# Patient Record
Sex: Female | Born: 1980 | Race: Black or African American | Hispanic: No | Smoking: Current every day smoker
Health system: Southern US, Community
[De-identification: ages and names within clinical notes are randomized; demographics above are authoritative.]

## PROBLEM LIST (undated history)

## (undated) DIAGNOSIS — E039 Hypothyroidism, unspecified: Secondary | ICD-10-CM

## (undated) DIAGNOSIS — R51 Headache: Secondary | ICD-10-CM

## (undated) DIAGNOSIS — D509 Iron deficiency anemia, unspecified: Secondary | ICD-10-CM

## (undated) DIAGNOSIS — R519 Headache, unspecified: Secondary | ICD-10-CM

## (undated) DIAGNOSIS — I38 Endocarditis, valve unspecified: Secondary | ICD-10-CM

## (undated) HISTORY — DX: Endocarditis, valve unspecified: I38

## (undated) HISTORY — PX: CHOLECYSTECTOMY: SHX55

## (undated) HISTORY — DX: Headache, unspecified: R51.9

## (undated) HISTORY — DX: Headache: R51

---

## 2004-01-02 HISTORY — PX: TUBAL LIGATION: SHX77

## 2010-10-20 ENCOUNTER — Emergency Department: Payer: Self-pay | Admitting: Emergency Medicine

## 2011-01-27 ENCOUNTER — Emergency Department: Payer: Self-pay | Admitting: Unknown Physician Specialty

## 2011-01-28 LAB — URINALYSIS, COMPLETE
Bacteria: NONE SEEN
Nitrite: NEGATIVE
Protein: NEGATIVE
Specific Gravity: 1.029 (ref 1.003–1.030)
Squamous Epithelial: 14

## 2011-01-28 LAB — CBC
HGB: 13.9 g/dL (ref 12.0–16.0)
MCHC: 32.8 g/dL (ref 32.0–36.0)
WBC: 7.9 10*3/uL (ref 3.6–11.0)

## 2011-01-28 LAB — COMPREHENSIVE METABOLIC PANEL
Albumin: 4 g/dL (ref 3.4–5.0)
Alkaline Phosphatase: 87 U/L (ref 50–136)
Calcium, Total: 9.2 mg/dL (ref 8.5–10.1)
Co2: 27 mmol/L (ref 21–32)
Creatinine: 0.79 mg/dL (ref 0.60–1.30)
EGFR (Non-African Amer.): >60
Glucose: 86 mg/dL (ref 65–99)
Osmolality: 286 (ref 275–301)
SGPT (ALT): 18 U/L
Sodium: 144 mmol/L (ref 136–145)

## 2011-01-28 LAB — HCG, QUANTITATIVE, PREGNANCY: Beta Hcg, Quant.: <1 m[IU]/mL — ABNORMAL LOW

## 2011-02-08 ENCOUNTER — Emergency Department (HOSPITAL_COMMUNITY)
Admission: EM | Admit: 2011-02-08 | Discharge: 2011-02-09 | Disposition: A | Discharge status: Home / Self Care | Payer: Medicaid Other | Attending: Emergency Medicine | Admitting: Emergency Medicine

## 2011-02-08 ENCOUNTER — Encounter (HOSPITAL_COMMUNITY): Payer: Self-pay | Admitting: Emergency Medicine

## 2011-02-08 DIAGNOSIS — K5900 Constipation, unspecified: Secondary | ICD-10-CM | POA: Insufficient documentation

## 2011-02-08 DIAGNOSIS — S40029A Contusion of unspecified upper arm, initial encounter: Secondary | ICD-10-CM | POA: Insufficient documentation

## 2011-02-08 DIAGNOSIS — N949 Unspecified condition associated with female genital organs and menstrual cycle: Secondary | ICD-10-CM | POA: Insufficient documentation

## 2011-02-08 DIAGNOSIS — R300 Dysuria: Secondary | ICD-10-CM | POA: Insufficient documentation

## 2011-02-08 DIAGNOSIS — X58XXXA Exposure to other specified factors, initial encounter: Secondary | ICD-10-CM | POA: Insufficient documentation

## 2011-02-08 DIAGNOSIS — R1032 Left lower quadrant pain: Secondary | ICD-10-CM | POA: Insufficient documentation

## 2011-02-08 DIAGNOSIS — N898 Other specified noninflammatory disorders of vagina: Secondary | ICD-10-CM | POA: Insufficient documentation

## 2011-02-08 NOTE — ED Notes (Signed)
Pt alert, nad, c/o gen abd pain, onset 1 month ago, pt denies n/v or diarrhea, recent constipation, denies urinary c/o, denies bleeding or discharge

## 2011-02-08 NOTE — ED Notes (Signed)
Pt states being treated for bacterial vaginosis

## 2011-02-09 LAB — CBC
Hemoglobin: 13.1 g/dL (ref 12.0–15.0)
MCH: 29 pg (ref 26.0–34.0)
MCV: 86.5 fL (ref 78.0–100.0)
RBC: 4.52 MIL/uL (ref 3.87–5.11)

## 2011-02-09 LAB — GC/CHLAMYDIA PROBE AMP, GENITAL: Chlamydia, DNA Probe: NEGATIVE

## 2011-02-09 LAB — DIFFERENTIAL
Eosinophils Absolute: 0.1 10*3/uL (ref 0.0–0.7)
Eosinophils Relative: 2 % (ref 0–5)
Lymphs Abs: 2.8 10*3/uL (ref 0.7–4.0)
Monocytes Relative: 7 % (ref 3–12)
Neutrophils Relative %: 56 % (ref 43–77)

## 2011-02-09 LAB — WET PREP, GENITAL

## 2011-02-09 LAB — URINALYSIS, ROUTINE W REFLEX MICROSCOPIC
Glucose, UA: NEGATIVE mg/dL
Hgb urine dipstick: NEGATIVE
Specific Gravity, Urine: 1.038 — ABNORMAL HIGH (ref 1.005–1.030)
pH: 6 (ref 5.0–8.0)

## 2011-02-09 LAB — POCT I-STAT, CHEM 8
Calcium, Ion: 1.1 mmol/L — ABNORMAL LOW (ref 1.12–1.32)
Chloride: 108 mEq/L (ref 96–112)
HCT: 39 % (ref 36.0–46.0)
Hemoglobin: 13.3 g/dL (ref 12.0–15.0)

## 2011-02-09 LAB — URINE MICROSCOPIC-ADD ON

## 2011-02-09 LAB — POCT PREGNANCY, URINE: Preg Test, Ur: NEGATIVE

## 2011-02-09 MED ORDER — SODIUM CHLORIDE 0.9 % IV SOLN: Freq: Once | INTRAVENOUS | Status: DC

## 2011-02-09 NOTE — ED Notes (Signed)
Patient is alert and oriented x3.  She was given DC instructions and follow up visit instructions.  Patient gave verbal understanding. She was DC ambulatory under his own power to home.  V/S stable.  He was not showing any signs of distress on DC 

## 2011-02-09 NOTE — ED Provider Notes (Signed)
History     CSN: [id number]  Arrival date & time 02/08/11  2337   First MD Initiated Contact with Patient 02/09/11 0018      Chief Complaint  Patient presents with  . Abdominal Pain    (Consider location/radiation/quality/duration/timing/severity/associated sxs/prior treatment) HPI Comments: Patient has had left lower quadrant pain for over a month.  She was seen at [facility] had lab work drawn and was discharged home without a definitive diagnosis.  She followed up at [facility].  One week later she had ultrasound, lab work urine, which revealed she had a left ovarian cyst.  She was referred to OB/GYN antidotal clinic in [place].  She has not followed up.  She has not taken any over-the-counter medications on regular basis.  She is concerned now because she is still having pain.  She has not had a menstrual cycle since December and location where she had blood drawn on Wednesday in her left upper arm,  now has a hematoma  Patient is a 31 y.o. female presenting with abdominal pain. The history is provided by the patient.  Abdominal Pain The primary symptoms of the illness include abdominal pain and dysuria. The primary symptoms of the illness do not include fever, nausea, vomiting or vaginal discharge. The current episode started more than 2 days ago. The onset of the illness was gradual.  The dysuria is not associated with hematuria, frequency or urgency.  Additional symptoms associated with the illness include constipation. Symptoms associated with the illness do not include chills, urgency, hematuria, frequency or back pain.    History reviewed. No pertinent past medical history.  Past Surgical History  Procedure Date  . Tubal ligation   . Cholecystectomy     No family history on file.  History  Substance Use Topics  . Smoking status: Current Everyday Smoker -- 1.0 packs/day    Types: Cigarettes  . Smokeless tobacco: Not on file  . Alcohol Use: No    OB  History    Grav Para Term Preterm Abortions TAB SAB Ect Mult Living                  Review of Systems  Constitutional: Negative for fever and chills.  Cardiovascular: Negative for palpitations and leg swelling.  Gastrointestinal: Positive for abdominal pain and constipation. Negative for nausea and vomiting.  Genitourinary: Positive for dysuria. Negative for urgency, frequency, hematuria, flank pain and vaginal discharge.  Musculoskeletal: Negative for back pain.  Skin: Negative for wound.  Neurological: Negative for dizziness, weakness and headaches.    Allergies  Review of patient's allergies indicates no known allergies.  Home Medications   Current Outpatient Rx  Name Route Sig Dispense Refill  . METRONIDAZOLE 500 MG PO TABS Oral Take 500 mg by mouth 2 (two) times daily. For seven days.      BP 118/82  Pulse 84  Temp(Src) 98 F (36.7 C) (Oral)  Resp 16  Wt 180 lb (81.647 kg)  SpO2 99%  LMP 12/22/2010  Physical Exam  Constitutional: She is oriented to person, place, and time. She appears well-developed and well-nourished.  Eyes: Pupils are equal, round, and reactive to light.  Neck: Normal range of motion.  Cardiovascular: Normal rate.   Pulmonary/Chest: Effort normal.  Abdominal: Soft. Bowel sounds are normal. She exhibits no distension. There is no tenderness.  Genitourinary: Uterus normal. Right adnexum displays tenderness. Right adnexum displays no mass. Left adnexum displays tenderness. Left adnexum displays no mass. Vaginal discharge found.  Lymphadenopathy:       Right: No inguinal adenopathy present.       Left: No inguinal adenopathy present.  Neurological: She is alert and oriented to person, place, and time.  Skin: Skin is warm and dry.       Superficial bruising medial aspect, left upper arm.  No hematoma felt under the bruise    ED Course  Procedures (including critical care time)  Labs Reviewed  URINALYSIS, ROUTINE W REFLEX MICROSCOPIC -  Abnormal; Notable for the following:    Specific Gravity, Urine 1.038 (*)    Bilirubin Urine SMALL (*)    Ketones, ur TRACE (*)    Leukocytes, UA SMALL (*)    All other components within normal limits  WET PREP, GENITAL - Abnormal; Notable for the following:    Clue Cells Wet Prep HPF POC FEW (*)    WBC, Wet Prep HPF POC FEW (*)    All other components within normal limits  POCT I-STAT, CHEM 8 - Abnormal; Notable for the following:    Calcium, Ion 1.10 (*)    All other components within normal limits  CBC  DIFFERENTIAL  POCT PREGNANCY, URINE  URINE MICROSCOPIC-ADD ON  GC/CHLAMYDIA PROBE AMP, GENITAL   No results found.   1. Vaginal Discharge   2. Abdominal pain       MDM  During her pelvic exam, she informs me that she was treated for a positive gonorrhea test with an injection of Rocephin and by mouth azithromycin.  Last Wednesday.  She is also currently taking Flagyl for B Jene Every, NP 02/09/11 0339  Arman Filter, NP 02/09/11 [phone]  Medical screening examination/treatment/procedure(s) were performed by non-physician practitioner and as supervising physician I was immediately available for consultation/collaboration.   Sunnie Nielsen, MD 02/09/11 [phone]

## 2011-02-09 NOTE — ED Notes (Signed)
Pt c/o low abd pain, pt states she has been seen x 2 for same, seen by [facility] and [facility]. Pt dx with ovarian cyst. Pt continues to have pain.

## 2011-04-10 ENCOUNTER — Ambulatory Visit: Payer: Self-pay | Admitting: Internal Medicine

## 2011-04-10 LAB — LIPID PANEL
Cholesterol: 183 mg/dL (ref 0–200)
HDL Cholesterol: 58 mg/dL (ref 40–60)
Ldl Cholesterol, Calc: 102 mg/dL — ABNORMAL HIGH (ref 0–100)
Triglycerides: 114 mg/dL (ref 0–200)
VLDL Cholesterol, Calc: 23 mg/dL (ref 5–40)

## 2011-04-10 LAB — URINALYSIS, COMPLETE
Bacteria: NONE SEEN
Bilirubin,UR: NEGATIVE
Glucose,UR: NEGATIVE mg/dL (ref 0–75)
Ketone: NEGATIVE
Leukocyte Esterase: NEGATIVE
Nitrite: NEGATIVE
Ph: 7 (ref 4.5–8.0)
Protein: NEGATIVE
RBC,UR: 1 /HPF (ref 0–5)
Specific Gravity: 1.004 (ref 1.003–1.030)
Squamous Epithelial: 3
WBC UR: <1 /HPF (ref 0–5)

## 2011-04-10 LAB — COMPREHENSIVE METABOLIC PANEL
Albumin: 3.8 g/dL (ref 3.4–5.0)
Alkaline Phosphatase: 103 U/L (ref 50–136)
Anion Gap: 9 (ref 7–16)
BUN: 9 mg/dL (ref 7–18)
Bilirubin,Total: 0.3 mg/dL (ref 0.2–1.0)
Calcium, Total: 9 mg/dL (ref 8.5–10.1)
Chloride: 111 mmol/L — ABNORMAL HIGH (ref 98–107)
Co2: 24 mmol/L (ref 21–32)
Creatinine: 0.92 mg/dL (ref 0.60–1.30)
EGFR (African American): >60
EGFR (Non-African Amer.): >60
Glucose: 93 mg/dL (ref 65–99)
Osmolality: 285 (ref 275–301)
Potassium: 3.8 mmol/L (ref 3.5–5.1)
SGOT(AST): 20 U/L (ref 15–37)
SGPT (ALT): 20 U/L
Sodium: 144 mmol/L (ref 136–145)
Total Protein: 7.9 g/dL (ref 6.4–8.2)

## 2011-04-10 LAB — TSH: Thyroid Stimulating Horm: 0.693 u[IU]/mL

## 2011-04-10 LAB — CBC WITH DIFFERENTIAL/PLATELET
Basophil #: 0 10*3/uL (ref 0.0–0.1)
Basophil %: 0.2 %
Eosinophil #: 0.1 10*3/uL (ref 0.0–0.7)
Eosinophil %: 1 %
HCT: 40.7 % (ref 35.0–47.0)
HGB: 13.4 g/dL (ref 12.0–16.0)
Lymphocyte #: 1.8 10*3/uL (ref 1.0–3.6)
Lymphocyte %: 26.2 %
MCH: 29.6 pg (ref 26.0–34.0)
MCHC: 33 g/dL (ref 32.0–36.0)
MCV: 90 fL (ref 80–100)
Monocyte #: 0.4 10*3/uL (ref 0.0–0.7)
Monocyte %: 5.1 %
Neutrophil #: 4.6 10*3/uL (ref 1.4–6.5)
Neutrophil %: 67.5 %
Platelet: 179 10*3/uL (ref 150–440)
RBC: 4.53 10*6/uL (ref 3.80–5.20)
RDW: 13.9 % (ref 11.5–14.5)
WBC: 6.9 10*3/uL (ref 3.6–11.0)

## 2011-05-09 ENCOUNTER — Ambulatory Visit: Payer: Self-pay | Admitting: Internal Medicine

## 2012-02-29 ENCOUNTER — Emergency Department: Payer: Self-pay | Admitting: Emergency Medicine

## 2012-02-29 LAB — COMPREHENSIVE METABOLIC PANEL
Albumin: 3.8 g/dL (ref 3.4–5.0)
Calcium, Total: 8.5 mg/dL (ref 8.5–10.1)
Co2: 28 mmol/L (ref 21–32)
EGFR (African American): >60
EGFR (Non-African Amer.): >60
Glucose: 91 mg/dL (ref 65–99)
Osmolality: 280 (ref 275–301)
SGOT(AST): 23 U/L (ref 15–37)
SGPT (ALT): 21 U/L (ref 12–78)
Total Protein: 8.1 g/dL (ref 6.4–8.2)

## 2012-02-29 LAB — LIPASE, BLOOD: Lipase: 188 U/L (ref 73–393)

## 2012-02-29 LAB — CBC
MCHC: 33.4 g/dL (ref 32.0–36.0)
Platelet: 219 10*3/uL (ref 150–440)
RBC: 4.75 10*6/uL (ref 3.80–5.20)
RDW: 13.7 % (ref 11.5–14.5)

## 2012-03-01 LAB — URINALYSIS, COMPLETE
Blood: NEGATIVE
Ph: 7 (ref 4.5–8.0)
Protein: NEGATIVE
Specific Gravity: 1.039 (ref 1.003–1.030)
Squamous Epithelial: 19

## 2012-03-01 LAB — PREGNANCY, URINE: Pregnancy Test, Urine: NEGATIVE m[IU]/mL

## 2012-07-29 ENCOUNTER — Emergency Department: Payer: Self-pay | Admitting: Emergency Medicine

## 2012-09-11 ENCOUNTER — Ambulatory Visit: Payer: Self-pay | Admitting: Internal Medicine

## 2013-03-10 ENCOUNTER — Emergency Department: Payer: Self-pay | Admitting: Emergency Medicine

## 2013-03-10 LAB — URINALYSIS, COMPLETE
Bilirubin,UR: NEGATIVE
Blood: NEGATIVE
GLUCOSE, UR: NEGATIVE mg/dL (ref 0–75)
Ketone: NEGATIVE
Leukocyte Esterase: NEGATIVE
NITRITE: NEGATIVE
PROTEIN: NEGATIVE
Ph: 6 (ref 4.5–8.0)
SPECIFIC GRAVITY: 1.027 (ref 1.003–1.030)
WBC UR: 5 /HPF (ref 0–5)

## 2013-03-10 LAB — CBC WITH DIFFERENTIAL/PLATELET
Basophil #: 0 10*3/uL (ref 0.0–0.1)
Basophil %: 0.5 %
EOS ABS: 0.1 10*3/uL (ref 0.0–0.7)
EOS PCT: 0.9 %
HCT: 40.8 % (ref 35.0–47.0)
HGB: 13.2 g/dL (ref 12.0–16.0)
LYMPHS PCT: 45.5 %
Lymphocyte #: 3.8 10*3/uL — ABNORMAL HIGH (ref 1.0–3.6)
MCH: 27.1 pg (ref 26.0–34.0)
MCHC: 32.4 g/dL (ref 32.0–36.0)
MCV: 84 fL (ref 80–100)
MONO ABS: 0.4 x10 3/mm (ref 0.2–0.9)
MONOS PCT: 5.1 %
NEUTROS ABS: 4 10*3/uL (ref 1.4–6.5)
Neutrophil %: 48 %
Platelet: 242 10*3/uL (ref 150–440)
RBC: 4.89 10*6/uL (ref 3.80–5.20)
RDW: 13.3 % (ref 11.5–14.5)
WBC: 8.4 10*3/uL (ref 3.6–11.0)

## 2013-03-10 LAB — COMPREHENSIVE METABOLIC PANEL
Albumin: 3.8 g/dL (ref 3.4–5.0)
Alkaline Phosphatase: 269 U/L — ABNORMAL HIGH
Anion Gap: 3 — ABNORMAL LOW (ref 7–16)
BUN: 11 mg/dL (ref 7–18)
Bilirubin,Total: 0.3 mg/dL (ref 0.2–1.0)
CALCIUM: 9.3 mg/dL (ref 8.5–10.1)
Chloride: 104 mmol/L (ref 98–107)
Co2: 29 mmol/L (ref 21–32)
Creatinine: 0.76 mg/dL (ref 0.60–1.30)
EGFR (Non-African Amer.): >60
Glucose: 88 mg/dL (ref 65–99)
Osmolality: 271 (ref 275–301)
POTASSIUM: 3.8 mmol/L (ref 3.5–5.1)
SGOT(AST): 16 U/L (ref 15–37)
SGPT (ALT): 20 U/L (ref 12–78)
Sodium: 136 mmol/L (ref 136–145)
TOTAL PROTEIN: 8.2 g/dL (ref 6.4–8.2)

## 2013-03-11 LAB — LIPASE, BLOOD: Lipase: 183 U/L (ref 73–393)

## 2013-04-23 ENCOUNTER — Other Ambulatory Visit (HOSPITAL_COMMUNITY): Payer: Self-pay | Admitting: *Deleted

## 2013-04-23 DIAGNOSIS — R609 Edema, unspecified: Secondary | ICD-10-CM

## 2013-04-27 ENCOUNTER — Ambulatory Visit: Payer: Self-pay | Admitting: Internal Medicine

## 2013-05-01 ENCOUNTER — Other Ambulatory Visit (INDEPENDENT_AMBULATORY_CARE_PROVIDER_SITE_OTHER): Payer: Medicaid Other

## 2013-05-01 ENCOUNTER — Other Ambulatory Visit: Payer: Self-pay

## 2013-05-01 DIAGNOSIS — R0602 Shortness of breath: Secondary | ICD-10-CM

## 2013-05-01 DIAGNOSIS — R609 Edema, unspecified: Secondary | ICD-10-CM

## 2013-05-01 DIAGNOSIS — R079 Chest pain, unspecified: Secondary | ICD-10-CM

## 2013-06-01 ENCOUNTER — Ambulatory Visit: Payer: Self-pay | Admitting: Gastroenterology

## 2014-01-16 ENCOUNTER — Emergency Department: Payer: Self-pay | Admitting: Emergency Medicine

## 2014-01-16 LAB — CBC
HCT: 38.5 % (ref 35.0–47.0)
HGB: 12.5 g/dL (ref 12.0–16.0)
MCH: 28.2 pg (ref 26.0–34.0)
MCHC: 32.5 g/dL (ref 32.0–36.0)
MCV: 87 fL (ref 80–100)
PLATELETS: 183 10*3/uL (ref 150–440)
RBC: 4.44 10*6/uL (ref 3.80–5.20)
RDW: 13.2 % (ref 11.5–14.5)
WBC: 5.6 10*3/uL (ref 3.6–11.0)

## 2014-01-16 LAB — COMPREHENSIVE METABOLIC PANEL
ALBUMIN: 3.3 g/dL — AB (ref 3.4–5.0)
ALT: 23 U/L
AST: 17 U/L (ref 15–37)
Alkaline Phosphatase: 151 U/L — ABNORMAL HIGH
Anion Gap: 5 — ABNORMAL LOW (ref 7–16)
BILIRUBIN TOTAL: 0.3 mg/dL (ref 0.2–1.0)
BUN: 13 mg/dL (ref 7–18)
Calcium, Total: 9.1 mg/dL (ref 8.5–10.1)
Chloride: 104 mmol/L (ref 98–107)
Co2: 27 mmol/L (ref 21–32)
Creatinine: 0.73 mg/dL (ref 0.60–1.30)
EGFR (African American): >60
EGFR (Non-African Amer.): >60
GLUCOSE: 111 mg/dL — AB (ref 65–99)
OSMOLALITY: 273 (ref 275–301)
POTASSIUM: 4.3 mmol/L (ref 3.5–5.1)
SODIUM: 136 mmol/L (ref 136–145)
TOTAL PROTEIN: 7.3 g/dL (ref 6.4–8.2)

## 2014-01-16 LAB — URINALYSIS, COMPLETE
BILIRUBIN, UR: NEGATIVE
BLOOD: NEGATIVE
Glucose,UR: NEGATIVE mg/dL (ref 0–75)
KETONE: NEGATIVE
Leukocyte Esterase: NEGATIVE
Nitrite: NEGATIVE
PH: 6 (ref 4.5–8.0)
PROTEIN: NEGATIVE
SPECIFIC GRAVITY: 1.015 (ref 1.003–1.030)
WBC UR: 1 /HPF (ref 0–5)

## 2014-01-16 LAB — LIPASE, BLOOD: LIPASE: 155 U/L (ref 73–393)

## 2014-01-16 LAB — GC/CHLAMYDIA PROBE AMP

## 2014-01-16 LAB — WET PREP, GENITAL

## 2014-01-20 ENCOUNTER — Emergency Department: Payer: Self-pay | Admitting: Emergency Medicine

## 2014-01-20 LAB — URINALYSIS, COMPLETE
BILIRUBIN, UR: NEGATIVE
Bacteria: NONE SEEN
Blood: NEGATIVE
Glucose,UR: NEGATIVE mg/dL (ref 0–75)
Leukocyte Esterase: NEGATIVE
Nitrite: NEGATIVE
PH: 5 (ref 4.5–8.0)
Protein: 30
RBC,UR: NONE SEEN /HPF (ref 0–5)
Specific Gravity: 1.031 (ref 1.003–1.030)
WBC UR: NONE SEEN /HPF (ref 0–5)

## 2014-01-20 LAB — COMPREHENSIVE METABOLIC PANEL
AST: 48 U/L — AB (ref 15–37)
Albumin: 3.7 g/dL (ref 3.4–5.0)
Alkaline Phosphatase: 180 U/L — ABNORMAL HIGH
Anion Gap: 7 (ref 7–16)
BUN: 11 mg/dL (ref 7–18)
Bilirubin,Total: 0.3 mg/dL (ref 0.2–1.0)
CHLORIDE: 104 mmol/L (ref 98–107)
CO2: 26 mmol/L (ref 21–32)
Calcium, Total: 9.4 mg/dL (ref 8.5–10.1)
Creatinine: 0.77 mg/dL (ref 0.60–1.30)
Glucose: 141 mg/dL — ABNORMAL HIGH (ref 65–99)
Osmolality: 276 (ref 275–301)
Potassium: 4.1 mmol/L (ref 3.5–5.1)
SGPT (ALT): 40 U/L
Sodium: 137 mmol/L (ref 136–145)
TOTAL PROTEIN: 8.1 g/dL (ref 6.4–8.2)

## 2014-01-20 LAB — CBC WITH DIFFERENTIAL/PLATELET
Basophil #: 0 10*3/uL (ref 0.0–0.1)
Basophil %: 0.2 %
EOS ABS: 0 10*3/uL (ref 0.0–0.7)
Eosinophil %: 0.4 %
HCT: 41.8 % (ref 35.0–47.0)
HGB: 13.6 g/dL (ref 12.0–16.0)
LYMPHS PCT: 39.2 %
Lymphocyte #: 2.3 10*3/uL (ref 1.0–3.6)
MCH: 28.1 pg (ref 26.0–34.0)
MCHC: 32.5 g/dL (ref 32.0–36.0)
MCV: 86 fL (ref 80–100)
MONOS PCT: 7.5 %
Monocyte #: 0.5 x10 3/mm (ref 0.2–0.9)
NEUTROS PCT: 52.7 %
Neutrophil #: 3.2 10*3/uL (ref 1.4–6.5)
PLATELETS: 217 10*3/uL (ref 150–440)
RBC: 4.85 10*6/uL (ref 3.80–5.20)
RDW: 13.1 % (ref 11.5–14.5)
WBC: 6 10*3/uL (ref 3.6–11.0)

## 2014-02-11 ENCOUNTER — Ambulatory Visit: Payer: Self-pay | Admitting: Internal Medicine

## 2014-06-29 ENCOUNTER — Encounter: Payer: Self-pay | Admitting: Urgent Care

## 2014-06-29 ENCOUNTER — Emergency Department
Admission: EM | Admit: 2014-06-29 | Discharge: 2014-06-30 | Disposition: A | Discharge status: Home / Self Care | Payer: Medicaid Other | Attending: Emergency Medicine | Admitting: Emergency Medicine

## 2014-06-29 DIAGNOSIS — Z79899 Other long term (current) drug therapy: Secondary | ICD-10-CM | POA: Insufficient documentation

## 2014-06-29 DIAGNOSIS — R609 Edema, unspecified: Secondary | ICD-10-CM | POA: Diagnosis not present

## 2014-06-29 DIAGNOSIS — Z720 Tobacco use: Secondary | ICD-10-CM | POA: Diagnosis not present

## 2014-06-29 DIAGNOSIS — M6282 Rhabdomyolysis: Secondary | ICD-10-CM | POA: Diagnosis not present

## 2014-06-29 DIAGNOSIS — M79601 Pain in right arm: Secondary | ICD-10-CM | POA: Diagnosis present

## 2014-06-29 DIAGNOSIS — M7989 Other specified soft tissue disorders: Secondary | ICD-10-CM

## 2014-06-29 HISTORY — DX: Hypothyroidism, unspecified: E03.9

## 2014-06-29 NOTE — ED Notes (Signed)
Patient presents with c/o RUE pain and swelling x 1 week. Pain is radicular in nature and ascends into shoulder area.

## 2014-06-30 ENCOUNTER — Encounter: Payer: Self-pay | Admitting: Emergency Medicine

## 2014-06-30 ENCOUNTER — Emergency Department: Payer: Medicaid Other

## 2014-06-30 LAB — COMPREHENSIVE METABOLIC PANEL
ALT: 18 U/L (ref 14–54)
AST: 25 U/L (ref 15–41)
Albumin: 4.5 g/dL (ref 3.5–5.0)
Alkaline Phosphatase: 119 U/L (ref 38–126)
Anion gap: 7 (ref 5–15)
BUN: 11 mg/dL (ref 6–20)
CALCIUM: 9 mg/dL (ref 8.9–10.3)
CO2: 26 mmol/L (ref 22–32)
CREATININE: 1.1 mg/dL — AB (ref 0.44–1.00)
Chloride: 105 mmol/L (ref 101–111)
GFR calc Af Amer: >60 mL/min (ref >60)
GLUCOSE: 97 mg/dL (ref 65–99)
Potassium: 3.6 mmol/L (ref 3.5–5.1)
SODIUM: 138 mmol/L (ref 135–145)
Total Bilirubin: 0.4 mg/dL (ref 0.3–1.2)
Total Protein: 7.8 g/dL (ref 6.5–8.1)

## 2014-06-30 LAB — CBC
HCT: 39 % (ref 35.0–47.0)
Hemoglobin: 12.7 g/dL (ref 12.0–16.0)
MCH: 28.4 pg (ref 26.0–34.0)
MCHC: 32.7 g/dL (ref 32.0–36.0)
MCV: 86.8 fL (ref 80.0–100.0)
Platelets: 181 10*3/uL (ref 150–440)
RBC: 4.49 MIL/uL (ref 3.80–5.20)
RDW: 18.6 % — ABNORMAL HIGH (ref 11.5–14.5)
WBC: 7.4 10*3/uL (ref 3.6–11.0)

## 2014-06-30 LAB — CK: CK TOTAL: 584 U/L — AB (ref 38–234)

## 2014-06-30 MED ORDER — PREDNISONE 20 MG PO TABS: 60.0000 mg | ORAL_TABLET | Freq: Every day | ORAL | Status: AC

## 2014-06-30 MED ORDER — OXYCODONE-ACETAMINOPHEN 5-325 MG PO TABS
1.0000 | ORAL_TABLET | Freq: Once | ORAL | Status: AC
  Administered 2014-06-30: 1 via ORAL

## 2014-06-30 MED ORDER — OXYCODONE-ACETAMINOPHEN 5-325 MG PO TABS
ORAL_TABLET | ORAL | Status: AC
  Administered 2014-06-30: 1 via ORAL
  Filled 2014-06-30: qty 1

## 2014-06-30 NOTE — Discharge Instructions (Signed)
Edema °Edema is an abnormal buildup of fluids in your body tissues. Edema is somewhat dependent on gravity to pull the fluid to the lowest place in your body. That makes the condition more common in the legs and thighs (lower extremities). Painless swelling of the feet and ankles is common and becomes more likely as you get older. It is also common in looser tissues, like around your eyes.  °When the affected area is squeezed, the fluid may move out of that spot and leave a dent for a few moments. This dent is called pitting.  °CAUSES  °There are many possible causes of edema. Eating too much salt and being on your feet or sitting for a long time can cause edema in your legs and ankles. Hot weather may make edema worse. Common medical causes of edema include: °· Heart failure. °· Liver disease. °· Kidney disease. °· Weak blood vessels in your legs. °· Cancer. °· An injury. °· Pregnancy. °· Some medications. °· Obesity.  °SYMPTOMS  °Edema is usually painless. Your skin may look swollen or shiny.  °DIAGNOSIS  °Your health care provider may be able to diagnose edema by asking about your medical history and doing a physical exam. You may need to have tests such as X-rays, an electrocardiogram, or blood tests to check for medical conditions that may cause edema.  °TREATMENT  °Edema treatment depends on the cause. If you have heart, liver, or kidney disease, you need the treatment appropriate for these conditions. General treatment may include: °· Elevation of the affected body part above the level of your heart. °· Compression of the affected body part. Pressure from elastic bandages or support stockings squeezes the tissues and forces fluid back into the blood vessels. This keeps fluid from entering the tissues. °· Restriction of fluid and salt intake. °· Use of a water pill (diuretic). These medications are appropriate only for some types of edema. They pull fluid out of your body and make you urinate more often. This  gets rid of fluid and reduces swelling, but diuretics can have side effects. Only use diuretics as directed by your health care provider. °HOME CARE INSTRUCTIONS  °· Keep the affected body part above the level of your heart when you are lying down.   °· Do not sit still or stand for prolonged periods.   °· Do not put anything directly under your knees when lying down. °· Do not wear constricting clothing or garters on your upper legs.   °· Exercise your legs to work the fluid back into your blood vessels. This may help the swelling go down.   °· Wear elastic bandages or support stockings to reduce ankle swelling as directed by your health care provider.   °· Eat a low-salt diet to reduce fluid if your health care provider recommends it.   °· Only take medicines as directed by your health care provider.  °SEEK MEDICAL CARE IF:  °· Your edema is not responding to treatment. °· You have heart, liver, or kidney disease and notice symptoms of edema. °· You have edema in your legs that does not improve after elevating them.   °· You have sudden and unexplained weight gain. °SEEK IMMEDIATE MEDICAL CARE IF:  °· You develop shortness of breath or chest pain.   °· You cannot breathe when you lie down. °· You develop pain, redness, or warmth in the swollen areas.   °· You have heart, liver, or kidney disease and suddenly get edema. °· You have a fever and your symptoms suddenly get worse. °MAKE SURE YOU:  °·   Understand these instructions.  Will watch your condition.  Will get help right away if you are not doing well or get worse. Document Released: 12/18/2004 Document Revised: 05/04/2013 Document Reviewed: 10/10/2012 [facility] Patient Information 2015 [place], [place]. This information is not intended to replace advice given to you by your health care provider. Make sure you discuss any questions you have with your health care provider.  Rhabdomyolysis Rhabdomyolysis is the breakdown of muscle fibers due to injury. The  injury may come from physical damage to the muscle like an injury but other causes are:  High fever (hyperthermia).  Seizures (convulsions).  Low phosphate levels.  Diseases of metabolism.  Heatstroke.  Drug toxicity.  Over exertion.  Alcoholism.  Muscle is cut off from oxygen (anoxia).  The squeezing of nerves and blood vessels (compartment syndrome). Some drugs which may cause the breakdown of muscle are:  Antibiotics.  Statins.  Alcohol.  Animal toxins. Myoglobin is a substance which helps muscle use oxygen. When the muscle is damaged, the myoglobin is released into the bloodstream. It is filtered out of the bloodstream by the kidneys. Myoglobin may block up the kidneys. This may cause damage, such as kidney failure. It also breaks down into other damaging toxic parts, which also cause kidney failure.  SYMPTOMS   Dark, red, or tea colored urine.  Weakness of affected muscles.  Weight gain from water retention.  Joint aches and pains.  Irregular heart from high potassium in the blood.  Muscle tenderness or aching.  Generalized weakness.  Seizures.  Feeling tired (fatigue). DIAGNOSIS  Your caregiver may find muscle tenderness on exam and suspect the problem. Urine tests and blood work can confirm the problem. TREATMENT   Early and aggressive treatment with large amounts of fluids may help prevent kidney failure.  Water producing medicine (diuretic) may be used to help flush the kidneys.  High potassium and calcium problems (electrolyte) in your blood may need treatment. HOME CARE INSTRUCTIONS  This problem is usually cared for in a hospital. If you are allowed to go home and require dialysis, make sure you keep all appointments for lab work and dialysis. Not doing so could result in death. Document Released: 12/01/2003 Document Revised: 03/12/2011 Document Reviewed: 06/14/2008 [facility]ExitCare Patient Information 2015 [place], [place]. This information is not  intended to replace advice given to you by your health care provider. Make sure you discuss any questions you have with your health care provider.  Peripheral Edema You have swelling in your legs (peripheral edema). This swelling is due to excess accumulation of salt and water in your body. Edema may be a sign of heart, kidney or liver disease, or a side effect of a medication. It may also be due to problems in the leg veins. Elevating your legs and using special support stockings may be very helpful, if the cause of the swelling is due to poor venous circulation. Avoid long periods of standing, whatever the cause. Treatment of edema depends on identifying the cause. Chips, pretzels, pickles and other salty foods should be avoided. Restricting salt in your diet is almost always needed. Water pills (diuretics) are often used to remove the excess salt and water from your body via urine. These medicines prevent the kidney from reabsorbing sodium. This increases urine flow. Diuretic treatment may also result in lowering of potassium levels in your body. Potassium supplements may be needed if you have to use diuretics daily. Daily weights can help you keep track of your progress in clearing your  edema. You should call your caregiver for follow up care as recommended. SEEK IMMEDIATE MEDICAL CARE IF:   You have increased swelling, pain, redness, or heat in your legs.  You develop shortness of breath, especially when lying down.  You develop chest or abdominal pain, weakness, or fainting.  You have a fever. Document Released: 01/26/2004 Document Revised: 03/12/2011 Document Reviewed: 01/05/2009 [facility] Patient Information 2015 [place], [place]. This information is not intended to replace advice given to you by your health care provider. Make sure you discuss any questions you have with your health care provider.

## 2014-06-30 NOTE — ED Notes (Signed)
Pt uprite on stretcher with no distress noted; pt reports sudden onset pain to right arm tonight; denies any injury; st "for awhile" has had swelling noted to that arm, as well as swelling to LE(s); denies SOB or CP; +smoker, no BCP or hormonal treatment; st has appt in morning with PCP to evaluate swelling but when pain began tonight became more concerned; +/strong periph pulses noted; swelling noted to right arm and feet/ankles

## 2014-06-30 NOTE — ED Provider Notes (Signed)
[facility] Emergency Department Provider Note  ____________________________________________  Time seen: 12:05 AM  I have reviewed the triage vital signs and the nursing notes.   HISTORY  Chief Complaint Arm Pain      HPI Kari Gallagher is a 34 y.o. female presents with acute onset at 9:30 PM of right arm pain and swelling for "a while". Patient states she's had swelling in her bilateral lower extremity is but now has pain and swelling in her right arm.     Past Medical History  Diagnosis Date  . Hypothyroidism     There are no active problems to display for this patient.   Past Surgical History  Procedure Laterality Date  . Tubal ligation    . Cholecystectomy      Current Outpatient Rx  Name  Route  Sig  Dispense  Refill  . levothyroxine (SYNTHROID, LEVOTHROID) 100 MCG tablet   Oral   Take 100 mcg by mouth daily before breakfast.         . metroNIDAZOLE (FLAGYL) 500 MG tablet   Oral   Take 500 mg by mouth 2 (two) times daily. For seven days.           Allergies Review of patient's allergies indicates no known allergies.  No family history on file.  Social History History  Substance Use Topics  . Smoking status: Current Every Day Smoker -- 1.00 packs/day    Types: Cigarettes  . Smokeless tobacco: Not on file  . Alcohol Use: No    Review of Systems  Constitutional: Negative for fever. Eyes: Negative for visual changes. ENT: Negative for sore throat. Cardiovascular: Negative for chest pain. Respiratory: Negative for shortness of breath. Gastrointestinal: Negative for abdominal pain, vomiting and diarrhea. Genitourinary: Negative for dysuria. Musculoskeletal: Negative for back pain. Skin: Negative for rash. Neurological: Negative for headaches, focal weakness or numbness.  10-point ROS otherwise negative.  ____________________________________________   PHYSICAL EXAM:  VITAL SIGNS: ED Triage Vitals  Enc  Vitals Group     BP 06/29/14 2314 130/85 mmHg     Pulse Rate 06/29/14 2314 65     Resp 06/29/14 2314 16     Temp 06/29/14 2314 98.6 F (37 C)     Temp Source 06/29/14 2314 Oral     SpO2 06/29/14 2314 100 %     Weight 06/29/14 2314 180 lb (81.647 kg)     Height 06/29/14 2314  (1.651 m)     Head Cir --      Peak Flow --      Pain Score 06/29/14 2316 8     Pain Loc --      Pain Edu? --      Excl. in GC? --      Constitutional: Alert and oriented. Well appearing and in no distress. Eyes: Conjunctivae are normal. PERRL. Normal extraocular movements. ENT   Head: Normocephalic and atraumatic.   Nose: No congestion/rhinnorhea.   Mouth/Throat: Mucous membranes are moist.   Neck: No stridor. Hematological/Lymphatic/Immunilogical: No cervical lymphadenopathy. Cardiovascular: Normal rate, regular rhythm. Normal and symmetric distal pulses are present in all extremities. No murmurs, rubs, or gallops. Respiratory: Normal respiratory effort without tachypnea nor retractions. Breath sounds are clear and equal bilaterally. No wheezes/rales/rhonchi. Gastrointestinal: Soft and nontender. No distention. There is no CVA tenderness. Genitourinary: deferred Musculoskeletal: Nontender with normal range of motion in all extremities. No joint effusions.  No lower extremity tenderness nor edema. Neurologic:  Normal speech and language. No gross  focal neurologic deficits are appreciated. Speech is normal.  Skin:  Skin is warm, dry and intact. No rash noted. Psychiatric: Mood and affect are normal. Speech and behavior are normal. Patient exhibits appropriate insight and judgment.  ____________________________________________    LABS (pertinent positives/negatives)  Labs Reviewed  CBC - Abnormal; Notable for the following:    RDW 18.6 (*)    All other components within normal limits  COMPREHENSIVE METABOLIC PANEL - Abnormal; Notable for the following:    Creatinine, Ser 1.10 (*)     All other components within normal limits  CK - Abnormal; Notable for the following:    Total CK 584 (*)    All other components within normal limits     ____________________________________________ _    RADIOLOGY  Ultrasound right upper extremity revealed no DVT  ____________________________________________     INITIAL IMPRESSION / ASSESSMENT AND PLAN / ED COURSE  Pertinent labs & imaging results that were available during my care of the patient were reviewed by me and considered in my medical decision making (see chart for details).  History physical exam and lab data concerning for rhabdomyolysis. Other potential etiologies include multiple sclerosis or systemic lupus erythematous. Patient advised to follow-up with primary care provider for appropriate outpatient evaluation  ____________________________________________   FINAL CLINICAL IMPRESSION(S) / ED DIAGNOSES  Final diagnoses:  Arm swelling  Non-traumatic rhabdomyolysis  Edema      Darci Currentandolph N Brown, MD 06/30/14 [phone]

## 2014-06-30 NOTE — ED Notes (Signed)
Patient in room, lying on bed with eyes closed; appears to be resting comfortably. Shows no s/sx of acute distress. Even, unlabored resp noted. Awaiting MD orders. Will continue to monitor. Family at bedside 

## 2014-06-30 NOTE — ED Notes (Signed)
Patient in room, lying on bed with eyes closed; appears to be resting comfortably.  Shows no s/sx of acute distress. Even, unlabored resp noted. Awaiting MD orders. Will continue to monitor. Family at bedisde

## 2014-06-30 NOTE — ED Notes (Signed)
Pt to u/s

## 2014-06-30 NOTE — ED Notes (Signed)
Patient in room, lying on bed with eyes closed; appears to be resting comfortably. Shows no s/sx of acute distress. Even, unlabored resp noted. Awaiting MD orders. Will continue to monitor. Family at bedside

## 2014-11-20 ENCOUNTER — Emergency Department: Payer: Medicaid Other

## 2014-11-20 ENCOUNTER — Emergency Department
Admission: EM | Admit: 2014-11-20 | Discharge: 2014-11-20 | Disposition: A | Discharge status: Home / Self Care | Payer: Medicaid Other | Attending: Emergency Medicine | Admitting: Emergency Medicine

## 2014-11-20 DIAGNOSIS — S5002XA Contusion of left elbow, initial encounter: Secondary | ICD-10-CM

## 2014-11-20 DIAGNOSIS — Y9389 Activity, other specified: Secondary | ICD-10-CM | POA: Insufficient documentation

## 2014-11-20 DIAGNOSIS — S79912A Unspecified injury of left hip, initial encounter: Secondary | ICD-10-CM | POA: Insufficient documentation

## 2014-11-20 DIAGNOSIS — Z79899 Other long term (current) drug therapy: Secondary | ICD-10-CM | POA: Diagnosis not present

## 2014-11-20 DIAGNOSIS — Y998 Other external cause status: Secondary | ICD-10-CM | POA: Diagnosis not present

## 2014-11-20 DIAGNOSIS — Y9289 Other specified places as the place of occurrence of the external cause: Secondary | ICD-10-CM | POA: Diagnosis not present

## 2014-11-20 DIAGNOSIS — S4992XA Unspecified injury of left shoulder and upper arm, initial encounter: Secondary | ICD-10-CM | POA: Insufficient documentation

## 2014-11-20 DIAGNOSIS — F17210 Nicotine dependence, cigarettes, uncomplicated: Secondary | ICD-10-CM | POA: Insufficient documentation

## 2014-11-20 DIAGNOSIS — Z3202 Encounter for pregnancy test, result negative: Secondary | ICD-10-CM | POA: Insufficient documentation

## 2014-11-20 DIAGNOSIS — S59902A Unspecified injury of left elbow, initial encounter: Secondary | ICD-10-CM | POA: Diagnosis present

## 2014-11-20 DIAGNOSIS — X58XXXA Exposure to other specified factors, initial encounter: Secondary | ICD-10-CM | POA: Insufficient documentation

## 2014-11-20 LAB — POCT PREGNANCY, URINE: Preg Test, Ur: NEGATIVE

## 2014-11-20 MED ORDER — IBUPROFEN 800 MG PO TABS: 800.0000 mg | ORAL_TABLET | Freq: Three times a day (TID) | ORAL | Status: DC | PRN

## 2014-11-20 MED ORDER — HYDROCODONE-ACETAMINOPHEN 5-325 MG PO TABS: 1.0000 | ORAL_TABLET | ORAL | Status: DC | PRN

## 2014-11-20 NOTE — ED Notes (Signed)
Pt ambulatory to triage with no difficulty. Pt reports she fell a few hours ago and landed on her left elbow and left hip. Pt reports pain and swelling to her elbow and pain to her hip.

## 2014-11-20 NOTE — ED Provider Notes (Signed)
[facility] Emergency Department Provider Note  ____________________________________________  Time seen: Approximately 7:14 AM  I have reviewed the triage vital signs and the nursing notes.   HISTORY  Chief Complaint Fall    HPI Kari Gallagher is a 34 y.o. female who fell prior to arrival to the emergency room complaining of left elbow pain and some mild left hip pain. Left hip pain is resolved upon exam. States pain is worse when trying to extend or move her forearm unable to do a rotating motion.Presently rates her pain as a 9/10.   Past Medical History  Diagnosis Date  . Hypothyroidism     There are no active problems to display for this patient.   Past Surgical History  Procedure Laterality Date  . Tubal ligation    . Cholecystectomy      Current Outpatient Rx  Name  Route  Sig  Dispense  Refill  . HYDROcodone-acetaminophen (NORCO) 5-325 MG tablet   Oral   Take 1-2 tablets by mouth every 4 (four) hours as needed for moderate pain.   12 tablet   0   . ibuprofen (ADVIL,MOTRIN) 800 MG tablet   Oral   Take 1 tablet (800 mg total) by mouth every 8 (eight) hours as needed.   30 tablet   0   . levothyroxine (SYNTHROID, LEVOTHROID) 100 MCG tablet   Oral   Take 100 mcg by mouth daily before breakfast.         . metroNIDAZOLE (FLAGYL) 500 MG tablet   Oral   Take 500 mg by mouth 2 (two) times daily. For seven days.           Allergies Review of patient's allergies indicates no known allergies.  No family history on file.  Social History Social History  Substance Use Topics  . Smoking status: Current Every Day Smoker -- 1.00 packs/day    Types: Cigarettes  . Smokeless tobacco: Not on file  . Alcohol Use: No    Review of Systems Constitutional: No fever/chills Eyes: No visual changes. ENT: No sore throat. Cardiovascular: Denies chest pain. Respiratory: Denies shortness of breath. Gastrointestinal: No abdominal pain.  No  nausea, no vomiting.  No diarrhea.  No constipation. Genitourinary: Negative for dysuria. Musculoskeletal: Positive for left shoulder pain. Skin: Negative for rash. Neurological: Negative for headaches, focal weakness or numbness.  10-point ROS otherwise negative.  ____________________________________________   PHYSICAL EXAM:  VITAL SIGNS: ED Triage Vitals  Enc Vitals Group     BP 11/20/14 0527 114/67 mmHg     Pulse Rate 11/20/14 0527 88     Resp 11/20/14 0527 18     Temp 11/20/14 0527 98.5 F (36.9 C)     Temp Source 11/20/14 0527 Oral     SpO2 11/20/14 0527 97 %     Weight 11/20/14 0527 159 lb (72.122 kg)     Height 11/20/14 0527  (1.651 m)     Head Cir --      Peak Flow --      Pain Score 11/20/14 0528 9     Pain Loc --      Pain Edu? --      Excl. in GC? --     Constitutional: Alert and oriented. Well appearing and in no acute distress.  Cardiovascular: Normal rate, regular rhythm. Grossly normal heart sounds.  Good peripheral circulation. Respiratory: Normal respiratory effort.  No retractions. Lungs CTAB. Musculoskeletal: Left elbow with full range of motion however increased  pain with point tenderness to lateral epicondyle area. Distally neurovascularly intact Neurologic:  Normal speech and language. No gross focal neurologic deficits are appreciated. No gait instability. Skin:  Skin is warm, dry and intact. No rash noted. Psychiatric: Mood and affect are normal. Speech and behavior are normal.  ____________________________________________   LABS (all labs ordered are listed, but only abnormal results are displayed)  Labs Reviewed  POC URINE PREG, ED  POCT PREGNANCY, URINE   ____________________________________________  RADIOLOGY  Left elbow with no acute fractures dislocations or effusions. ____________________________________________   PROCEDURES  Procedure(s) performed: None  Critical Care performed:  No  ____________________________________________   INITIAL IMPRESSION / ASSESSMENT AND PLAN / ED COURSE  Pertinent labs & imaging results that were available during my care of the patient were reviewed by me and considered in my medical decision making (see chart for details).  Acute left elbow her elbow contusion. Rx given for Motrin 800 mg 3 times a day and Norco 5/325. Sling provided for comfort. Patient follow-up with PCP or return to the ER if any worsening symptomology. Patient voices no other emergency medical complaints at this time. ____________________________________________   FINAL CLINICAL IMPRESSION(S) / ED DIAGNOSES  Final diagnoses:  Left elbow contusion, initial encounter      Evangeline DakinCharles M Beers, PA-C 11/20/14 0734  Myrna Blazeravid Matthew Schaevitz, MD 11/20/14 [phone]

## 2014-11-20 NOTE — Discharge Instructions (Signed)
Elbow Contusion °An elbow contusion is a deep bruise of the elbow. Contusions are the result of an injury that caused bleeding under the skin. The contusion may turn blue, purple, or yellow. Minor injuries will give you a painless contusion, but more severe contusions may stay painful and swollen for a few weeks.  °CAUSES  °An elbow contusion comes from a direct force to that area, such as falling on the elbow. °SYMPTOMS  °· Swelling and redness of the elbow. °· Bruising of the elbow area. °· Tenderness or soreness of the elbow. °DIAGNOSIS  °You will have a physical exam and will be asked about your history. You may need an X-ray of your elbow to look for a broken bone (fracture).  °TREATMENT  °A sling or splint may be needed to support your injury. Resting, elevating, and applying cold compresses to the elbow area are often the best treatments for an elbow contusion. Over-the-counter medicines may also be recommended for pain control. °HOME CARE INSTRUCTIONS  °· Put ice on the injured area. °¨ Put ice in a plastic bag. °¨ Place a towel between your skin and the bag. °¨ Leave the ice on for 15-20 minutes, 03-04 times a day. °· Only take over-the-counter or prescription medicines for pain, discomfort, or fever as directed by your caregiver. °· Rest your injured elbow until the pain and swelling are better. °· Elevate your elbow to reduce swelling. °· Apply a compression wrap as directed by your caregiver. This can help reduce swelling and motion. You may remove the wrap for sleeping, showers, and baths. If your fingers become numb, cold, or blue, take the wrap off and reapply it more loosely. °· Use your elbow only as directed by your caregiver. You may be asked to do range of motion exercises. Do them as directed. °· See your caregiver as directed. It is very important to keep all follow-up appointments in order to avoid any long-term problems with your elbow, including chronic pain or inability to move your elbow  normally. °SEEK IMMEDIATE MEDICAL CARE IF:  °· You have increased redness, swelling, or pain in your elbow. °· Your swelling or pain is not relieved with medicines. °· You have swelling of the hand and fingers. °· You are unable to move your fingers or wrist. °· You begin to lose feeling in your hand or fingers. °· Your fingers or hand become cold or blue. °MAKE SURE YOU:  °· Understand these instructions. °· Will watch your condition. °· Will get help right away if you are not doing well or get worse. °  °This information is not intended to replace advice given to you by your health care provider. Make sure you discuss any questions you have with your health care provider. °  °Document Released: 11/26/2005 Document Revised: 03/12/2011 Document Reviewed: 08/02/2014 °Elsevier Interactive Patient Education ©2016 [employer] ° °

## 2015-06-09 ENCOUNTER — Emergency Department: Payer: Medicaid Other

## 2015-06-09 DIAGNOSIS — F17210 Nicotine dependence, cigarettes, uncomplicated: Secondary | ICD-10-CM | POA: Insufficient documentation

## 2015-06-09 DIAGNOSIS — Z79899 Other long term (current) drug therapy: Secondary | ICD-10-CM | POA: Diagnosis not present

## 2015-06-09 DIAGNOSIS — R51 Headache: Secondary | ICD-10-CM | POA: Diagnosis not present

## 2015-06-09 DIAGNOSIS — E039 Hypothyroidism, unspecified: Secondary | ICD-10-CM | POA: Diagnosis not present

## 2015-06-09 NOTE — ED Notes (Addendum)
Pt in with co headache for a month saw pmd and was told it was stress related. Pt states it is worst headache she has ever had, has taken otc without relief.

## 2015-06-10 ENCOUNTER — Emergency Department
Admission: EM | Admit: 2015-06-10 | Discharge: 2015-06-10 | Disposition: A | Discharge status: Home / Self Care | Payer: Medicaid Other | Attending: Emergency Medicine | Admitting: Emergency Medicine

## 2015-06-10 DIAGNOSIS — R51 Headache: Secondary | ICD-10-CM

## 2015-06-10 DIAGNOSIS — R519 Headache, unspecified: Secondary | ICD-10-CM

## 2015-06-10 MED ORDER — KETOROLAC TROMETHAMINE 10 MG PO TABS: 10.0000 mg | ORAL_TABLET | Freq: Three times a day (TID) | ORAL | Status: DC | PRN

## 2015-06-10 MED ORDER — KETOROLAC TROMETHAMINE 10 MG PO TABS: 10.0000 mg | ORAL_TABLET | Freq: Once | ORAL | Status: DC

## 2015-06-10 NOTE — ED Provider Notes (Signed)
[facility] Emergency Department Provider Note  ____________________________________________  Time seen: 1:30 AM  I have reviewed the triage vital signs and the nursing notes.   HISTORY  Chief Complaint Headache      HPI Kari Gallagher is a 35 y.o. female presents with intermittent headaches times one month. Patient states that she saw her primary care provider who informed her that the headaches are secondary to increased stress. Patient admits to headache today that is currently 5 out of 10. Patient denies taking any medication for the pain at home. Patient denies any weakness numbness or gait instability. Patient denies any nausea. Patient admits to family history of migraines however denies any history of aneurysms.    Past Medical History  Diagnosis Date  . Hypothyroidism     There are no active problems to display for this patient.   Past Surgical History  Procedure Laterality Date  . Tubal ligation    . Cholecystectomy      Current Outpatient Rx  Name  Route  Sig  Dispense  Refill  . levothyroxine (SYNTHROID, LEVOTHROID) 100 MCG tablet   Oral   Take 100 mcg by mouth daily before breakfast.         . ketorolac (TORADOL) 10 MG tablet   Oral   Take 1 tablet (10 mg total) by mouth every 8 (eight) hours as needed.   20 tablet   0     Allergies No known drug allergies No family history on file.  Social History Social History  Substance Use Topics  . Smoking status: Current Every Day Smoker -- 1.00 packs/day    Types: Cigarettes  . Smokeless tobacco: Not on file  . Alcohol Use: No    Review of Systems  Constitutional: Negative for fever. Eyes: Negative for visual changes. ENT: Negative for sore throat. Cardiovascular: Negative for chest pain. Respiratory: Negative for shortness of breath. Gastrointestinal: Negative for abdominal pain, vomiting and diarrhea. Genitourinary: Negative for dysuria. Musculoskeletal: Negative  for back pain. Skin: Negative for rash. Neurological: Positive for headache  10-point ROS otherwise negative.  ____________________________________________   PHYSICAL EXAM:  VITAL SIGNS: ED Triage Vitals  Enc Vitals Group     BP 06/09/15 2211 124/78 mmHg     Pulse Rate 06/09/15 2211 81     Resp 06/09/15 2211 18     Temp 06/09/15 2211 98.1 F (36.7 C)     Temp Source 06/09/15 2211 Oral     SpO2 06/09/15 2211 99 %     Weight 06/09/15 2211 170 lb (77.111 kg)     Height 06/09/15 2211 5\' 5"  (1.651 m)     Head Cir --      Peak Flow --      Pain Score 06/09/15 2212 7     Pain Loc --      Pain Edu? --      Excl. in GC? --      Constitutional: Alert and oriented. Well appearing and in no distress.Asleep but easily arousable Eyes: Conjunctivae are normal. PERRL. Normal extraocular movements. ENT   Head: Normocephalic and atraumatic.   Nose: No congestion/rhinnorhea.   Mouth/Throat: Mucous membranes are moist.   Neck: No stridor. Hematological/Lymphatic/Immunilogical: No cervical lymphadenopathy. Cardiovascular: Normal rate, regular rhythm. Normal and symmetric distal pulses are present in all extremities. No murmurs, rubs, or gallops. Respiratory: Normal respiratory effort without tachypnea nor retractions. Breath sounds are clear and equal bilaterally. No wheezes/rales/rhonchi. Gastrointestinal: Soft and nontender. No distention. There is  no CVA tenderness. Genitourinary: deferred Musculoskeletal: Nontender with normal range of motion in all extremities. No joint effusions.  No lower extremity tenderness nor edema. Neurologic:  Normal speech and language. No gross focal neurologic deficits are appreciated. Speech is normal.  Skin:  Skin is warm, dry and intact. No rash noted. Psychiatric: Mood and affect are normal. Speech and behavior are normal. Patient exhibits appropriate insight and judgment.    RADIOLOGY     CT Head Wo Contrast (Final result) Result  time: 06/09/15 22:41:08   Final result by Rad Results In Interface (06/09/15 22:41:08)   Narrative:   CLINICAL DATA: 35 year old female with headache  EXAM: CT HEAD WITHOUT CONTRAST  TECHNIQUE: Contiguous axial images were obtained from the base of the skull through the vertex without intravenous contrast.  COMPARISON: Head CT dated 07/29/2012  FINDINGS: The ventricles and the sulci are appropriate in size for the patient's age. There is no intracranial hemorrhage. No midline shift or mass effect identified. The gray-white matter differentiation is preserved.  The visualized paranasal sinuses and mastoid air cells are well aerated. The calvarium is intact.  IMPRESSION: No acute intracranial pathology.   Electronically Signed By: Elgie Collard M.D. On: 06/09/2015 22:41        ___   INITIAL IMPRESSION / ASSESSMENT AND PLAN / ED COURSE  Pertinent labs & imaging results that were available during my care of the patient were reviewed by me and considered in my medical decision making (see chart for details).    ____________________________________________   FINAL CLINICAL IMPRESSION(S) / ED DIAGNOSES  Final diagnoses:  Acute nonintractable headache, unspecified headache type      Darci Current, MD 06/10/15 [phone]

## 2015-06-10 NOTE — Discharge Instructions (Signed)

## 2015-08-22 ENCOUNTER — Ambulatory Visit (INDEPENDENT_AMBULATORY_CARE_PROVIDER_SITE_OTHER): Payer: Self-pay | Admitting: Family Medicine

## 2015-08-22 ENCOUNTER — Encounter: Payer: Self-pay | Admitting: Family Medicine

## 2015-08-22 DIAGNOSIS — R600 Localized edema: Secondary | ICD-10-CM

## 2015-08-22 DIAGNOSIS — E559 Vitamin D deficiency, unspecified: Secondary | ICD-10-CM

## 2015-08-22 DIAGNOSIS — E039 Hypothyroidism, unspecified: Secondary | ICD-10-CM | POA: Insufficient documentation

## 2015-08-22 DIAGNOSIS — Z7689 Persons encountering health services in other specified circumstances: Secondary | ICD-10-CM

## 2015-08-22 DIAGNOSIS — E034 Atrophy of thyroid (acquired): Secondary | ICD-10-CM | POA: Diagnosis not present

## 2015-08-22 DIAGNOSIS — E079 Disorder of thyroid, unspecified: Secondary | ICD-10-CM

## 2015-08-22 DIAGNOSIS — E038 Other specified hypothyroidism: Secondary | ICD-10-CM

## 2015-08-22 DIAGNOSIS — Z7189 Other specified counseling: Secondary | ICD-10-CM | POA: Diagnosis not present

## 2015-08-22 NOTE — Progress Notes (Signed)
Name: Kari Gallagher   MRN: [id number]    DOB: 08/30/1980   Date:08/22/2015       Progress Note  Subjective  Chief Complaint  Chief Complaint  Patient presents with  . Establish Care  . Hypothyroidism    HPI Here to establish care.  Hx of hyperthyroidism (? Cause).  Treated with radioactive iodine and then became hypothyroid.   Had been on Levothyroxine 100 mcg/d.  She has been off all thyroid meds about 7 months.  She feels tired and run down.  She has been Vit D deficient, but only taking Multivit daily.Marland Kitchen. No problem-specific Assessment & Plan notes found for this encounter.   Past Medical History:  Diagnosis Date  . Depression   . Frequent headaches   . Hypothyroidism     Past Surgical History:  Procedure Laterality Date  . CHOLECYSTECTOMY    . TUBAL LIGATION  2006    Family History  Problem Relation Age of Onset  . Stroke Mother   . Depression Mother   . Mental illness Mother   . Heart disease Father   . Thyroid disease Father   . Lung cancer Father   . Thyroid disease Brother     Social History   Social History  . Marital status: Single    Spouse name: N/A  . Number of children: N/A  . Years of education: N/A   Occupational History  . Not on file.   Social History Main Topics  . Smoking status: Former Smoker    Packs/day: 1.00    Types: Cigarettes  . Smokeless tobacco: Never Used  . Alcohol use No  . Drug use: Unknown  . Sexual activity: Not on file   Other Topics Concern  . Not on file   Social History Narrative  . No narrative on file    No current outpatient prescriptions on file.  Not on File   Review of Systems  Constitutional: Positive for malaise/fatigue. Negative for chills, fever and weight loss.  HENT: Negative for hearing loss.   Eyes: Negative for blurred vision and double vision.  Respiratory: Negative for cough, shortness of breath and wheezing.   Cardiovascular: Positive for palpitations (mild, with anxiety.) and leg  swelling (with standing.). Negative for chest pain.  Gastrointestinal: Negative for abdominal pain, blood in stool and heartburn.  Genitourinary: Negative for dysuria, frequency and urgency.  Musculoskeletal: Negative for joint pain and myalgias.  Skin: Negative for rash.  Neurological: Negative for dizziness, tremors, weakness and headaches.  Psychiatric/Behavioral: Positive for depression. The patient is nervous/anxious.       Objective  Vitals:   08/22/15 1412  BP: 114/78  Pulse: 60  Resp: 16  Temp: 98.6 F (37 C)  TempSrc: Oral  Weight: 170 lb (77.1 kg)  Height: 5\' 6"  (1.676 m)    Physical Exam  Constitutional: She is oriented to person, place, and time and well-developed, well-nourished, and in no distress. No distress.  HENT:  Head: Normocephalic and atraumatic.  Mouth/Throat: No oropharyngeal exudate.  Eyes: EOM are normal. Pupils are equal, round, and reactive to light. No scleral icterus.  Neck: Normal range of motion. Neck supple. Carotid bruit is not present. No thyromegaly present.  Cardiovascular: Normal rate, regular rhythm and normal heart sounds.  Exam reveals no gallop and no friction rub.   No murmur heard. Pulmonary/Chest: Effort normal and breath sounds normal. No respiratory distress. She has no wheezes. She has no rales.  Abdominal: Soft. Bowel sounds are normal. She  exhibits no distension and no mass. There is no tenderness.  Musculoskeletal: She exhibits no edema.  Lymphadenopathy:    She has no cervical adenopathy.  Neurological: She is alert and oriented to person, place, and time.  Psychiatric: Mood, memory, affect and judgment normal.  Vitals reviewed.      No results found for this or any previous visit (from the past 2160 hour(s)).   Assessment & Plan  Problem List Items Addressed This Visit      Endocrine   Thyroid disease   Hypothyroidism   Relevant Orders   COMPLETE METABOLIC PANEL WITH GFR   CBC with Differential   TSH      Other   Encounter to establish care   Vitamin D deficiency   Relevant Orders   Vitamin D (25 hydroxy)   Pedal edema    Other Visit Diagnoses   None.     No orders of the defined types were placed in this encounter.  1. Thyroid disease   2. Encounter to establish care   3. Hypothyroidism due to acquired atrophy of thyroid  - COMPLETE METABOLIC PANEL WITH GFR - CBC with Differential - TSH  4. Vitamin D deficiency  - Vitamin D (25 hydroxy)  5. Pedal edema

## 2015-08-23 ENCOUNTER — Other Ambulatory Visit: Payer: Medicaid Other

## 2015-08-23 LAB — CBC WITH DIFFERENTIAL/PLATELET
Basophils Absolute: 0 cells/uL (ref 0–200)
Basophils Relative: 0 %
EOS PCT: 2 %
Eosinophils Absolute: 110 cells/uL (ref 15–500)
HCT: 35.8 % (ref 35.0–45.0)
Hemoglobin: 11.7 g/dL (ref 11.7–15.5)
LYMPHS PCT: 30 %
Lymphs Abs: 1650 cells/uL (ref 850–3900)
MCH: 28.3 pg (ref 27.0–33.0)
MCHC: 32.7 g/dL (ref 32.0–36.0)
MCV: 86.7 fL (ref 80.0–100.0)
MPV: 10 fL (ref 7.5–12.5)
Monocytes Absolute: 385 cells/uL (ref 200–950)
Monocytes Relative: 7 %
Neutro Abs: 3355 cells/uL (ref 1500–7800)
Neutrophils Relative %: 61 %
Platelets: 241 10*3/uL (ref 140–400)
RBC: 4.13 MIL/uL (ref 3.80–5.10)
RDW: 13.9 % (ref 11.0–15.0)
WBC: 5.5 10*3/uL (ref 3.8–10.8)

## 2015-08-23 LAB — COMPLETE METABOLIC PANEL WITH GFR
ALK PHOS: 69 U/L (ref 33–115)
ALT: 7 U/L (ref 6–29)
AST: 11 U/L (ref 10–30)
Albumin: 4.1 g/dL (ref 3.6–5.1)
BUN: 8 mg/dL (ref 7–25)
CALCIUM: 8.9 mg/dL (ref 8.6–10.2)
CO2: 24 mmol/L (ref 20–31)
CREATININE: 1 mg/dL (ref 0.50–1.10)
Chloride: 107 mmol/L (ref 98–110)
GFR, Est African American: 84 mL/min (ref >=60)
GFR, Est Non African American: 73 mL/min (ref >=60)
Glucose, Bld: 103 mg/dL — ABNORMAL HIGH (ref 65–99)
Potassium: 4 mmol/L (ref 3.5–5.3)
Sodium: 140 mmol/L (ref 135–146)
Total Bilirubin: 0.3 mg/dL (ref 0.2–1.2)
Total Protein: 6.9 g/dL (ref 6.1–8.1)

## 2015-08-24 LAB — VITAMIN D 25 HYDROXY (VIT D DEFICIENCY, FRACTURES): VIT D 25 HYDROXY: 20 ng/mL — AB (ref 30–100)

## 2015-08-24 LAB — TSH: TSH: 29.64 m[IU]/L — AB

## 2015-08-25 ENCOUNTER — Other Ambulatory Visit: Payer: Self-pay | Admitting: Family Medicine

## 2015-08-25 MED ORDER — LEVOTHYROXINE SODIUM 50 MCG PO TABS: 50.0000 ug | ORAL_TABLET | Freq: Every day | ORAL | 6 refills | Status: DC

## 2015-08-25 MED ORDER — LEVOTHYROXINE SODIUM 25 MCG PO CAPS: ORAL_CAPSULE | ORAL | 0 refills | Status: DC

## 2015-10-03 ENCOUNTER — Encounter: Payer: Self-pay | Admitting: Family Medicine

## 2015-10-03 ENCOUNTER — Ambulatory Visit (INDEPENDENT_AMBULATORY_CARE_PROVIDER_SITE_OTHER): Payer: Medicaid Other | Admitting: Family Medicine

## 2015-10-03 VITALS — BP 120/76 | HR 60 | Temp 98.6°F | Resp 16 | Ht 66.0 in | Wt 171.5 lb

## 2015-10-03 DIAGNOSIS — E559 Vitamin D deficiency, unspecified: Secondary | ICD-10-CM | POA: Diagnosis not present

## 2015-10-03 DIAGNOSIS — F325 Major depressive disorder, single episode, in full remission: Secondary | ICD-10-CM | POA: Insufficient documentation

## 2015-10-03 DIAGNOSIS — F331 Major depressive disorder, recurrent, moderate: Secondary | ICD-10-CM | POA: Diagnosis not present

## 2015-10-03 DIAGNOSIS — F419 Anxiety disorder, unspecified: Secondary | ICD-10-CM

## 2015-10-03 DIAGNOSIS — E034 Atrophy of thyroid (acquired): Secondary | ICD-10-CM

## 2015-10-03 MED ORDER — LEVOTHYROXINE SODIUM 100 MCG PO TABS: 100.0000 ug | ORAL_TABLET | Freq: Every day | ORAL | 1 refills | Status: DC

## 2015-10-03 MED ORDER — VITAMIN D3 125 MCG (5000 UT) PO CAPS: 5000.0000 [IU] | ORAL_CAPSULE | Freq: Every day | ORAL | 0 refills | Status: DC

## 2015-10-03 MED ORDER — SERTRALINE HCL 50 MG PO TABS: 50.0000 mg | ORAL_TABLET | Freq: Every day | ORAL | 2 refills | Status: DC

## 2015-10-03 MED ORDER — LEVOTHYROXINE SODIUM 75 MCG PO TABS: 75.0000 ug | ORAL_TABLET | Freq: Every day | ORAL | 1 refills | Status: DC

## 2015-10-03 NOTE — Patient Instructions (Signed)
Thank you for coming in to clinic today.  1. For Thyroid - Stop taking 50mcg daily - Start taking 75mcg daily for 4 weeks, if still sluggish and tired, then increase to new pill rx 100mcg daily for next 4 weeks until you return  2. For Vitamin D deficiency - Stop taking over the counter 1 to 2000 daily - Start taking rx 5,000 daily for 8 weeks, then resume the OTC 2,000 unit capsule daily  3. For Depression - Start taking Sertraline 50mg  daily, you may get some upset stomach and diarrhea / nausea for first few days to weeks, this improves with time. Take with food to reduce this. - it may take up to 4 to 6 weeks for full effect, if not working well we will change - if thoughts of harming yourself or others, please call 911 or seek immediate help - Recommend contacting a therapist as you planned, to get established, in future if you need a referral let us know.  Psych Counseling / Therapy Recoomendations  Self Referral [facility]) [place] [address], [place], [place] [address] Phone: [phone]   Self Referral:  1. [facility]   Address: [address], [place], [place] [address] Hours: Open today  9AM-7PM Phone: [phone]  2. Anell Barr[facility]yl [facility] [employer] [address], [facility]  [facility] Address: [address], [place], [place] [address] Phone: [phone]   Please schedule a follow-up appointment with Dr. Althea CharonKaramalegos or Dr Juanetta GoslingHawkins in 8 weeks for Thyroid / Vit D and Depression, blood work  If you have any other questions or concerns, please feel free to call the clinic or send a message through MyChart. You may also schedule an earlier appointment if necessary.  Saralyn PilarAlexander Karamalegos, DO [facility]

## 2015-10-03 NOTE — Assessment & Plan Note (Signed)
Suspected MDD not previously formerly diagnosed. No prior treatment or therapy. Suspect may be related to hypothyroidism uncontrolled and low Vit D as well for fatigue / tired. Likely multifactorial with life stressors and fam history of depression, suspect significant underlying depression despite not controlled thyroid. PHQ9 17 unchanged over 1.5 months  Plan: 1. Start Sertraline 50mg  daily for 4-8 weeks for now, future titration as tolerated, slow increase given optimizing thyroid first, reviewed precautions for this medication with potential for Gi side effects and low risk of inc suicidal behavior, advised to seek help if concerns 2. Given recommendations of therapist for self referral (she may go to her daughter's therapist) 3. Follow-up 8 weeks to adjust med, consider future alternatives if needed

## 2015-10-03 NOTE — Progress Notes (Signed)
Subjective:    Patient ID: Kari Gallagher, female    DOB: 11-19-1980, 36 y.o.   MRN: [id number]  Kari Gallagher is a 35 y.o. female presenting on 10/03/2015 for Hypothyroidism (follow up)   HPI   HYPOTYROIDISM, FOLLOW-UP: - Reports follow-up from last visit 08/22/15 had labs, with elevated TSH 29.64, describes chronic history thyroid problems, about 2-3 years ago dx with Hyperthyroidism treated with radioactive iodine and then has developed hyopthyroidism, she was previously better controlled up to levothyroxine 100 to 133mg daily, did feel much better when this was controlled, and admits now feels like it is causing her symptoms of still having same tired and sluggish feelings since last visit. Currently taking Levothyroxine 548m daily (she had been off of levothyroxine for >6 months prior to that visit) - Admits feeling fatigue, sluggish, decreased energy  VITAMIN D DEFICIENCY: - Prior history of low Vitamin D, was taking MVI. Last checked 08/2015 had Vit D lvl 20. Taking OTC 1,000 to 2,000 iu daily. - Admits fatigue, as above  DEPRESSION: - Reports chronic problem for several years, with several life stressors worsening, including family member passing recently. Describes multiple symptoms seem related to fatigue, decreased energy and concentration, poor sleep (did admit to history of 3rd shift job several months ago, now not working). - No prior treatment for depression or therapy / counseling. - Admits family history of Depression with mother and other family members - Currently unemployed but going back to school for degree in medical billing & coding - Interested in therapy and medication - Admits some brief crying spells - Denies any suicidal or homicidal ideation at this time.  Social History  Substance Use Topics  . Smoking status: Former Smoker    Packs/day: 1.00    Types: Cigarettes  . Smokeless tobacco: Never Used  . Alcohol use No    Review of Systems    Constitutional: Positive for appetite change (reduced appetite with depression) and fatigue. Negative for activity change, chills, diaphoresis, fever and unexpected weight change.  HENT: Negative for congestion and sinus pressure.   Eyes: Negative for visual disturbance.  Respiratory: Negative for chest tightness and shortness of breath.   Cardiovascular: Positive for leg swelling (not actively today). Negative for chest pain and palpitations.  Gastrointestinal: Negative for abdominal pain, nausea and vomiting.  Endocrine: Positive for cold intolerance. Negative for heat intolerance.  Musculoskeletal: Negative for arthralgias and back pain.  Allergic/Immunologic: Negative for environmental allergies.  Neurological: Positive for headaches (chronic tension type, none actively today). Negative for dizziness, syncope, weakness, light-headedness and numbness.  Psychiatric/Behavioral: Positive for decreased concentration, dysphoric mood and sleep disturbance. Negative for agitation, behavioral problems, confusion, hallucinations, self-injury and suicidal ideas. The patient is not nervous/anxious and is not hyperactive.    Per HPI unless specifically indicated above  [facility] 10/03/2015 08/22/2015  Decreased Interest 2 2  Down, Depressed, Hopeless 3 2  PHQ - 2 Score 5 4  Altered sleeping 3 3  Tired, decreased energy 3 3  Change in appetite 2 2  Feeling bad or failure about yourself  1 2  Trouble concentrating 2 2  Moving slowly or fidgety/restless 1 1  Suicidal thoughts 0 0  PHQ-9 Score 17 17  Difficult doing work/chores Very difficult Somewhat difficult        Objective:    BP 120/76   Pulse 60   Temp 98.6 F (37 C) (Oral)   Resp 16   Ht '5\' 6"'  (1.676 m)  Wt 171 lb 8 oz (77.8 kg)   LMP 09/27/2015   BMI 27.68 kg/m   Wt Readings from Last 3 Encounters:  10/03/15 171 lb 8 oz (77.8 kg)  08/22/15 170 lb (77.1 kg)  06/09/15 170 lb (77.1 kg)    Physical Exam   Constitutional: She is oriented to person, place, and time. She appears well-developed and well-nourished. No distress.  Well-appearing, comfortable, cooperative   HENT:  Head: Normocephalic and atraumatic.  Neck: Normal range of motion. Neck supple. No thyromegaly present.  Cardiovascular: Normal rate, regular rhythm, normal heart sounds and intact distal pulses.   No murmur heard. Pulmonary/Chest: Effort normal and breath sounds normal. No respiratory distress. She has no wheezes. She has no rales.  Musculoskeletal: Normal range of motion. She exhibits no edema.  Lymphadenopathy:    She has no cervical adenopathy.  Neurological: She is alert and oriented to person, place, and time.  Skin: Skin is warm and dry. No rash noted. She is not diaphoretic.  Psychiatric: Her behavior is normal. Thought content normal.  Well groomed. Mostly congruent affect, she has a positive affect and good eye contact, normal speech, but still depressive thoughts.  Nursing note and vitals reviewed.  I have personally reviewed the below lab results from 08/22/15  Results for orders placed or performed in visit on 08/22/15  COMPLETE METABOLIC PANEL WITH GFR  Result Value Ref Range   Sodium 140 135 - 146 mmol/L   Potassium 4.0 3.5 - 5.3 mmol/L   Chloride 107 98 - 110 mmol/L   CO2 24 20 - 31 mmol/L   Glucose, Bld 103 (H) 65 - 99 mg/dL   BUN 8 7 - 25 mg/dL   Creat 1.00 0.50 - 1.10 mg/dL   Total Bilirubin 0.3 0.2 - 1.2 mg/dL   Alkaline Phosphatase 69 33 - 115 U/L   AST 11 10 - 30 U/L   ALT 7 6 - 29 U/L   Total Protein 6.9 6.1 - 8.1 g/dL   Albumin 4.1 3.6 - 5.1 g/dL   Calcium 8.9 8.6 - 10.2 mg/dL   GFR, Est African American 84 >=60 mL/min   GFR, Est Non African American 73 >=60 mL/min  CBC with Differential  Result Value Ref Range   WBC 5.5 3.8 - 10.8 K/uL   RBC 4.13 3.80 - 5.10 MIL/uL   Hemoglobin 11.7 11.7 - 15.5 g/dL   HCT 35.8 35.0 - 45.0 %   MCV 86.7 80.0 - 100.0 fL   MCH 28.3 27.0 - 33.0 pg    MCHC 32.7 32.0 - 36.0 g/dL   RDW 13.9 11.0 - 15.0 %   Platelets 241 140 - 400 K/uL   MPV 10.0 7.5 - 12.5 fL   Neutro Abs 3,355 1,500 - 7,800 cells/uL   Lymphs Abs 1,650 850 - 3,900 cells/uL   Monocytes Absolute 385 200 - 950 cells/uL   Eosinophils Absolute 110 15 - 500 cells/uL   Basophils Absolute 0 0 - 200 cells/uL   Neutrophils Relative % 61 %   Lymphocytes Relative 30 %   Monocytes Relative 7 %   Eosinophils Relative 2 %   Basophils Relative 0 %   Smear Review Criteria for review not met   TSH  Result Value Ref Range   TSH 29.64 (H) mIU/L  Vitamin D (25 hydroxy)  Result Value Ref Range   Vit D, 25-Hydroxy 20 (L) 30 - 100 ng/mL      Assessment & Plan:   Problem List Items  Addressed This Visit    Vitamin D deficiency    Deficient at last check 20, 08/2015 Start vitamin D3 5,000 iu replacement therapy daily x 8 weeks then switch to OTC vitamin D3 2,000 daily Check Vitamin D lvl at next visit 8 weeks      Relevant Medications   Cholecalciferol (VITAMIN D3) 5000 units CAPS   Hypothyroidism - Primary    Still symptomatic, suspect not optimally controlled on current dose 48mg daily. Secondary to radioactive iodine therapy for hyperthyroidism (2-3 yr ago), prior stable on Levothyroxine 100-1254m, had been off for several months, last TSH 29.64 (08/2015)  Plan: 1. Increase Levothyroxine to new rx 7571mdaily (given 30 day supply), likely need to inc to 100m44maily (given separate new rx to fill for following 4 weeks if still symptomatic) 2. Will not check TSH today given only 2 weeks on 50mc36mily and still symptomatic 3. Also treat underlying Depression and Vit D 4. Follow-up 8 weeks for check TSH, Vit D adjust levothyroxine      Relevant Medications   levothyroxine (SYNTHROID, LEVOTHROID) 75 MCG tablet   levothyroxine (SYNTHROID, LEVOTHROID) 100 MCG tablet   Depression    Suspected MDD not previously formerly diagnosed. No prior treatment or therapy. Suspect may be  related to hypothyroidism uncontrolled and low Vit D as well for fatigue / tired. Likely multifactorial with life stressors and fam history of depression, suspect significant underlying depression despite not controlled thyroid. PHQ9 17 unchanged over 1.5 months  Plan: 1. Start Sertraline 50mg 67my for 4-8 weeks for now, future titration as tolerated, slow increase given optimizing thyroid first, reviewed precautions for this medication with potential for Gi side effects and low risk of inc suicidal behavior, advised to seek help if concerns 2. Given recommendations of therapist for self referral (she may go to her daughter's therapist) 3. Follow-up 8 weeks to adjust med, consider future alternatives if needed      Relevant Medications   sertraline (ZOLOFT) 50 MG tablet    Other Visit Diagnoses   None.     Meds ordered this encounter  Medications  . levothyroxine (SYNTHROID, LEVOTHROID) 75 MCG tablet    Sig: Take 1 tablet (75 mcg total) by mouth daily. Take for 4 weeks, if still symptomatic, then switch to 100mcg 54my.    Dispense:  30 tablet    Refill:  1  . levothyroxine (SYNTHROID, LEVOTHROID) 100 MCG tablet    Sig: Take 1 tablet (100 mcg total) by mouth daily. Only start after finished 4 weeks of 75mcg d53m if still having symptoms.    Dispense:  30 tablet    Refill:  1  . Cholecalciferol (VITAMIN D3) 5000 units CAPS    Sig: Take 1 capsule (5,000 Units total) by mouth daily. For 8 weeks, then start taking OTC one 2,000 unit capsule daily.    Dispense:  60 capsule    Refill:  0  . sertraline (ZOLOFT) 50 MG tablet    Sig: Take 1 tablet (50 mg total) by mouth daily.    Dispense:  30 tablet    Refill:  2      Follow up plan: Return in about 8 weeks (around 11/28/2015) for thyroid, depression, vit d.  AlexandeNobie Putnamth [facility] 10/03/2015, 1:42 PM

## 2015-10-03 NOTE — Assessment & Plan Note (Signed)
Still symptomatic, suspect not optimally controlled on current dose 50mcg daily. Secondary to radioactive iodine therapy for hyperthyroidism (2-3 yr ago), prior stable on Levothyroxine 100-110625mcg, had been off for several months, last TSH 29.64 (08/2015)  Plan: 1. Increase Levothyroxine to new rx 75mcg daily (given 30 day supply), likely need to inc to 100mcg daily (given separate new rx to fill for following 4 weeks if still symptomatic) 2. Will not check TSH today given only 2 weeks on 50mcg daily and still symptomatic 3. Also treat underlying Depression and Vit D 4. Follow-up 8 weeks for check TSH, Vit D adjust levothyroxine

## 2015-10-03 NOTE — Assessment & Plan Note (Signed)
Deficient at last check 20, 08/2015 Start vitamin D3 5,000 iu replacement therapy daily x 8 weeks then switch to OTC vitamin D3 2,000 daily Check Vitamin D lvl at next visit 8 weeks

## 2015-11-28 ENCOUNTER — Ambulatory Visit: Payer: Medicaid Other | Admitting: Family Medicine

## 2015-12-07 ENCOUNTER — Ambulatory Visit (INDEPENDENT_AMBULATORY_CARE_PROVIDER_SITE_OTHER): Payer: Medicaid Other | Admitting: Family Medicine

## 2015-12-07 ENCOUNTER — Encounter: Payer: Self-pay | Admitting: Family Medicine

## 2015-12-07 VITALS — BP 117/65 | HR 69 | Temp 98.5°F | Resp 16 | Ht 66.0 in | Wt 181.0 lb

## 2015-12-07 DIAGNOSIS — Z23 Encounter for immunization: Secondary | ICD-10-CM

## 2015-12-07 DIAGNOSIS — E034 Atrophy of thyroid (acquired): Secondary | ICD-10-CM | POA: Diagnosis not present

## 2015-12-07 DIAGNOSIS — E559 Vitamin D deficiency, unspecified: Secondary | ICD-10-CM | POA: Diagnosis not present

## 2015-12-07 DIAGNOSIS — F331 Major depressive disorder, recurrent, moderate: Secondary | ICD-10-CM

## 2015-12-07 DIAGNOSIS — G4700 Insomnia, unspecified: Secondary | ICD-10-CM | POA: Insufficient documentation

## 2015-12-07 DIAGNOSIS — Z114 Encounter for screening for human immunodeficiency virus [HIV]: Secondary | ICD-10-CM | POA: Diagnosis not present

## 2015-12-07 DIAGNOSIS — G4726 Circadian rhythm sleep disorder, shift work type: Secondary | ICD-10-CM | POA: Diagnosis not present

## 2015-12-07 DIAGNOSIS — G4701 Insomnia due to medical condition: Secondary | ICD-10-CM | POA: Diagnosis not present

## 2015-12-07 LAB — TSH: TSH: 13.57 mIU/L — ABNORMAL HIGH

## 2015-12-07 MED ORDER — TRAZODONE HCL 50 MG PO TABS: 50.0000 mg | ORAL_TABLET | Freq: Every day | ORAL | 2 refills | Status: DC

## 2015-12-07 MED ORDER — SERTRALINE HCL 50 MG PO TABS: ORAL_TABLET | ORAL | 2 refills | Status: DC

## 2015-12-07 MED ORDER — VITAMIN D3 50 MCG (2000 UT) PO CAPS: 2000.0000 [IU] | ORAL_CAPSULE | Freq: Every day | ORAL | Status: DC

## 2015-12-07 NOTE — Assessment & Plan Note (Signed)
Suspect secondary to both depression and shift work sleep disorder, seems to have chronic variable work schedules causing problem. Now very poor sleep only < 3 hours nightly.  Plan: 1. Start Trazodone 50mg  bedtime, titrate up as needed in future 2. Gradual increase Zoloft 3. Sleep hygiene handout 4. Emphasized need to try to lay down for bed atleast at 0100, goal to get 5-6 hours of sleep, since seems unable to take nap after gets kids ready for school. If trazodone not helping as much, she may also try OTC melatonin and sleep aid at 0100 to help fall asleep 5. Follow-up 4 weeks

## 2015-12-07 NOTE — Assessment & Plan Note (Signed)
Suspect primary etiology with poor sleep now, in setting of insomnia with depression. - Currently on new 2nd shift job, but poor sleep hygiene unable to fall asleep for several hours after gets home - Handout given sleep hygiene - Trazodone, likely need higher dose - Inc sertraline - Follow-up

## 2015-12-07 NOTE — Progress Notes (Signed)
Subjective:    Patient ID: Kari Gallagher, female    DOB: 1980-03-25, 35 y.o.   MRN: [id number]  Kari Gallagher is a 35 y.o. female presenting on 12/07/2015 for Hypothyroidism   HPI   HYPOTYROIDISM, FOLLOW-UP: - Last visit 10/03/15 for hypothyroidism, with last TSH lab 29.64 (08/22/15), she was increased to Levothyroxine 32mg daily (from 523m, and then after 4 weeks if tolerating given rx for 10054mdaily - Today she is taking levothyroxine 100m68maily, overall doing well but still feels like thyroid is not well controlled, still tired, admits some abnormal hair growth on chin, see below discussion on depression - Admits feeling fatigue, sluggish, decreased energy - Here to check TSH today   DEPRESSION / INSOMNIA / SHIFT WORK SLEEP DISORDER: - Last visit 10/03/15, discussed in detail, see note for background details, she was started on Sertraline 50mg57mly, and had no prior depression treatment - Today overall reports only mild-moderate improvement in depressive symptoms, and PHQ is improved, see below. 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 Tries to lay back down 0900 or 1000, but hard time falling back asleep and sets alarm for 12:45pm to get ready for work. Prior history of working 3rd shift job months ago, tried to get 1st shift but unavailable - No alcohol, occasional coffee - No longer going back school for degree in medical billing & coding, will hold for future - Resolved crying spells - Denies any suicidal or homicidal ideation at this time.  VITAMIN D DEFICIENCY: - Last checked Vitamin D 08/2015 with result 20,  deficient. Last visit 1 month ago started on Vit D3 5,000iu daily for 8 weeks, then to take 2,000iu OTC daily, still taking this now and tolerating well - Taking MVI - Admits fatigue and tired still, see above  Health Maintenance: - Due for Flu shot today, will get - Due for routine HIV screen today - Due for TDap, declines today due to concern with sore arm  Social History  Substance Use Topics  . Smoking status: Former Smoker    Packs/day: 1.00    Types: Cigarettes  . Smokeless tobacco: Never Used  . Alcohol use No    Review of Systems   Per HPI unless specifically indicated above  [facility] 10/03/2015 08/22/2015  Decreased Interest '1 2 2  ' Down, Depressed, Hopeless '1 3 2  ' PHQ - 2 Score '2 5 4  ' Altered sleeping '3 3 3  ' Tired, decreased energy '3 3 3  ' Change in appetite '1 2 2  ' Feeling bad or failure about yourself  0 1 2  Trouble concentrating '3 2 2  ' Moving slowly or fidgety/restless '2 1 1  ' Suicidal thoughts 0 0 0  PHQ-9 Score '14 17 17  ' Difficult doing work/chores Somewhat difficult Very difficult Somewhat difficult        Objective:    BP 117/65 (BP Location: Right Arm, Patient Position: Sitting, Cuff Size: Normal)   Pulse 69   Temp 98.5 F (36.9 C) (Oral)   Resp 16   Ht '5\' 6"'  (1.676 m)   Wt  181 lb (82.1 kg)   BMI 29.21 kg/m   Wt Readings from Last 3 Encounters:  12/07/15 181 lb (82.1 kg)  10/03/15 171 lb 8 oz (77.8 kg)  08/22/15 170 lb (77.1 kg)    Physical Exam  Constitutional: She is oriented to person, place, and time. She appears well-developed and well-nourished. No distress.  Tired-appearing but well, comfortable, cooperative   HENT:  Head: Normocephalic and atraumatic.  Mouth/Throat: Oropharynx is clear and moist.  Eyes: Conjunctivae are normal. Right eye exhibits no discharge. Left eye exhibits no discharge.  Neck: Normal range of motion. Neck supple. No thyromegaly present.  Cardiovascular: Normal rate and intact distal  pulses.   Pulmonary/Chest: Effort normal.  Musculoskeletal: Normal range of motion. She exhibits no edema.  Lymphadenopathy:    She has no cervical adenopathy.  Neurological: She is alert and oriented to person, place, and time. Coordination normal.  Skin: Skin is warm and dry. She is not diaphoretic.  Psychiatric: Her behavior is normal.  Well groomed. Improved normal positive mood and affect today without sadness. Not appearing anxious. No crying spells or tearful. Normal speech and thoughts. Does seem to be excessively tired today.  Nursing note and vitals reviewed.  I have personally reviewed the below lab results from 08/22/15  Results for orders placed or performed in visit on 08/22/15  COMPLETE METABOLIC PANEL WITH GFR  Result Value Ref Range   Sodium 140 135 - 146 mmol/L   Potassium 4.0 3.5 - 5.3 mmol/L   Chloride 107 98 - 110 mmol/L   CO2 24 20 - 31 mmol/L   Glucose, Bld 103 (H) 65 - 99 mg/dL   BUN 8 7 - 25 mg/dL   Creat 1.00 0.50 - 1.10 mg/dL   Total Bilirubin 0.3 0.2 - 1.2 mg/dL   Alkaline Phosphatase 69 33 - 115 U/L   AST 11 10 - 30 U/L   ALT 7 6 - 29 U/L   Total Protein 6.9 6.1 - 8.1 g/dL   Albumin 4.1 3.6 - 5.1 g/dL   Calcium 8.9 8.6 - 10.2 mg/dL   GFR, Est African American 84 >=60 mL/min   GFR, Est Non African American 73 >=60 mL/min  CBC with Differential  Result Value Ref Range   WBC 5.5 3.8 - 10.8 K/uL   RBC 4.13 3.80 - 5.10 MIL/uL   Hemoglobin 11.7 11.7 - 15.5 g/dL   HCT 35.8 35.0 - 45.0 %   MCV 86.7 80.0 - 100.0 fL   MCH 28.3 27.0 - 33.0 pg   MCHC 32.7 32.0 - 36.0 g/dL   RDW 13.9 11.0 - 15.0 %   Platelets 241 140 - 400 K/uL   MPV 10.0 7.5 - 12.5 fL   Neutro Abs 3,355 1,500 - 7,800 cells/uL   Lymphs Abs 1,650 850 - 3,900 cells/uL   Monocytes Absolute 385 200 - 950 cells/uL   Eosinophils Absolute 110 15 - 500 cells/uL   Basophils Absolute 0 0 - 200 cells/uL   Neutrophils Relative % 61 %   Lymphocytes Relative 30 %   Monocytes Relative 7 %    Eosinophils Relative 2 %   Basophils Relative 0 %   Smear Review Criteria for review not met   TSH  Result Value Ref Range   TSH 29.64 (H) mIU/L  Vitamin D (25 hydroxy)  Result Value Ref Range   Vit D, 25-Hydroxy 20 (L) 30 - 100 ng/mL      Assessment & Plan:   Problem List Items  Addressed This Visit    Vitamin D deficiency    Check Vitamin D today Continue Vitamin D3 2,000iu OTC, will notify patient if need to increase back to OTC Vit D3 5,000iu daily      Relevant Medications   Cholecalciferol (VITAMIN D3) 2000 units capsule   Other Relevant Orders   VITAMIN D 25 Hydroxy (Vit-D Deficiency, Fractures)   Shift work sleep disorder    Suspect primary etiology with poor sleep now, in setting of insomnia with depression. - Currently on new 2nd shift job, but poor sleep hygiene unable to fall asleep for several hours after gets home - Handout given sleep hygiene - Trazodone, likely need higher dose - Inc sertraline - Follow-up      Insomnia    Suspect secondary to both depression and shift work sleep disorder, seems to have chronic variable work schedules causing problem. Now very poor sleep only < 3 hours nightly.  Plan: 1. Start Trazodone 7m bedtime, titrate up as needed in future 2. Gradual increase Zoloft 3. Sleep hygiene handout 4. Emphasized need to try to lay down for bed atleast at 0100, goal to get 5-6 hours of sleep, since seems unable to take nap after gets kids ready for school. If trazodone not helping as much, she may also try OTC melatonin and sleep aid at 0100 to help fall asleep 5. Follow-up 4 weeks      Relevant Medications   traZODone (DESYREL) 50 MG tablet   Hypothyroidism - Primary    Seems slightly improved on levothyroxine 109m daily, but still symptoms, however concern with multifactorial depression and insomnia / lack of sleep with shift work sleep disorder. Also with some abnormal facial hair growth. - Last TSH 29.64 (08/2015)  Plan: 1. Check  TSH today - follow-up result 2. Continue levothyroxine 10054mat this time, will send new rx for 112 to 125m15maily depending on degree of result 3. Treat underlying depression, sleep disorder 4. Follow-up      Relevant Orders   TSH   Depression    Mild-moderate improvement in MDD on Sertraline, depressive symptoms, now still problematic insomnia poor sleep with suspected shift work sleep disorder (much worse now after starting 2nd shift job recently), multifactorial hypothyroid and vit D deficiency PHQ improved  Plan: 1. Start new Trazodone 50mg38mtime - for depression and sleep, likely need to titrate up dose 2. After 2 weeks of trazodone, increase Zoloft to 1.5 tabs (75mg)26mly for 1-2 weeks, then increase to 100mg (33mbs) as tolerated 3. Again advised to follow-up with therapist in future 4. Emphasized importance of improved sleep in order to help her mood and function, see A&P 5. Follow-up 4 weeks, consider future inc trazodone, inc sertraline, vs trial on mirtazapine      Relevant Medications   sertraline (ZOLOFT) 50 MG tablet   traZODone (DESYREL) 50 MG tablet    Other Visit Diagnoses    Screening for HIV (human immunodeficiency virus)       Relevant Orders   HIV antibody   Needs flu shot       Relevant Orders   Flu Vaccine QUAD 36+ mos IM (Completed)      Meds ordered this encounter  Medications  . Cholecalciferol (VITAMIN D3) 2000 units capsule    Sig: Take 1 capsule (2,000 Units total) by mouth daily.  . sertraline (ZOLOFT) 50 MG tablet    Sig: Start taking 1 and half tablet (75mg) d56m for 2 weeks, and if depression not  improved increase to 2 pills (136m) daily.    Dispense:  60 tablet    Refill:  2  . traZODone (DESYREL) 50 MG tablet    Sig: Take 1 tablet (50 mg total) by mouth at bedtime.    Dispense:  30 tablet    Refill:  2      Follow up plan: Return in about 4 weeks (around 01/04/2016) for depression, insomnia.  ANobie Putnam [facility] 12/07/2015, 11:58 AM

## 2015-12-07 NOTE — Assessment & Plan Note (Signed)
Check Vitamin D today Continue Vitamin D3 2,000iu OTC, will notify patient if need to increase back to OTC Vit D3 5,000iu daily

## 2015-12-07 NOTE — Assessment & Plan Note (Signed)
Mild-moderate improvement in MDD on Sertraline, depressive symptoms, now still problematic insomnia poor sleep with suspected shift work sleep disorder (much worse now after starting 2nd shift job recently), multifactorial hypothyroid and vit D deficiency PHQ improved  Plan: 1. Start new Trazodone 50mg  bedtime - for depression and sleep, likely need to titrate up dose 2. After 2 weeks of trazodone, increase Zoloft to 1.5 tabs (75mg ) daily for 1-2 weeks, then increase to 100mg  (2 tabs) as tolerated 3. Again advised to follow-up with therapist in future 4. Emphasized importance of improved sleep in order to help her mood and function, see A&P 5. Follow-up 4 weeks, consider future inc trazodone, inc sertraline, vs trial on mirtazapine

## 2015-12-07 NOTE — Assessment & Plan Note (Signed)
Seems slightly improved on levothyroxine 100mcg daily, but still symptoms, however concern with multifactorial depression and insomnia / lack of sleep with shift work sleep disorder. Also with some abnormal facial hair growth. - Last TSH 29.64 (08/2015)  Plan: 1. Check TSH today - follow-up result 2. Continue levothyroxine 100mcg at this time, will send new rx for 112 to 125mcg daily depending on degree of result 3. Treat underlying depression, sleep disorder 4. Follow-up

## 2015-12-07 NOTE — Patient Instructions (Signed)
Thank you for coming in to clinic today.  1. For Thyroid - Check TSH thyroid lab today, at this time continue Levothyroxine 100mcg daily, if needed I will send in new stronger rx and notify you  2. For Vitamin D deficiency - Continue taking Vitamin D3 supplement 2000 daily, over the counter - Check lab today, will notify you if we need to go back to the 5,000 units daily  3. For Depression - Start taking Trazodone 50mg  at bedtime (around 1230, about 30 min prior to bed) - Continue taking Sertraline 50mg  daily for 1-2 weeks after starting new Trazodone. Then if depression is not much improved, go ahead and increase to ONE AND A HALF tablets of Sertraline (75mg  total dose) daily for next 2 weeks, then if still not much improved, increase to TWO WHOLE tablets Sertraline (100mg  total dose) daily, continue at this dose until next visit - it may take up to 4 to 6 weeks for full effect - if thoughts of harming yourself or others, please call 911 or seek immediate help  INSOMNIA - It is very important to reset your sleep cycle and start getting at least 5-6 hours of sleep at night - New sleep medicine should help - Try to lay down to go to sleep around 1:00am, if after 1-2 weeks still difficulty falling asleep, may try over the counter Melatonin or Tylenol-PM or Unisom, only one of these do not take them all  Sleep Hygiene Recommendations to promote healthy sleep in all patients, especially if symptoms of insomnia are worsening. Due to the nature of sleep rhythms, if your body gets "out of rhythm", it may take some time before your sleep cycle can be "reset".  Please try to follow as many of the following tips as you can, usually there are only a few of these are the primary cause of the problem.  ?To reset your sleep rhythm, go to bed and get up at the same time every day ?Sleep only long enough to feel rested and then get out of bed ?Do not try to force yourself to sleep. If you can't sleep, get  out of bed and try again later.  ?Have coffee, tea, and other foods that have caffeine only in the morning ?Exercise several days a week, but not right before bed ?If you drink alcohol, avoid alcohol in the late afternoon, evening, and bedtime ?If you smoke, avoid smoking, especially in the evening  ?Avoid watching TV or looking at phones, computers, or reading devices ("e-books") that give off light at least 30 minutes before bed. This artificial light sends "awake signals" to your brain and can make it harder to fall asleep. ?Keep your bedroom dark, cool, quiet, and free of reminders of other things that cause you stress ?Try your best to solve or at least address your problems before you go to bed   - Recommend contacting a therapist as you planned, to get established, in future if you need a referral let us know.  Psych Counseling / Therapy Recoomendations  Self Referral [facility] [place] [address], [place], [place] [address] Phone: [phone]  Self Referral:  1. [facility] [place] [facility]   Address: [address], [place], [place] [address] Hours: Open today  9AM-7PM Phone: [phone]  2. Anell Barrheryl Harper [employer], [facility]  - [facility] Address: [address], [place], [place] [address] Phone: [phone]   Please schedule a follow-up appointment with  Dr. Althea CharonKaramalegos in 4 weeks Depression / Insomnia  If you have any other questions or concerns, please feel free to call the clinic or send a message through MyChart. You may also schedule an earlier appointment if necessary.  Kari PilarAlexander Malashia Kamaka, DO [facility]

## 2015-12-08 LAB — VITAMIN D 25 HYDROXY (VIT D DEFICIENCY, FRACTURES): Vit D, 25-Hydroxy: 19 ng/mL — ABNORMAL LOW (ref 30–100)

## 2015-12-08 LAB — HIV ANTIBODY (ROUTINE TESTING W REFLEX): HIV: NONREACTIVE

## 2015-12-08 MED ORDER — VITAMIN D3 1.25 MG (50000 UT) PO CAPS: 50000.0000 [IU] | ORAL_CAPSULE | ORAL | 0 refills | Status: DC

## 2015-12-08 MED ORDER — LEVOTHYROXINE SODIUM 125 MCG PO TABS: 125.0000 ug | ORAL_TABLET | Freq: Every day | ORAL | 5 refills | Status: DC

## 2015-12-08 NOTE — Addendum Note (Signed)
Addended by: Smitty CordsKARAMALEGOS, ALEXANDER J on: 12/08/2015 08:14 AM   Modules accepted: Orders

## 2015-12-12 ENCOUNTER — Telehealth: Payer: Self-pay | Admitting: Family Medicine

## 2015-12-12 NOTE — Telephone Encounter (Signed)
Returned call to patient. She wanted to know if its ok to take Alka seltzer otc. Advised it should be ok to take. These are normal side effects of flu shot.

## 2015-12-12 NOTE — Telephone Encounter (Signed)
Pt said after getting flu shot at last visit she has been achy and having headaches.  Her call back number is [phone]

## 2016-01-04 ENCOUNTER — Ambulatory Visit: Payer: Medicaid Other | Admitting: Family Medicine

## 2016-01-05 ENCOUNTER — Ambulatory Visit: Payer: Medicaid Other | Admitting: Family Medicine

## 2016-01-13 ENCOUNTER — Ambulatory Visit (INDEPENDENT_AMBULATORY_CARE_PROVIDER_SITE_OTHER): Payer: Medicaid Other | Admitting: Family Medicine

## 2016-01-13 ENCOUNTER — Encounter: Payer: Self-pay | Admitting: Family Medicine

## 2016-01-13 VITALS — BP 117/65 | HR 68 | Temp 98.2°F | Resp 16 | Ht 66.0 in | Wt 180.0 lb

## 2016-01-13 DIAGNOSIS — F331 Major depressive disorder, recurrent, moderate: Secondary | ICD-10-CM

## 2016-01-13 DIAGNOSIS — E034 Atrophy of thyroid (acquired): Secondary | ICD-10-CM | POA: Diagnosis not present

## 2016-01-13 DIAGNOSIS — R7303 Prediabetes: Secondary | ICD-10-CM

## 2016-01-13 DIAGNOSIS — E7800 Pure hypercholesterolemia, unspecified: Secondary | ICD-10-CM

## 2016-01-13 DIAGNOSIS — G4726 Circadian rhythm sleep disorder, shift work type: Secondary | ICD-10-CM | POA: Diagnosis not present

## 2016-01-13 DIAGNOSIS — G4701 Insomnia due to medical condition: Secondary | ICD-10-CM

## 2016-01-13 DIAGNOSIS — E559 Vitamin D deficiency, unspecified: Secondary | ICD-10-CM | POA: Diagnosis not present

## 2016-01-13 LAB — HEMOGLOBIN A1C
Hgb A1c MFr Bld: 5.7 % — ABNORMAL HIGH (ref <5.7)
Mean Plasma Glucose: 117 mg/dL

## 2016-01-13 LAB — TSH: TSH: 1.68 mIU/L

## 2016-01-13 MED ORDER — SERTRALINE HCL 100 MG PO TABS: 100.0000 mg | ORAL_TABLET | Freq: Every day | ORAL | 5 refills | Status: DC

## 2016-01-13 NOTE — Patient Instructions (Addendum)
Thank you for coming in to clinic today.  1. For Thyroid - Check TSH thyroid lab today, continue daily for now, most likely we will send a new rx for  2. For Vitamin D deficiency - Continue taking Vitamin D3 50,000 units weekly for next 2 months until finish entire bottle, then start OTC supplement Vitamin D3 2000 daily - Next visit 3 months will re-check Vitamin D  3. For Depression - Keep taking Trazodone 50mg  nightly - Sent new rx for Sertraline 100mg  pills, take 1 daily, you can finish your old sertraline rx for 2 pills daily for now  INSOMNIA - Keep up the good work with going to bed at 0100 or 0130 am, and trying to get at least 5-6 hours of sleep - Your body remembers how much sleep you have lost over months to years, it is called Sleep Debt, you have to catch up on this over time, I expect it may take you several months to feel more rested  We are checking A1c diabetes screening test today  Sleep Hygiene Recommendations to promote healthy sleep in all patients, especially if symptoms of insomnia are worsening. Due to the nature of sleep rhythms, if your body gets "out of rhythm", it may take some time before your sleep cycle can be "reset".  Please try to follow as many of the following tips as you can, usually there are only a few of these are the primary cause of the problem.  ?To reset your sleep rhythm, go to bed and get up at the same time every day ?Sleep only long enough to feel rested and then get out of bed ?Do not try to force yourself to sleep. If you can't sleep, get out of bed and try again later.  ?Have coffee, tea, and other foods that have caffeine only in the morning ?Exercise several days a week, but not right before bed ?If you drink alcohol, avoid alcohol in the late afternoon, evening, and bedtime ?If you smoke, avoid smoking, especially in the evening  ?Avoid watching TV or looking at phones, computers, or reading devices ("e-books") that  give off light at least 30 minutes before bed. This artificial light sends "awake signals" to your brain and can make it harder to fall asleep. ?Keep your bedroom dark, cool, quiet, and free of reminders of other things that cause you stress ?Try your best to solve or at least address your problems before you go to bed   - Recommend contacting a therapist as you planned, to get established, in future if you need a referral let us know.  Psych Counseling / Therapy Recoomendations  Self Referral [facility]) [place] [address], [place], [place] [address] Phone: [phone]  Self Referral:  1. [facility] [place] [facility]   Address: [address], [place], [place] [address] Hours: Open today  9AM-7PM Phone: [phone]  2. Anell Barr [employer], [facility]  - [facility] Address: [address], [place], [place] [address] Phone: [phone]  You will be due for FASTING BLOOD WORK (no food or drink after midnight before, only water or coffee without cream/sugar on the morning of)  - Please go ahead and schedule a "Lab Only" visit in the morning at the clinic for lab draw in 3 months - Make sure Lab Only appointment is at least 1-2 weeks before your next appointment, so that results will be available  For Lab Results, once  available within 2-3 days of blood draw, you can can log in to MyChart online to view your results and a brief explanation. Also, we can discuss results at next follow-up visit.   Please schedule a follow-up appointment with Dr. Althea CharonKaramalegos in 3 months in 04/2016 for Annual Physical  If you have any other questions or concerns, please feel free to call the clinic or send a message through MyChart. You may also schedule an earlier appointment if necessary.  Saralyn PilarAlexander Karamalegos, DO [facility], [facility]

## 2016-01-13 NOTE — Progress Notes (Signed)
Subjective:    Patient ID: Kari Gallagher, female    DOB: 05/24/1980, 36 y.o.   MRN: [id number]  Kari Gallagher is a 36 y.o. 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 However she still endorses significant fatigue and feeling tired currently due to recently working 18 days straight, states she plans to catch up on sleep this weekend. Frustrated with limited time seeing family due to busy work schedule and night shift. - Tolerating medicines well - Has not scheduled therapy or counseling - Regarding her insomnia specifically, she now is going to bed around 0130, falls asleep within 30 min after taking Trazodone, able to get 5-6 hours of sleep before waking up to get kids ready for school, but will go back to sleep vs lay down to rest before work. - Denies any suicidal or homicidal ideation at this time.   HYPOTYROIDISM, FOLLOW-UP: - Last visit 12/07/15 for hypothyroidism, with last TSH lab down to 13 (12/07/15), previously up to 29. She was increased from 112.305mcg daily up to 125mcg daily, has been on this dose for past 4-6 weeks, still thinks she is improved with regards to thyroid but maybe not 100% yet. In past she had been on as much as Levothyroxine 150mcg daily. She describes some thinning of hair and some hair falling out, maybe brittle nails as well. - Still tired but improved now, see below with insomnia - Due for TSH check  VITAMIN D DEFICIENCY: Taking 50k weekly since last visit with persistently low Vit D to 20 to 19 did not improve on 5,000 unit daily for 8 weeks. Tolerating  current dose well. She is planning to take maintenance Vit D3 2,000 daily after finishes her 12 weeks for 50k - taking MVI  Health Maintenance: - Due for TDap, declines today will get at next physical 3 months  Social History  Substance Use Topics  . Smoking status: Former Smoker    Packs/day: 1.00    Types: Cigarettes  . Smokeless tobacco: Never Used  . Alcohol use No    Review of Systems Per HPI unless specifically indicated above  Dep[facility] 01/13/2016 12/07/2015 10/03/2015 08/22/2015  Decreased Interest 2 1 2 2   Down, Depressed, Hopeless 1 1 3 2   PHQ - 2 Score 3 2 5 4   Altered sleeping 1 3 3 3   Tired, decreased energy 2 3 3 3   Change in appetite 3 1 2 2   Feeling bad or failure about yourself  1 0 1 2  Trouble concentrating 3 3 2 2   Moving slowly or fidgety/restless 1 2 1 1   Suicidal thoughts 0 0 0 0  PHQ-9 Score 14 14 17 17   Difficult doing work/chores Not difficult at all Somewhat difficult Very difficult Somewhat difficult        Objective:    BP 117/65   Pulse 68   Temp 98.2 F (36.8 C) (Oral)   Resp 16   Ht 5\' 6"  (1.676 m)   Wt 180 lb (81.6 kg)   BMI 29.05 kg/m   Wt Readings from Last  3 Encounters:  01/13/16 180 lb (81.6 kg)  12/07/15 181 lb (82.1 kg)  10/03/15 171 lb 8 oz (77.8 kg)    Physical Exam  Constitutional: She is oriented to person, place, and time. She appears well-developed and well-nourished. No distress.  Still tired-appearing but somewhat improved, appears well, comfortable, cooperative   HENT:  Head: Normocephalic and atraumatic.  Mouth/Throat: Oropharynx is clear and moist.  Some thin appearing hair  Neck: Normal range of motion. Neck supple. No thyromegaly present.  Cardiovascular: Normal rate, regular rhythm, normal heart sounds and intact distal pulses.   No murmur heard. Pulmonary/Chest: Effort normal and breath sounds normal. No respiratory distress. She has no wheezes. She has no rales.  Lymphadenopathy:    She  has no cervical adenopathy.  Neurological: She is alert and oriented to person, place, and time. Coordination normal.  Skin: Skin is warm and dry. No rash noted. She is not diaphoretic. No erythema.  Psychiatric: Her behavior is normal.  Well groomed. Good positive mood and affect today. Not appearing anxious. No crying spells or tearful. Normal speech and thoughts. Improved alertness and less tired.  Nursing note and vitals reviewed.  I have personally reviewed the below lab results from 08/22/15  Results for orders placed or performed in visit on 01/13/16  TSH  Result Value Ref Range   TSH 1.68 mIU/L  Hemoglobin A1c  Result Value Ref Range   Hgb A1c MFr Bld 5.7 (H) <5.7 %   Mean Plasma Glucose 117 mg/dL      Assessment & Plan:   Problem List Items Addressed This Visit    Vitamin D deficiency    Last Vit D still low 19 despite 8 weeks of 5k daily, now has been on VitD3 50k weekly, continue for 12 weeks, then start maintenance OTC VitD3 2k daily - Check VitD in 3 months      Relevant Orders   VITAMIN D 25 Hydroxy (Vit-D Deficiency, Fractures)   Shift work sleep disorder    Suspect primary etiology with poor sleep, now insomnia and depression improving, thyroid controlled. Improved ability to get 5-6 hours sleep now instead of 3 - Continue Trazodone, Sertraline - Follow-up      Pre-diabetes    Newly diagnosed elevated A1c 5.7 with h/o abnormal glucose in past >100 Likely may be contributing to some fatigue Encourage lifestyle modifications A1c in 3 months      Relevant Orders   Hemoglobin A1c (Completed)   COMPLETE METABOLIC PANEL WITH GFR   Hemoglobin A1c   Insomnia    Recently improved on Trazodone, sleeping up to 5-6 hours night, primarily limited by long hour night shift schedule, also secondary to depression - Likely has large amount of sleep debt currently, will take a while to repay  Plan: 1. Continue Trazodone 50mg  bedtime, titrate up as needed in future 2.  Continue Zoloft 100mg  3. Continue Sleep Hygiene 4. Continue plan to go to bed around 0100-0130 to allow herself to get more sleep 5. Follow-up 3 months      Hypothyroidism - Primary    Still improved on Levothyroxine now daily, but still symptoms of tired, fatigue, wt gain, thinning hair losing hair. However largely suspect fatigue related symptoms with insomnia / shiftwork sleep disorder. - TSH trend 29 > last 13  Plan: 1. Check TSH today - 1.68, appears appropriate dose 2. Continue levothyroxine , will not increase, notify patient to continue 3. Treat underlying depression, sleep disorder 4. Follow-up, consider repeat TSH  3 months at physical, then can space out to q 12 months if stable      Relevant Orders   TSH (Completed)   TSH   Depression    Still mild improvement in MDD on Sertraline now 100mg , difficult to determine all symptoms due to significant insomnia with shiftwork sleep disorder, now hypothyroid should be corrected.  Plan: 1. Continue Trazodone 50mg  bedtime - for depression and sleep 2. Continue Zoloft 100mg  daily, sent refill new 100mg  pills 3. Again advised to follow-up with therapist in future 4. Emphasized importance of improved sleep in order to help her mood and function, see A&P 5. Follow-up 3 months annual physical      Relevant Medications   sertraline (ZOLOFT) 100 MG tablet   Other Relevant Orders   COMPLETE METABOLIC PANEL WITH GFR    Other Visit Diagnoses    Pure hypercholesterolemia       Relevant Orders   Lipid panel      Meds ordered this encounter  Medications  . sertraline (ZOLOFT) 100 MG tablet    Sig: Take 1 tablet (100 mg total) by mouth daily.    Dispense:  30 tablet    Refill:  5      Follow up plan: Return in about 3 months (around 04/12/2016) for Annual Physical.  [name], DO [facility] 01/14/2016, 9:55 AM

## 2016-01-14 DIAGNOSIS — R7303 Prediabetes: Secondary | ICD-10-CM | POA: Insufficient documentation

## 2016-01-14 NOTE — Assessment & Plan Note (Addendum)
Newly diagnosed elevated A1c 5.7 with h/o abnormal glucose in past >100 Likely may be contributing to some fatigue Encourage lifestyle modifications A1c in 3 months

## 2016-01-14 NOTE — Assessment & Plan Note (Signed)
Last Vit D still low 19 despite 8 weeks of 5k daily, now has been on VitD3 50k weekly, continue for 12 weeks, then start maintenance OTC VitD3 2k daily - Check VitD in 3 months

## 2016-01-14 NOTE — Assessment & Plan Note (Addendum)
Recently improved on Trazodone, sleeping up to 5-6 hours night, primarily limited by long hour night shift schedule, also secondary to depression - Likely has large amount of sleep debt currently, will take a while to repay  Plan: 1. Continue Trazodone 50mg  bedtime, titrate up as needed in future 2. Continue Zoloft 100mg  3. Continue Sleep Hygiene 4. Continue plan to go to bed around 0100-0130 to allow herself to get more sleep 5. Follow-up 3 months

## 2016-01-14 NOTE — Assessment & Plan Note (Signed)
Suspect primary etiology with poor sleep, now insomnia and depression improving, thyroid controlled. Improved ability to get 5-6 hours sleep now instead of 3 - Continue Trazodone, Sertraline - Follow-up

## 2016-01-14 NOTE — Assessment & Plan Note (Signed)
Still mild improvement in MDD on Sertraline now 100mg , difficult to determine all symptoms due to significant insomnia with shiftwork sleep disorder, now hypothyroid should be corrected.  Plan: 1. Continue Trazodone 50mg  bedtime - for depression and sleep 2. Continue Zoloft 100mg  daily, sent refill new 100mg  pills 3. Again advised to follow-up with therapist in future 4. Emphasized importance of improved sleep in order to help her mood and function, see A&P 5. Follow-up 3 months annual physical

## 2016-01-14 NOTE — Assessment & Plan Note (Signed)
Still improved on Levothyroxine now daily, but still symptoms of tired, fatigue, wt gain, thinning hair losing hair. However largely suspect fatigue related symptoms with insomnia / shiftwork sleep disorder. - TSH trend 29 > last 13  Plan: 1. Check TSH today - 1.68, appears appropriate dose 2. Continue levothyroxine , will not increase, notify patient to continue 3. Treat underlying depression, sleep disorder 4. Follow-up, consider repeat TSH 3 months at physical, then can space out to q 12 months if stable

## 2016-03-08 ENCOUNTER — Emergency Department
Admission: EM | Admit: 2016-03-08 | Discharge: 2016-03-08 | Disposition: A | Discharge status: Home / Self Care | Payer: Medicaid Other | Attending: Emergency Medicine | Admitting: Emergency Medicine

## 2016-03-08 ENCOUNTER — Emergency Department: Payer: Medicaid Other

## 2016-03-08 ENCOUNTER — Encounter: Payer: Self-pay | Admitting: Emergency Medicine

## 2016-03-08 DIAGNOSIS — R0789 Other chest pain: Secondary | ICD-10-CM | POA: Diagnosis not present

## 2016-03-08 DIAGNOSIS — M7989 Other specified soft tissue disorders: Secondary | ICD-10-CM | POA: Diagnosis not present

## 2016-03-08 DIAGNOSIS — Z87891 Personal history of nicotine dependence: Secondary | ICD-10-CM | POA: Diagnosis not present

## 2016-03-08 DIAGNOSIS — E039 Hypothyroidism, unspecified: Secondary | ICD-10-CM | POA: Diagnosis not present

## 2016-03-08 DIAGNOSIS — R079 Chest pain, unspecified: Secondary | ICD-10-CM | POA: Diagnosis present

## 2016-03-08 DIAGNOSIS — Z79899 Other long term (current) drug therapy: Secondary | ICD-10-CM | POA: Insufficient documentation

## 2016-03-08 DIAGNOSIS — R519 Headache, unspecified: Secondary | ICD-10-CM

## 2016-03-08 DIAGNOSIS — R51 Headache: Secondary | ICD-10-CM | POA: Insufficient documentation

## 2016-03-08 LAB — CBC
HEMATOCRIT: 38.4 % (ref 35.0–47.0)
HEMOGLOBIN: 13 g/dL (ref 12.0–16.0)
MCH: 28.5 pg (ref 26.0–34.0)
MCHC: 33.8 g/dL (ref 32.0–36.0)
MCV: 84.4 fL (ref 80.0–100.0)
Platelets: 220 10*3/uL (ref 150–440)
RBC: 4.55 MIL/uL (ref 3.80–5.20)
RDW: 14 % (ref 11.5–14.5)
WBC: 5 10*3/uL (ref 3.6–11.0)

## 2016-03-08 LAB — TROPONIN I

## 2016-03-08 LAB — BASIC METABOLIC PANEL
ANION GAP: 6 (ref 5–15)
BUN: 13 mg/dL (ref 6–20)
CALCIUM: 9.1 mg/dL (ref 8.9–10.3)
CO2: 26 mmol/L (ref 22–32)
Chloride: 107 mmol/L (ref 101–111)
Creatinine, Ser: 0.94 mg/dL (ref 0.44–1.00)
Glucose, Bld: 117 mg/dL — ABNORMAL HIGH (ref 65–99)
POTASSIUM: 3.7 mmol/L (ref 3.5–5.1)
SODIUM: 139 mmol/L (ref 135–145)

## 2016-03-08 MED ORDER — IBUPROFEN 600 MG PO TABS: 600.0000 mg | ORAL_TABLET | Freq: Three times a day (TID) | ORAL | 0 refills | Status: DC | PRN

## 2016-03-08 MED ORDER — SUMATRIPTAN SUCCINATE 50 MG PO TABS: 50.0000 mg | ORAL_TABLET | ORAL | 0 refills | Status: DC | PRN

## 2016-03-08 MED ORDER — IBUPROFEN 600 MG PO TABS
600.0000 mg | ORAL_TABLET | Freq: Once | ORAL | Status: AC
  Administered 2016-03-08: 600 mg via ORAL
  Filled 2016-03-08: qty 1

## 2016-03-08 NOTE — ED Provider Notes (Signed)
[facility] Emergency Department Provider Note  ____________________________________________   First MD Initiated Contact with Patient 03/08/16 1642     (approximate)  I have reviewed the triage vital signs and the nursing notes.   HISTORY  Chief Complaint Chest Pain and Leg Swelling    HPI Kari Gallagher is a 36 y.o. female who comes to the emergency departmentwith 1 week of bilateral lower extremity swelling and 3 days of sharp intermittent left-sided nonradiating chest pain. Pain is worse when lying flat and improved with sitting up. It is not crushing. It is not ripping or tearing and does not go to her back. She has not had shortness of breath. No history of DVT or pulmonary embolism. She does have a strong family history of coronary artery disease.   Past Medical History:  Diagnosis Date  . Depression   . Frequent headaches   . Hypothyroidism     Patient Active Problem List   Diagnosis Date Noted  . Pre-diabetes 01/14/2016  . Insomnia 12/07/2015  . 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 Depression 10/03/2015  . Thyroid disease 08/22/2015  . Encounter to establish care 08/22/2015  . Hypothyroidism 08/22/2015  . Vitamin D deficiency 08/22/2015  . Pedal edema 08/22/2015    Past Surgical History:  Procedure Laterality Date  . CHOLECYSTECTOMY    . TUBAL LIGATION  2006    Prior to Admission medications   Medication Sig Start Date End Date Taking? Authorizing Provider  Cholecalciferol (VITAMIN D3) 2000 units capsule Take 1 capsule (2,000 Units total) by mouth daily. 12/07/15   Smitty Cords, DO  Cholecalciferol (VITAMIN D3) 50000 units CAPS Take 50,000 Units by mouth once a week. For 12 weeks, then resume Vitamin D3 2,000 daily once finished. 12/08/15   Smitty Cords, DO  ibuprofen (ADVIL,MOTRIN) 600 MG tablet Take 1 tablet (600 mg total) by mouth every 8 (eight) hours as needed. 03/08/16   Merrily Brittle, MD    levothyroxine (SYNTHROID, LEVOTHROID) 125 MCG tablet Take 1 tablet (125 mcg total) by mouth daily before breakfast. 12/08/15   Smitty Cords, DO  sertraline (ZOLOFT) 100 MG tablet Take 1 tablet (100 mg total) by mouth daily. 01/13/16   Smitty Cords, DO  SUMAtriptan (IMITREX) 50 MG tablet Take 1 tablet (50 mg total) by mouth every 2 (two) hours as needed for migraine. May repeat in 2 hours if headache persists or recurs. 03/08/16   Merrily Brittle, MD  traZODone (DESYREL) 50 MG tablet Take 1 tablet (50 mg total) by mouth at bedtime. 12/07/15   Smitty Cords, DO    Allergies Patient has no known allergies.  Family History  Problem Relation Age of Onset  . Stroke Mother   . Depression Mother   . Mental illness Mother   . Heart disease Father   . Thyroid disease Father   . Lung cancer Father   . Thyroid disease Brother     Social History Social History  Substance Use Topics  . Smoking status: Former Smoker    Packs/day: 1.00    Types: Cigarettes  . Smokeless tobacco: Never Used  . Alcohol use No    Review of Systems Constitutional: No fever/chills Eyes: No visual changes. ENT: No sore throat. Cardiovascular: Positive chest pain. Respiratory: Denies shortness of breath. Gastrointestinal: No abdominal pain.  No nausea, no vomiting.  No diarrhea.  No constipation. Genitourinary: Negative for dysuria. Musculoskeletal: Negative for back pain. Skin: Negative for rash. Neurological: Positive for  headaches, negative for focal weakness or numbness.  10-point ROS otherwise negative.  ____________________________________________   PHYSICAL EXAM:  VITAL SIGNS: ED Triage Vitals  Enc Vitals Group     BP 03/08/16 1212 129/73     Pulse Rate 03/08/16 1212 75     Resp 03/08/16 1212 18     Temp 03/08/16 1212 98.2 F (36.8 C)     Temp src --      SpO2 03/08/16 1212 100 %     Weight 03/08/16 1213 180 lb (81.6 kg)     Height 03/08/16 1213 5\' 5"  (1.651 m)      Head Circumference --      Peak Flow --      Pain Score --      Pain Loc --      Pain Edu? --      Excl. in GC? --     Constitutional: Alert and oriented x 4 well appearing nontoxic no diaphoresis speaks in full, clear sentences Eyes: PERRL EOMI. Head: Atraumatic. Nose: No congestion/rhinnorhea. Mouth/Throat: No trismus Neck: No stridor.   Cardiovascular: Normal rate, regular rhythm. Grossly normal heart sounds.  Good peripheral circulation.Able to lie completely flat no JVD Respiratory: Normal respiratory effort.  No retractions. Lungs CTAB and moving good air Gastrointestinal: Soft nondistended nontender no rebound no guarding no peritonitis no McBurney's tenderness negative Rovsing's no costovertebral tenderness negative Murphy's Musculoskeletal: No lower extremity edema  Legs are equal in size to Neurologic:  Normal speech and language. No gross focal neurologic deficits are appreciated. Skin:  Skin is warm, dry and intact. No rash noted. Psychiatric: Mood and affect are normal. Speech and behavior are normal.  ____________________________________________   LABS (all labs ordered are listed, but only abnormal results are displayed)  Labs Reviewed  BASIC METABOLIC PANEL - Abnormal; Notable for the following:       Result Value   Glucose, Bld 117 (*)    All other components within normal limits  CBC  TROPONIN I   ____________________________________________  EKG  ED ECG REPORT I, Merrily BrittleNeil Arlon Bleier, the attending physician, personally viewed and interpreted this ECG.  Date: 03/08/2016 Rate: 72 Rhythm: normal sinus rhythm QRS Axis: normal Intervals: normal ST/T Wave abnormalities: normal Conduction Disturbances: none Narrative Interpretation: unremarkable  ____________________________________________  RADIOLOGY  Chest x-ray with no acute disease ____________________________________________   PROCEDURES  Procedure(s) performed:  no  Procedures  Critical Care performed: no  ____________________________________________   INITIAL IMPRESSION / ASSESSMENT AND PLAN / ED COURSE  Pertinent labs & imaging results that were available during my care of the patient were reviewed by me and considered in my medical decision making (see chart for details).  The patient is very well-appearing with atypical chest pain in a heart score of 1. She is PERC negative.  I had a lengthy discussion with the patient regarding her diagnosis and she is given reassurance.     ----------------------------------------- 5:32 PM on 03/08/2016 -----------------------------------------  I went into discharge the patient for her low risk chest pain, but upon discharge she told me that for the past several weeks she has had new headaches that she has never had before. They're gradual onset and not maximal onset left frontal throbbing headaches. Associated with photophobia. She does have an aura prior to the headaches. At this point her pain is resolved and she is medically stable for outpatient management with follow-up with her primary care physician next month as are the schedule. ____________________________________________   FINAL CLINICAL IMPRESSION(S) /  ED DIAGNOSES  Final diagnoses:  Atypical chest pain  Nonintractable headache, unspecified chronicity pattern, unspecified headache type      NEW MEDICATIONS STARTED DURING THIS VISIT:  New Prescriptions   IBUPROFEN (ADVIL,MOTRIN) 600 MG TABLET    Take 1 tablet (600 mg total) by mouth every 8 (eight) hours as needed.   SUMATRIPTAN (IMITREX) 50 MG TABLET    Take 1 tablet (50 mg total) by mouth every 2 (two) hours as needed for migraine. May repeat in 2 hours if headache persists or recurs.     Note:  This document was prepared using Dragon voice recognition software and may include unintentional dictation errors.     Merrily Brittle, MD 03/08/16 1740

## 2016-03-08 NOTE — ED Notes (Signed)
Pt states bilat leg and arm swelling x 1 week, states HA x 2 days and CP x 3 days. States middle and L chest pain with neck tightness. Denies hx of DVT. Alert, oriented x 4. States SOB while lying flat.

## 2016-03-08 NOTE — ED Triage Notes (Signed)
States arm and leg swelling bilaterally x 1 week and chest pain at night x 3 days while she is working.

## 2016-03-08 NOTE — Discharge Instructions (Signed)
Please take your ibuprofen as needed for chest pain. It is possible that the headaches you're describing may be migraine headaches. Please take the Imitrex that I am prescribing only when your headache begins. Otherwise follow up with her primary care physician next month as scheduled for reevaluation. Return to the emergency department sooner for any new or worsening symptoms such as worsening chest pain, shortness of breath, or for any other concerns.  Results for orders placed or performed during the hospital encounter of 03/08/16  Basic metabolic panel  Result Value Ref Range   Sodium 139 135 - 145 mmol/L   Potassium 3.7 3.5 - 5.1 mmol/L   Chloride 107 101 - 111 mmol/L   CO2 26 22 - 32 mmol/L   Glucose, Bld 117 (H) 65 - 99 mg/dL   BUN 13 6 - 20 mg/dL   Creatinine, Ser 6.440.94 0.44 - 1.00 mg/dL   Calcium 9.1 8.9 - 03.410.3 mg/dL   GFR calc non Af Amer >60 >60 mL/min   GFR calc Af Amer >60 >60 mL/min   Anion gap 6 5 - 15  CBC  Result Value Ref Range   WBC 5.0 3.6 - 11.0 K/uL   RBC 4.55 3.80 - 5.20 MIL/uL   Hemoglobin 13.0 12.0 - 16.0 g/dL   HCT 74.238.4 59.535.0 - 63.847.0 %   MCV 84.4 80.0 - 100.0 fL   MCH 28.5 26.0 - 34.0 pg   MCHC 33.8 32.0 - 36.0 g/dL   RDW 75.614.0 43.311.5 - 29.514.5 %   Platelets 220 150 - 440 K/uL  Troponin I  Result Value Ref Range   Troponin I <0.03 <0.03 ng/mL   Dg Chest 2 View  Result Date: 03/08/2016 CLINICAL DATA:  36 year old female with bilateral extremity swelling for 1 week. Chest pain for 3 days. Initial encounter. EXAM: CHEST  2 VIEW COMPARISON:  04/10/2011 and earlier. FINDINGS: Large body habitus. Normal lung volumes. Normal cardiac size and mediastinal contours. Visualized tracheal air column is within normal limits. The lungs are clear. No pneumothorax or pleural effusion. Stable cholecystectomy clips. Negative visible bowel gas pattern. No osseous abnormality identified. IMPRESSION: Negative.  No acute cardiopulmonary abnormality. Electronically Signed   By: Odessa FlemingH  Hall M.D.    On: 03/08/2016 13:28

## 2016-03-09 DIAGNOSIS — R079 Chest pain, unspecified: Secondary | ICD-10-CM | POA: Diagnosis not present

## 2016-03-15 ENCOUNTER — Inpatient Hospital Stay: Payer: Medicaid Other | Admitting: Family Medicine

## 2016-03-22 ENCOUNTER — Ambulatory Visit: Payer: Medicaid Other | Admitting: Family Medicine

## 2016-03-26 ENCOUNTER — Telehealth: Payer: Self-pay | Admitting: *Deleted

## 2016-03-26 ENCOUNTER — Ambulatory Visit (INDEPENDENT_AMBULATORY_CARE_PROVIDER_SITE_OTHER): Payer: Self-pay | Admitting: Family Medicine

## 2016-03-26 ENCOUNTER — Encounter: Payer: Self-pay | Admitting: Family Medicine

## 2016-03-26 VITALS — BP 122/76 | HR 75 | Temp 98.6°F | Resp 16 | Ht 66.0 in | Wt 186.0 lb

## 2016-03-26 DIAGNOSIS — R609 Edema, unspecified: Secondary | ICD-10-CM | POA: Insufficient documentation

## 2016-03-26 DIAGNOSIS — R0789 Other chest pain: Secondary | ICD-10-CM

## 2016-03-26 DIAGNOSIS — G43101 Migraine with aura, not intractable, with status migrainosus: Secondary | ICD-10-CM

## 2016-03-26 MED ORDER — SUMATRIPTAN 20 MG/ACT NA SOLN: 20.0000 mg | NASAL | 1 refills | Status: DC | PRN

## 2016-03-26 MED ORDER — FUROSEMIDE 20 MG PO TABS: ORAL_TABLET | ORAL | 0 refills | Status: DC

## 2016-03-26 NOTE — Assessment & Plan Note (Signed)
Unclear exact etiology, concern with bilateral upper and lower ext, does not seem to be chronic problem - Worse recently with other issues such as migraine , atypical chest pain - Prior labs unremarkable kidney, liver, in past ECHO was normal 2015 - Family history of fluid retention  Plan: 1. Start Lasix 20mg  daily 3-5 days then may repeat if needed, only short course trial 2. Future may need HCTZ if persistent edema, however no HTN - await further cardiology eval, may need update ECHO

## 2016-03-26 NOTE — Telephone Encounter (Signed)
Attempted to contact patient on both contact numbers. Patient does not have insurance. Trying to contact her to see if she wants to proceed with cardiology referral right now.  Not able to leave a message.

## 2016-03-26 NOTE — Assessment & Plan Note (Signed)
Concern with persistent recurrent episodes of atypical chest pain with some concerning features in setting of strong family history of CVD, especially sibling age 37 with MI. Patient with prior ECHO mostly normal in 2015 without obvious abnormality, but reports a history of prior "valvular leakage", not clear on this. - Additional concerns with newer dx migraine HA and some peripheral edema  Plan: 1. Given recent ED work-up with neg troponin and EKG, reassuring initially, however given persistence and her family history I do believe patient would warrant further cardiac work-up may need stress or other work-up prior to cardiac clearance to return to work 2. [facility], office to contact today to notify them of Urgent Referral

## 2016-03-26 NOTE — Progress Notes (Addendum)
Subjective:    Patient ID: Kari Gallagher, female    DOB: 01-Mar-1980, 36 y.o.   MRN: [id number]  Kari Gallagher is a 36 y.o. female presenting on 03/26/2016 for Hospitalization Follow-up (still has some chest pain onset 03/01/2016  and HA and bodyache could be from weight gain  and Right shoulder hurts and swelling in both feets and arms onset month and half)   HPI   Follow-up ED Headaches, Suspected Migraines, new onset - Additionally while in ED 03/08/16 she was also treated for Migraine Headaches, at that time given rx Ibuprofen 600mg  TID and Imitrex 50mg  tabs. She never filled Imitrex rx, was waiting to discuss with me, and she is only taking Ibuprofen 600mg  once daily and not regularly, it does provide mild relief but does not resolve headache. Not taking any other headache meds, including Tylenol, Excedrin migraine. She was concerned about med interaction with her other meds - Describes recent onset problem of worsening headaches over past 4-6 weeks, she has had some chronic intermittent headaches in the past, never dx with migraines, always thought "tension headache". These headaches now started L or R sided alternating, then radiate to bilateral frontal temples, with throbbing pain, usually intermittent lasting several hours at a time, recur few times a week, longest without HA 2-3 days recently. - Admits to some associated vision color changes or possible aura, not always before headache - No significant dx migarine or migraine treatment in past. Did have prior ED visit headaches 06/2015 - No known triggers - Admits photophobia, phonophobia, (HA improved with dark room without sounds), worst with activity - Admits occasional dizziness, self limited, without vertigo - Denies any nausea, vomiting, neurological symptoms, tingling, weakness  PERIPHERAL EDEMA: Reports more recent concern with some swelling in hands/fingers, and feet lower extremity. Unsure exact timeline seems to be about  2-3 weeks now, has not had chronic problems with swelling. No recent provoking factors. No recent surgery, immobilization. No personal or family history of clot, DVT / PE. Discussion on this in ED back on 03/08/16, she was PERC negative, and did not have any other imaging testing at that time. - Has family history with some fluid retention, other family members on fluid pill - She has not been on medicine for this before - Seems intermittent worsening, not related to headaches or chest pain specifically  FOLLOW-UP ED ATYPICAL CHEST PAIN: - Seen at [facility] ED 03/08/16 for acute concern of 3 days left sided sharp intermittent chest pain, also related to swelling and headaches. She had testing with labs, chemistry, CBC, unremarkable, troponin negative, and EKG unremarkable, Chest X-ray normal. She was discharged with ibuprofen. - Today patient reports no active chest pain at this time. But still has had some recurrence of similar pains since discharge from ED, last episode last night, while resting, it is atypical occurs not only with exertion, can be at rest. Usually lasts 5-10+ minutes, usually not >20 min, not radiating, feels more like tightness and pressure, or "spasm", also substernal vs Left sided only without radiation - In past she had seen [facility] near [facility], had ECHO done, was told "had some leakage in heart valve", although do not see on last ECHO report in 2015 - She is still tired with poor sleep and night shift working, now can't return to job until evaluated further for her heart - She is concerned about significant family history of cardiac disease, with her sister age 67 having an MI recently and in  ICU, other sibling had heart problems on disability, and parents with cardiac disease - Denies active chest pain or dyspnea, orthopnea, syncope  Additionally with R sided shoulder pain, not focus of visit today.   Social History  Substance Use Topics  . Smoking status: Former Smoker     Packs/day: 1.00    Types: Cigarettes  . Smokeless tobacco: Never Used  . Alcohol use No    Review of Systems Per HPI unless specifically indicated above     Objective:    BP 122/76   Pulse 75   Temp 98.6 F (37 C) (Oral)   Resp 16   Ht 5\' 6"  (1.676 m)   Wt 186 lb (84.4 kg)   LMP 03/07/2016   BMI 30.02 kg/m   Wt Readings from Last 3 Encounters:  03/26/16 186 lb (84.4 kg)  03/08/16 180 lb (81.6 kg)  01/13/16 180 lb (81.6 kg)    Physical Exam  Constitutional: She is oriented to person, place, and time. She appears well-developed and well-nourished. No distress.  Chronically tired-appearing, currently appears well, comfortable, cooperative   HENT:  Head: Normocephalic and atraumatic.  Mouth/Throat: Oropharynx is clear and moist.  Frontal / maxillary sinuses non-tender. Nares patent without purulence or edema. Oropharynx clear without erythema, exudates, edema or asymmetry.  Eyes: Conjunctivae are normal. Right eye exhibits no discharge. Left eye exhibits no discharge.  Neck: Normal range of motion. Neck supple. No thyromegaly present.  Cardiovascular: Normal rate, regular rhythm, normal heart sounds and intact distal pulses.   No murmur heard. Pulmonary/Chest: Effort normal and breath sounds normal. No respiratory distress. She has no wheezes. She has no rales.  Good air movement  Musculoskeletal: She exhibits edema (mild non pitting bilateral fingers hands and wrists, also lower extremity mostly pedal/ankle edema, non pitting, lower legs and calves do not appear edematous).  Lower extremity calves non tender, symmetrical  Lymphadenopathy:    She has no cervical adenopathy.  Neurological: She is alert and oriented to person, place, and time.  Skin: Skin is warm and dry. No rash noted. She is not diaphoretic. No erythema.  Psychiatric: Her behavior is normal.  Well groomed, good eye contact, normal speech and thoughts  Nursing note and vitals reviewed.    I have  personally reviewed the following lab results from 03/08/16.  Results for orders placed or performed during the hospital encounter of 03/08/16  Basic metabolic panel  Result Value Ref Range   Sodium 139 135 - 145 mmol/L   Potassium 3.7 3.5 - 5.1 mmol/L   Chloride 107 101 - 111 mmol/L   CO2 26 22 - 32 mmol/L   Glucose, Bld 117 (H) 65 - 99 mg/dL   BUN 13 6 - 20 mg/dL   Creatinine, Ser 4.09 0.44 - 1.00 mg/dL   Calcium 9.1 8.9 - 81.1 mg/dL   GFR calc non Af Amer >60 >60 mL/min   GFR calc Af Amer >60 >60 mL/min   Anion gap 6 5 - 15  CBC  Result Value Ref Range   WBC 5.0 3.6 - 11.0 K/uL   RBC 4.55 3.80 - 5.20 MIL/uL   Hemoglobin 13.0 12.0 - 16.0 g/dL   HCT 91.4 78.2 - 95.6 %   MCV 84.4 80.0 - 100.0 fL   MCH 28.5 26.0 - 34.0 pg   MCHC 33.8 32.0 - 36.0 g/dL   RDW 21.3 08.6 - 57.8 %   Platelets 220 150 - 440 K/uL  Troponin I  Result Value Ref  Range   Troponin I <0.03 <0.03 ng/mL   I have personally reviewed the imaging report from last ECHO on 05/01/13  Transthoracic Echocardiography  Patient:  Onia, Shiflett MR #:    [id number] Study Date: 05/01/2013 Gender:   F Age:    32 Height:   165.1cm Weight:   72.1kg BSA:    1.26m^2 Pt. Status: Room:  ATTENDING  Default, Provider W Leonard Downing, Fayegh REFERRING  Sherrie Mustache SONOGRAPHER Weston Settle, RDCS, RVT PERFORMING  [facility], [place] cc:  ------------------------------------------------------------ LV EF: 60% -  65%  ------------------------------------------------------------ History:  PMH: Acquired from the patient and from the patient's chart. Chest pain. Dyspnea and generalized edema. Risk factors: Current tobacco use.  ------------------------------------------------------------ Study Conclusions  - Left ventricle: The cavity size was normal. Systolic function was normal. The estimated ejection fraction was in the range of 60% to 65%. Wall motion was normal;  there were no regional wall motion abnormalities. Left ventricular diastolic function parameters were normal. - Pulmonary arteries: Systolic pressure was within the normal range. Impressions:  - Normal study.      Assessment & Plan:   Problem List Items Addressed This Visit    Peripheral edema    Unclear exact etiology, concern with bilateral upper and lower ext, does not seem to be chronic problem - Worse recently with other issues such as migraine , atypical chest pain - Prior labs unremarkable kidney, liver, in past ECHO was normal 2015 - Family history of fluid retention  Plan: 1. Start Lasix 20mg  daily 3-5 days then may repeat if needed, only short course trial 2. Future may need HCTZ if persistent edema, however no HTN - await further cardiology eval, may need update ECHO      Relevant Medications   furosemide (LASIX) 20 MG tablet   Migraine with aura and with status migrainosus, not intractable - Primary    Consistent with recent worsening recurrent migraine headaches with typical features and suspected aura, several times weekly, now worse over past 4-8 weeks. Unclear trigger - Currently with only mild active HA, well-appearing, no focal neuro deficits, tolerating PO w/o n/v - Inadequately treated for migraine HA at home - non adherent to rx ibuprofen / imitrex  Plan: 1. Start abortive therapy with Sumatriptan - switch from tabs to nasal spray to avoid potential chest pain symptom / adverse reaction. She never filled tab rx, sent nasal spray now, 20mg  may repeat dose in 2 hour if still HA for max dose. 2. Recommend taking Ibuprofen 600-800mg  q 8 hr PRN, and can try Tylenol 1000mg  TID alternatively, Excedrin Migraine PRN 3. Avoid triggers including foods, caffeine. Important to rest - likely worse migraine with her very poor sleep 4. Return criteria given for acute migraine, when to go to office vs ED      Relevant Medications   SUMAtriptan (IMITREX) 20 MG/ACT  nasal spray   furosemide (LASIX) 20 MG tablet   Chest pain, atypical    Concern with persistent recurrent episodes of atypical chest pain with some concerning features in setting of strong family history of CVD, especially sibling age 54 with MI. Patient with prior ECHO mostly normal in 2015 without obvious abnormality, but reports a history of prior "valvular leakage", not clear on this. - Additional concerns with newer dx migraine HA and some peripheral edema  Plan: 1. Given recent ED work-up with neg troponin and EKG, reassuring initially, however given persistence and her family history I do believe patient would warrant further cardiac  work-up may need stress or other work-up prior to cardiac clearance to return to work 2. [facility], office to contact today to notify them of Urgent Referral  UPDATE - Note patient does not have any active insurance at this time, previously on medicaid, however this is no longer active, we are unable to process Cardiology referral at this time, patient to be notified that she needs to re-activate medicaid coverage before referral, or she would be self pay if willing, otherwise limited options at this time, notified to go to ED if acute worsening chest pain or other worsening symptoms       Relevant Orders   Ambulatory referral to Cardiology      Meds ordered this encounter  Medications  . SUMAtriptan (IMITREX) 20 MG/ACT nasal spray    Sig: Place 1 spray (20 mg total) into the nose every 2 (two) hours as needed for migraine or headache. May repeat in 2 hours if headache persists    Dispense:  1 Inhaler    Refill:  1  . furosemide (LASIX) 20 MG tablet    Sig: Taking one pill Lasix 20mg  daily for 3-5 days, may repeat dose in future if needed    Dispense:  10 tablet    Refill:  0      Follow up plan: Return in about 4 weeks (around 04/23/2016) for migraine, edema.  Already scheduled for 04/10/16 labs then follow-up for  Annual physical, among other issues can follow-up these problems at that time.  Saralyn PilarAlexander Ami Thornsberry, DO [facility] 03/26/2016, 12:59 PM

## 2016-03-26 NOTE — Patient Instructions (Signed)
Thank you for coming in to clinic today.  For your chest pain. Urgent referral to Cardiologist to review this problem  They will call you with appointment. May need further testing  [facility] at [facility] [address] [place], [place] [address] Main: [phone]   If you don't hear back within 1 weeks, call them, then if needed can call us ------------------------------------- If you have any significant chest pain that does not go away within 30 minutes, is accompanied by nausea, sweating, shortness of breath, or made worse by activity, this may be evidence of a heart attack, especially if symptoms worsening instead of improving, please call 911 or go directly to the emergency room immediately for evaluation. ------------------------------------------  For migraine headaches - Start taking Ibuprofen 600mg  (max dose up to 800mg ) every 8 hours or 3 times a day with food - Start Sumatriptan nasal spray, NEW rx sent to pharmacy, one dose one spray in one nostril, may repeat in 2 hours if needed  Fluid pill take only daily for few days then may repeat course if needed   Please schedule a follow-up appointment with Dr. Althea CharonKaramalegos in 2-4 weeks for Migraine Headache / Swelling  If you have any other questions or concerns, please feel free to call the clinic or send a message through MyChart. You may also schedule an earlier appointment if necessary.  Saralyn PilarAlexander Cinderella Christoffersen, DO [facility] [place]

## 2016-03-26 NOTE — Assessment & Plan Note (Signed)
Consistent with recent worsening recurrent migraine headaches with typical features and suspected aura, several times weekly, now worse over past 4-8 weeks. Unclear trigger - Currently with only mild active HA, well-appearing, no focal neuro deficits, tolerating PO w/o n/v - Inadequately treated for migraine HA at home - non adherent to rx ibuprofen / imitrex  Plan: 1. Start abortive therapy with Sumatriptan - switch from tabs to nasal spray to avoid potential chest pain symptom / adverse reaction. She never filled tab rx, sent nasal spray now, 20mg  may repeat dose in 2 hour if still HA for max dose. 2. Recommend taking Ibuprofen 600-800mg  q 8 hr PRN, and can try Tylenol 1000mg  TID alternatively, Excedrin Migraine PRN 3. Avoid triggers including foods, caffeine. Important to rest - likely worse migraine with her very poor sleep 4. Return criteria given for acute migraine, when to go to office vs ED

## 2016-04-02 ENCOUNTER — Ambulatory Visit (INDEPENDENT_AMBULATORY_CARE_PROVIDER_SITE_OTHER): Payer: Medicaid Other

## 2016-04-02 ENCOUNTER — Ambulatory Visit (INDEPENDENT_AMBULATORY_CARE_PROVIDER_SITE_OTHER): Payer: Medicaid Other | Admitting: Cardiovascular Disease

## 2016-04-02 ENCOUNTER — Encounter: Payer: Self-pay | Admitting: Cardiovascular Disease

## 2016-04-02 VITALS — BP 112/80 | HR 78 | Ht 65.0 in | Wt 181.5 lb

## 2016-04-02 DIAGNOSIS — M7989 Other specified soft tissue disorders: Secondary | ICD-10-CM

## 2016-04-02 DIAGNOSIS — R0602 Shortness of breath: Secondary | ICD-10-CM | POA: Diagnosis not present

## 2016-04-02 DIAGNOSIS — E079 Disorder of thyroid, unspecified: Secondary | ICD-10-CM | POA: Diagnosis not present

## 2016-04-02 DIAGNOSIS — R079 Chest pain, unspecified: Secondary | ICD-10-CM

## 2016-04-02 DIAGNOSIS — R7303 Prediabetes: Secondary | ICD-10-CM | POA: Diagnosis not present

## 2016-04-02 LAB — EXERCISE TOLERANCE TEST
CSEPEDS: 16 s
CSEPEW: 8.9 METS
CSEPHR: 88 %
CSEPPHR: 162 {beats}/min
Exercise duration (min): 7 min
MPHR: 184 {beats}/min
Rest HR: 69 {beats}/min

## 2016-04-02 MED ORDER — POTASSIUM CHLORIDE ER 10 MEQ PO TBCR: 10.0000 meq | EXTENDED_RELEASE_TABLET | Freq: Every day | ORAL | 4 refills | Status: DC | PRN

## 2016-04-02 MED ORDER — FUROSEMIDE 20 MG PO TABS
20.0000 mg | ORAL_TABLET | Freq: Every day | ORAL | 4 refills | Status: DC | PRN
Start: 2016-04-02 — End: 2016-04-17

## 2016-04-02 NOTE — Patient Instructions (Addendum)
Medication Instructions:   Take lasix and potassium as needed for leg swelling  If you get chest pain, Try soda , tums and pepcid (ranitidine/famotidine)  If muscle pain (hurts with breathing in and pushing on the chest) Take ibuprofen/aleve or advil Ice the area if needed Light stretching   Labwork:  No new labs needed  Testing/Procedures:  We will order a treadmill stress test for chest pain   I recommend watching educational videos on topics of interest to you at:       [email or web address]  Enter code: [email or web address]    Follow-Up: It was a pleasure seeing you in the office today. Please call us if you have new issues that need to be addressed before your next appt.  [phone]  Your physician wants you to follow-up in: as needed  If you need a refill on your cardiac medications before your next appointment, please call your pharmacy.

## 2016-04-02 NOTE — Progress Notes (Signed)
Cardiology Office Note  Date:  04/02/2016   ID:  DAISJA KESSINGER, DOB 06-14-1980, MRN [id number]  PCP:  [name], DO   Chief Complaint  Patient presents with  . OTHER    C/o chest pain, headaches and sob. Pt refused EKG not working at the moment. Meds reviewed verbally with pt.    HPI:  36 year old woman With no significant prior cardiac history who presents by referral from Dr. Althea Charon for consultation of her chest pain, shortness of breath, leg swelling  Recent evaluation in the emergency room at [facility] for atypical chest pain 03/08/2016 Hospital records reviewed Personallyby myself Notes indicating one week of lower extremity swelling, 3 weeks of left side chest pain worse when supine, improved with sitting up Strong family history of coronary disease Per the patient Normal EKG in the emergency room, chest x-ray, Cardiac enzymes negative, discharged home Also reported having headaches, photophobia  She was seen again in the emergency room at [facility] March 9, The next day Workup again was negative Described as sharp pain last a few minutes Normal chest x-ray  Reports having left-sided chest pain symptoms when she is sleeping, feels like a "contraction" in her chest.   She was seen by primary care, given Lasix for several days for leg swelling  leg edema resolved Lost a few pounds of water 187 down to 181 She is afraid the leg swelling will come back as she reports it typically waxes and wanes  No recent stressors, has been out of work one month because of left side chest pain. Husband called her work, they told her not to come back to work until she was cleared.   EKG Personally reviewed by myself on today's visit shows normal sinus rhythm with no significant ST or T-wave changes   Dad with CAD Sister, 32  On Ventilator after Possible pneumothorax  Previous workup reviewed showing Echo in 05/2013 Normal  Nonsmoker x 1 Started smoking late 25, Stopped at age  63, 4 cigs a day  Lab work reviewed personally by myself HBA1C 5.7 TSH 1.68  Old total chol 183, LDL 102   PMH:   has a past medical history of Depression; Frequent headaches; Hypothyroidism; and Leaky heart valve.  PSH:    Past Surgical History:  Procedure Laterality Date  . CHOLECYSTECTOMY    . TUBAL LIGATION  2006    Current Outpatient Prescriptions  Medication Sig Dispense Refill  . Cholecalciferol (VITAMIN D3) 2000 units capsule Take 1 capsule (2,000 Units total) by mouth daily.    . furosemide (LASIX) 20 MG tablet Taking one pill Lasix  daily for 3-5 days, may repeat dose in future if needed 10 tablet 0  . ibuprofen (ADVIL,MOTRIN) 600 MG tablet Take 1 tablet (600 mg total) by mouth every 8 (eight) hours as needed. 30 tablet 0  . levothyroxine (SYNTHROID, LEVOTHROID) 125 MCG tablet Take 1 tablet (125 mcg total) by mouth daily before breakfast. 30 tablet 5  . traZODone (DESYREL) 50 MG tablet Take 1 tablet (50 mg total) by mouth at bedtime. 30 tablet 2  . furosemide (LASIX) 20 MG tablet Take 1 tablet (20 mg total) by mouth daily as needed. 30 tablet 4  . potassium chloride (K-DUR) 10 MEQ tablet Take 1 tablet (10 mEq total) by mouth daily as needed. 30 tablet 4  . sertraline (ZOLOFT) 100 MG tablet Take 1 tablet (100 mg total) by mouth daily. (Patient not taking: Reported on 04/02/2016) 30 tablet 5  . SUMAtriptan (IMITREX)  20 MG/ACT nasal spray Place 1 spray (20 mg total) into the nose every 2 (two) hours as needed for migraine or headache. May repeat in 2 hours if headache persists (Patient not taking: Reported on 04/02/2016) 1 Inhaler 1   No current facility-administered medications for this visit.      Allergies:   Patient has no known allergies.   Social History:  The patient  reports that she has quit smoking. Her smoking use included Cigarettes. She smoked 1.00 pack per day. She has never used smokeless tobacco. She reports that she does not drink alcohol or use drugs.    Family History:   family history includes Depression in her mother; Heart disease in her father; Lung cancer in her father; Mental illness in her mother; Stroke in her mother; Thyroid disease in her brother and father.    Review of Systems: Review of Systems  Constitutional: Negative.   Respiratory: Negative.   Cardiovascular: Negative.   Gastrointestinal: Negative.   Musculoskeletal: Negative.   Neurological: Negative.   Psychiatric/Behavioral: Negative.   All other systems reviewed and are negative.    PHYSICAL EXAM: VS:  BP 112/80 (BP Location: Right Arm, Patient Position: Sitting, Cuff Size: Normal)   Pulse 78   Ht  (1.651 m)   Wt 181 lb 8 oz (82.3 kg)   LMP 03/07/2016   BMI 30.20 kg/m  , BMI Body mass index is 30.2 kg/m. GEN: Well nourished, well developed, in no acute distress  HEENT: normal  Neck: no JVD, carotid bruits, or masses Cardiac: RRR; no murmurs, rubs, or gallops,no edema  Respiratory:  clear to auscultation bilaterally, normal work of breathing GI: soft, nontender, nondistended, + BS MS: no deformity or atrophy  Skin: warm and dry, no rash Neuro:  Strength and sensation are intact Psych: euthymic mood, full affect    Recent Labs: 08/22/2015: ALT 7 01/13/2016: TSH 1.68 03/08/2016: BUN 13; Creatinine, Ser 0.94; Hemoglobin 13.0; Platelets 220; Potassium 3.7; Sodium 139    Lipid Panel Lab Results  Component Value Date   CHOL 183 04/10/2011   HDL 58 04/10/2011   LDLCALC 102 (H) 04/10/2011   TRIG 114 04/10/2011      Wt Readings from Last 3 Encounters:  04/02/16 181 lb 8 oz (82.3 kg)  03/26/16 186 lb (84.4 kg)  03/08/16 180 lb (81.6 kg)       ASSESSMENT AND PLAN:  Chest pain, unspecified type - Plan: Exercise Tolerance Test Atypical left-sided chest pain more concerning for musculoskeletal disorder Pain on inspiration, sharp Unable to exclude pleuritic etiology though also concerning for muscles and ligaments Recommended ice pack for  symptoms, NSAIDs as needed She had normal treadmill stress test today showing no changes concerning for ischemia. She'll be released to go back to work. No further testing needed  Less likely, but If there is concern for reflux disease, recommended Tums with Pepcid as needed, soda with carbonation for gas  Shortness of breath Shortness of breath resolved after taking Lasix for several days with several pound fluid loss. She is out of Lasix, concerned her symptoms may come back We have provided prescription for Lasix with potassium and recommended she take these sparingly only for leg swelling. She does have high fluid intake  Leg swelling Leg edema resolved, less tenderness. Recommended Lasix with potassium only as needed for ankle swelling  Thyroid disease Followed by primary care, on supplemental thyroid medication Recent TSH normal level  Pre-diabetes We have encouraged continued exercise, careful diet management  in an effort to lose weight.   Total encounter time more than 60 minutes  Greater than 50% was spent in counseling and coordination of care with the patient   Disposition:   F/U  As needed   Orders Placed This Encounter  Procedures  . Exercise Tolerance Test     Signed, Dossie Arbour, M.D., Ph.D. 04/02/2016  [facility] [place], [place] [phone]

## 2016-04-03 ENCOUNTER — Other Ambulatory Visit: Payer: Self-pay | Admitting: *Deleted

## 2016-04-03 DIAGNOSIS — E7800 Pure hypercholesterolemia, unspecified: Secondary | ICD-10-CM

## 2016-04-03 DIAGNOSIS — R7303 Prediabetes: Secondary | ICD-10-CM

## 2016-04-03 DIAGNOSIS — E559 Vitamin D deficiency, unspecified: Secondary | ICD-10-CM

## 2016-04-03 DIAGNOSIS — F331 Major depressive disorder, recurrent, moderate: Secondary | ICD-10-CM

## 2016-04-03 DIAGNOSIS — E034 Atrophy of thyroid (acquired): Secondary | ICD-10-CM

## 2016-04-10 ENCOUNTER — Other Ambulatory Visit: Payer: Self-pay

## 2016-04-11 LAB — LIPID PANEL
CHOL/HDL RATIO: 3.1 ratio (ref <5.0)
CHOLESTEROL: 213 mg/dL — AB (ref <200)
HDL: 69 mg/dL (ref >50)
LDL Cholesterol: 122 mg/dL — ABNORMAL HIGH (ref <100)
TRIGLYCERIDES: 110 mg/dL (ref <150)
VLDL: 22 mg/dL (ref <30)

## 2016-04-11 LAB — COMPLETE METABOLIC PANEL WITH GFR
ALBUMIN: 4.2 g/dL (ref 3.6–5.1)
ALT: 8 U/L (ref 6–29)
AST: 12 U/L (ref 10–30)
Alkaline Phosphatase: 72 U/L (ref 33–115)
BUN: 11 mg/dL (ref 7–25)
CALCIUM: 9.2 mg/dL (ref 8.6–10.2)
CHLORIDE: 103 mmol/L (ref 98–110)
CO2: 26 mmol/L (ref 20–31)
CREATININE: 0.9 mg/dL (ref 0.50–1.10)
GFR, Est African American: >89 mL/min (ref >=60)
GFR, Est Non African American: 83 mL/min (ref >=60)
Glucose, Bld: 105 mg/dL — ABNORMAL HIGH (ref 65–99)
Potassium: 3.9 mmol/L (ref 3.5–5.3)
Sodium: 138 mmol/L (ref 135–146)
Total Bilirubin: 0.2 mg/dL (ref 0.2–1.2)
Total Protein: 7 g/dL (ref 6.1–8.1)

## 2016-04-11 LAB — TSH: TSH: 20.81 mIU/L — ABNORMAL HIGH

## 2016-04-11 LAB — VITAMIN D 25 HYDROXY (VIT D DEFICIENCY, FRACTURES): Vit D, 25-Hydroxy: 30 ng/mL (ref 30–100)

## 2016-04-11 LAB — HEMOGLOBIN A1C
Hgb A1c MFr Bld: 5.6 % (ref <5.7)
MEAN PLASMA GLUCOSE: 114 mg/dL

## 2016-04-17 ENCOUNTER — Encounter: Payer: Self-pay | Admitting: Family Medicine

## 2016-04-17 ENCOUNTER — Ambulatory Visit (INDEPENDENT_AMBULATORY_CARE_PROVIDER_SITE_OTHER): Payer: Medicaid Other | Admitting: Family Medicine

## 2016-04-17 VITALS — BP 140/48 | HR 66 | Temp 98.4°F | Resp 16 | Ht 66.0 in | Wt 184.0 lb

## 2016-04-17 DIAGNOSIS — G43101 Migraine with aura, not intractable, with status migrainosus: Secondary | ICD-10-CM

## 2016-04-17 DIAGNOSIS — G4701 Insomnia due to medical condition: Secondary | ICD-10-CM

## 2016-04-17 DIAGNOSIS — F331 Major depressive disorder, recurrent, moderate: Secondary | ICD-10-CM | POA: Diagnosis not present

## 2016-04-17 DIAGNOSIS — R609 Edema, unspecified: Secondary | ICD-10-CM | POA: Diagnosis not present

## 2016-04-17 DIAGNOSIS — G4726 Circadian rhythm sleep disorder, shift work type: Secondary | ICD-10-CM | POA: Diagnosis not present

## 2016-04-17 DIAGNOSIS — E034 Atrophy of thyroid (acquired): Secondary | ICD-10-CM | POA: Diagnosis not present

## 2016-04-17 MED ORDER — LEVOTHYROXINE SODIUM 150 MCG PO TABS: 150.0000 ug | ORAL_TABLET | Freq: Every day | ORAL | 11 refills | Status: DC

## 2016-04-17 MED ORDER — TRAZODONE HCL 100 MG PO TABS: 100.0000 mg | ORAL_TABLET | Freq: Every day | ORAL | 5 refills | Status: DC

## 2016-04-17 MED ORDER — ZOLPIDEM TARTRATE 5 MG PO TABS: 5.0000 mg | ORAL_TABLET | Freq: Every evening | ORAL | 1 refills | Status: DC | PRN

## 2016-04-17 MED ORDER — FUROSEMIDE 20 MG PO TABS: 20.0000 mg | ORAL_TABLET | Freq: Every day | ORAL | 3 refills | Status: DC | PRN

## 2016-04-17 MED ORDER — POTASSIUM CHLORIDE ER 10 MEQ PO TBCR: 10.0000 meq | EXTENDED_RELEASE_TABLET | Freq: Every day | ORAL | 3 refills | Status: DC | PRN

## 2016-04-17 NOTE — Assessment & Plan Note (Addendum)
Suspected primary etiology with poor sleep insomnia. Now worse on 3rd shift schedule work at 11pm to 7am, unable to sleep during day. - See A&P, continue Sertraline, increased Trazodone to , add Ambien  PRN for now to reset sleep cycle - Additionally advised needs if at all possible to try to switch to 1st shift schedule, will likely need counseling / therapy to help as well

## 2016-04-17 NOTE — Assessment & Plan Note (Signed)
Concern with still symptomatic (could be depression/mood/insomia) now fluctuating TSH, went from 13 > 1 > 20, despite minimal change in therapy, reportedly adhering to Levothyroxine daily. Lab unable to add on Free T4 to existing lab sample - TSH trend 29 > 13 > 1 > 20  Plan: 1. Increase Levothyroxine from to daily - had been on this dose from previous provider 2. Referral today to [facility] Endocrinology for further evaluation given difficult to manage fluctuating TSH, still symptomatic but difficult to appreciate etiology of symptoms now with complicated depression/insomnia shift worker sleep disorder 3. Follow-up as needed for now

## 2016-04-17 NOTE — Progress Notes (Signed)
Subjective:    Patient ID: Kari Gallagher, female    DOB: 1980/04/04, 36 y.o.   MRN: [id number]  Kari Gallagher is a 36 y.o. female presenting on 04/17/2016 for Migraine (traZodone not helping for sleep)   HPI   DEPRESSION / INSOMNIA / SHIFT WORK SLEEP DISORDER: - Last visit with me for this problem on 01/13/16, see note for background, briefly she had been increased from Sertraline  to  daily, also advised to continue Trazodone  nightly for sleep, and given detailed sleep hygiene instructions to improve sleep, severely limited by shift work sleep disorder and her lifestyle. - Today she reports sleep is still very poor. She has switched her job from 2nd shift to now 3rd shift, which seems worse for her, no 1st shift job available. She goes in to work at [employer] and home at [place], her oldest son helps other kids get ready for school, she tries to lay down to sleep at home alone around 10am but difficulty sleeping, often only gets maybe 1-2 hours of sleep and then has to go to work, she had been more improved on 2nd shift schedule. - She stopped taking Trazodone after running out, requesting new refill - Denies worsening mood or sadness, depression, anxiety, appetite, energy - Denies any suicidal or homicidal ideation at this time  Follow-up Headaches / Suspected Migraines, new onset - Last visit with me 03/26/16 for new problem migraine headaches, see note for background information also recent ED visit 03/08/16 for migraine HA as well. She was given Sumatriptan nasal spray by me at last visit - Today she reports overall improved migraines over past 3 weeks. Still occasionally taking Ibuprofen PRN headache. She did have only one significant early migraine HA since last visit, took Sumatriptan nasal spray x 1 dose with resolution of HA and without side effects. Did not need 2nd dose, has not used again since. - Denies any active HA or associated symptoms  FOLLOW-UP HYPOTYROIDISM: -  Chronic problem for years with prior history Graves Disease s/p radioactive iodine therapy, and on Levothyroxine supplement since. Followed by me since 10/2015 with variable TSH readings, had been off levo initially TSH ranging from 29 down to 13, recent reading was lowest around 1, and then most recent TSH now back up to 20, despite her reported adherence to Levothyroxine daily - Today reports concern with recent abnormal TSH. Still thinks her symptoms are related to this, has worsening insomnia (see above), thinning hair, weight gain, fatigue, edema. In the past had taken Levothyroxine daily, now requesting new refill at increased dose as we discussed - Has not seen Endocrinology for this problem  Bilateral Lower Extremity Edema - Last visit with me 03/26/16 reviewed this problem, see note for background information, additionally with recent chest symptoms including ED visit for CP, she was referred to Cardiology, had seen Dr Mariah Milling, and given improvement on PRN Lasix for edema she was advised to continue this and given K supplement to take with it. - Now today needs new rx Lasix and K, attempted to pick up from pharmacy but stated it was not ready, not going to take daily, only as needed for swelling - Denies leg pain, asymmetry, redness  Social History  Substance Use Topics  . Smoking status: Former Smoker    Packs/day: 1.00    Types: Cigarettes  . Smokeless tobacco: Never Used  . Alcohol use No    Review of Systems Per HPI unless specifically indicated above  Objective:    BP (!) 140/48   Pulse 66   Temp 98.4 F (36.9 C) (Oral)   Resp 16   Ht  (1.676 m)   Wt 184 lb (83.5 kg)   BMI 29.70 kg/m   Wt Readings from Last 3 Encounters:  04/17/16 184 lb (83.5 kg)  04/02/16 181 lb 8 oz (82.3 kg)  03/26/16 186 lb (84.4 kg)    Physical Exam  Constitutional: She is oriented to person, place, and time. She appears well-developed and well-nourished. No distress.    Still tired-appearing but somewhat improved, appears well, comfortable, cooperative   HENT:  Head: Normocephalic and atraumatic.  Mouth/Throat: Oropharynx is clear and moist.  Thinning hair  Neck: Normal range of motion. Neck supple. No thyromegaly present.  Cardiovascular: Normal rate, regular rhythm, normal heart sounds and intact distal pulses.   No murmur heard. Pulmonary/Chest: Effort normal and breath sounds normal. No respiratory distress. She has no wheezes. She has no rales.  Musculoskeletal: Normal range of motion. She exhibits no edema (Improved).  Lymphadenopathy:    She has no cervical adenopathy.  Neurological: She is alert and oriented to person, place, and time. Coordination normal.  Skin: Skin is warm and dry. No rash noted. She is not diaphoretic. No erythema.  Psychiatric: Her behavior is normal.  Well groomed. Stable positive good mood and congruent affect today. Not appearing anxious. No crying spells or tearful. Normal speech and thoughts. Stable improved alertness and less tired.  Nursing note and vitals reviewed.  I have personally reviewed the below lab results from 08/22/15  Results for orders placed or performed in visit on 04/03/16  COMPLETE METABOLIC PANEL WITH GFR  Result Value Ref Range   Sodium 138 135 - 146 mmol/L   Potassium 3.9 3.5 - 5.3 mmol/L   Chloride 103 98 - 110 mmol/L   CO2 26 20 - 31 mmol/L   Glucose, Bld 105 (H) 65 - 99 mg/dL   BUN 11 7 - 25 mg/dL   Creat 8.11 9.14 - 7.82 mg/dL   Total Bilirubin 0.2 0.2 - 1.2 mg/dL   Alkaline Phosphatase 72 33 - 115 U/L   AST 12 10 - 30 U/L   ALT 8 6 - 29 U/L   Total Protein 7.0 6.1 - 8.1 g/dL   Albumin 4.2 3.6 - 5.1 g/dL   Calcium 9.2 8.6 - 95.6 mg/dL   GFR, Est African American >89 >=60 mL/min   GFR, Est Non African American 83 >=60 mL/min  Hemoglobin A1c  Result Value Ref Range   Hgb A1c MFr Bld 5.6 <5.7 %   Mean Plasma Glucose 114 mg/dL  Lipid panel  Result Value Ref Range   Cholesterol  213 (H) <200 mg/dL   Triglycerides 213 <086 mg/dL   HDL 69 >57 mg/dL   Total CHOL/HDL Ratio 3.1 <5.0 Ratio   VLDL 22 <30 mg/dL   LDL Cholesterol 846 (H) <100 mg/dL  VITAMIN D 25 Hydroxy (Vit-D Deficiency, Fractures)  Result Value Ref Range   Vit D, 25-Hydroxy 30 30 - 100 ng/mL  TSH  Result Value Ref Range   TSH 20.81 (H) mIU/L      Assessment & Plan:   Problem List Items Addressed This Visit    Shift work sleep disorder    Suspected primary etiology with poor sleep insomnia. Now worse on 3rd shift schedule work at 11pm to 7am, unable to sleep during day. - See A&P, continue Sertraline, increased Trazodone to , add Ambien   PRN for now to reset sleep cycle - Additionally advised needs if at all possible to try to switch to 1st shift schedule, will likely need counseling / therapy to help as well      Relevant Medications   zolpidem (AMBIEN) 5 MG tablet   Peripheral edema    Stable on PRN lasix Refilled Lasix and K supplement take together only PRN      Relevant Medications   furosemide (LASIX) 20 MG tablet   potassium chloride (K-DUR) 10 MEQ tablet   Migraine with aura and with status migrainosus, not intractable    Significantly improved, controlled now. Only x 1 migraine in 3 weeks, resolved with sumatriptan nasal x 1 dose  Plan: 1. Encouraged to continue with headache diary, keep log 2. Use ibuprofen PRN minor headaches, use Sumatriptan nasal as instructed early if moderate to severe headache / migraine 3. Follow-up as needed - future consider prophylaxis      Relevant Medications   furosemide (LASIX) 20 MG tablet   traZODone (DESYREL) 100 MG tablet   Insomnia - Primary    Worse now with shift change 2nd to 3rd shift, very poor hours and does not seem to be able to rest during day. Also complicated by depression, hypothyroidism - Likely has large amount of sleep debt currently, will take a while to repay  Plan: 1. Increase Trazodone  to  at bedtime  - 1st week half tab  then increase to , new rx sent 2. Continue Zoloft  3. Continue Sleep Hygiene - emphasized importance of set bed time 4. Discussion on resetting sleep cycle as well - given rx Ambien  PRN #10 tabs +1 refill for now only temporary plan to improve sleep in short-term 5. Follow-up 1-3 months as needed      Relevant Medications   traZODone (DESYREL) 100 MG tablet   zolpidem (AMBIEN) 5 MG tablet   Hypothyroidism    Concern with still symptomatic (could be depression/mood/insomia) now fluctuating TSH, went from 13 > 1 > 20, despite minimal change in therapy, reportedly adhering to Levothyroxine daily. Lab unable to add on Free T4 to existing lab sample - TSH trend 29 > 13 > 1 > 20  Plan: 1. Increase Levothyroxine from to daily - had been on this dose from previous provider 2. Referral today to [facility] Endocrinology for further evaluation given difficult to manage fluctuating TSH, still symptomatic but difficult to appreciate etiology of symptoms now with complicated depression/insomnia shift worker sleep disorder 3. Follow-up as needed for now      Relevant Medications   levothyroxine (SYNTHROID, LEVOTHROID) 150 MCG tablet   Other Relevant Orders   Ambulatory referral to Endocrinology   Depression    Stable to mild improvement in MDD on Sertraline now , difficult to determine all symptoms due to significant insomnia with shiftwork sleep disorder. Also hypothyroidism which thought was corrected but now TSH up to 20, awaiting Endocrine input.  Plan: 1. Increase Trazodone from 50 to  tabs - titrate back up with  (half tab) for 1 week then up to  daily at bedtime - for depression and sleep 2. Continue Zoloft  daily 3. Again advised to follow-up with therapist in future 4. Emphasized importance of improved sleep in order to help her mood and function, see A&P - given rx Ambien  PRN only to reset sleep cycle and  improve function for now 5. Follow-up 1-3 months - PHQ      Relevant  Medications   traZODone (DESYREL) 100 MG tablet      Meds ordered this encounter  Medications  . levothyroxine (SYNTHROID, LEVOTHROID) 150 MCG tablet    Sig: Take 1 tablet (150 mcg total) by mouth daily before breakfast.    Dispense:  30 tablet    Refill:  11  . furosemide (LASIX) 20 MG tablet    Sig: Take 1 tablet (20 mg total) by mouth daily as needed for edema. Take Potassium supplement with this pill.    Dispense:  30 tablet    Refill:  3  . potassium chloride (K-DUR) 10 MEQ tablet    Sig: Take 1 tablet (10 mEq total) by mouth daily as needed. When taking Lasix fluid pill.    Dispense:  30 tablet    Refill:  3  . traZODone (DESYREL) 100 MG tablet    Sig: Take 1 tablet (100 mg total) by mouth at bedtime. Start with half tablet ( ) for up to 1 week, then increase to 1 whole tablet    Dispense:  30 tablet    Refill:  5  . zolpidem (AMBIEN) 5 MG tablet    Sig: Take 1 tablet (5 mg total) by mouth at bedtime as needed for sleep.    Dispense:  10 tablet    Refill:  1      Follow up plan: Return in about 4 weeks (around 05/15/2016) for 1 to 3 months for Insomnia follow-up.  [name], DO [facility] 04/17/2016, 11:43 PM

## 2016-04-17 NOTE — Patient Instructions (Signed)
Thank you for coming in to clinic today.  1. For Insomnia - Sent new rx Trazodone  tablets (higher dose now), try to lay down to go to sleep earlier at around 9 to 10am, for first week try half tablet  daily, and then increase up to  (1 whole tablet), take EVERY day  - New rx Ambien  - ONLY take as needed - goal to reset sleep cycle, caution with sedation and drowsy after wake up, caution with driving, take around 9 to 10 am after several days on the trazodone if not helping, ONLY AS NEEDED not every day, cannot continue this long-term  2. Thyroid - Increase dose Levothyroxine to daily, sent new rx - Referral to Thyroid Endocrine Specialist  [facility] [address] [place], [place]  [address] Phone: [phone]  A. Wendall Mola, MD ([place])  3. Refilled Fluid pill and Potassium can pick up from pharmacy, take as needed  Please schedule a follow-up appointment with Dr. Althea Charon in 1 to 3 months for Insomnia follow-up  If you have any other questions or concerns, please feel free to call the clinic or send a message through MyChart. You may also schedule an earlier appointment if necessary.  [name], DO [facility], [place]

## 2016-04-17 NOTE — Assessment & Plan Note (Signed)
Significantly improved, controlled now. Only x 1 migraine in 3 weeks, resolved with sumatriptan nasal x 1 dose  Plan: 1. Encouraged to continue with headache diary, keep log 2. Use ibuprofen PRN minor headaches, use Sumatriptan nasal as instructed early if moderate to severe headache / migraine 3. Follow-up as needed - future consider prophylaxis

## 2016-04-17 NOTE — Assessment & Plan Note (Signed)
Stable to mild improvement in MDD on Sertraline now , difficult to determine all symptoms due to significant insomnia with shiftwork sleep disorder. Also hypothyroidism which thought was corrected but now TSH up to 20, awaiting Endocrine input.  Plan: 1. Increase Trazodone from 50 to  tabs - titrate back up with  (half tab) for 1 week then up to  daily at bedtime - for depression and sleep 2. Continue Zoloft  daily 3. Again advised to follow-up with therapist in future 4. Emphasized importance of improved sleep in order to help her mood and function, see A&P - given rx Ambien  PRN only to reset sleep cycle and improve function for now 5. Follow-up 1-3 months - PHQ

## 2016-04-17 NOTE — Assessment & Plan Note (Signed)
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 Also complicated by depression, hypothyroidism - Likely has large amount of sleep debt currently, will take a while to repay  Plan: 1. Increase Trazodone  to  at bedtime - 1st week half tab  then increase to , new rx sent 2. Continue Zoloft  3. Continue Sleep Hygiene - emphasized importance of set bed time 4. Discussion on resetting sleep cycle as well - given rx Ambien  PRN #10 tabs +1 refill for now only temporary plan to improve sleep in short-term 5. Follow-up 1-3 months as needed

## 2016-04-17 NOTE — Assessment & Plan Note (Signed)
Stable on PRN lasix Refilled Lasix and K supplement take together only PRN

## 2016-05-02 ENCOUNTER — Encounter: Payer: Self-pay | Admitting: Family Medicine

## 2016-05-02 ENCOUNTER — Ambulatory Visit: Payer: Medicaid Other | Admitting: Family Medicine

## 2016-05-02 ENCOUNTER — Ambulatory Visit (INDEPENDENT_AMBULATORY_CARE_PROVIDER_SITE_OTHER): Payer: Medicaid Other | Admitting: Family Medicine

## 2016-05-02 VITALS — BP 129/80 | HR 67 | Temp 98.6°F | Resp 16 | Ht 66.0 in | Wt 184.0 lb

## 2016-05-02 DIAGNOSIS — G4701 Insomnia due to medical condition: Secondary | ICD-10-CM

## 2016-05-02 DIAGNOSIS — R42 Dizziness and giddiness: Secondary | ICD-10-CM

## 2016-05-02 DIAGNOSIS — E034 Atrophy of thyroid (acquired): Secondary | ICD-10-CM | POA: Diagnosis not present

## 2016-05-02 NOTE — Patient Instructions (Signed)
Thank you for coming to the clinic today.  For your dizzy spells could be several causes - May be poor sleep and insomnia still - Also can be related to difficult to control thyroid hormone - Call Endocrine office to schedule your appointment they should still reach out to you soon hopefully - No change to medications  Start checking BP more regularly, if BP is running higher >140/90 then may have diagnosis of Hypertension and we may need to start medicine for this  Possibly could be a sign of vertigo, however room is not spinning - This is commonly caused by inner ear fluid imbalance, sometimes can be worsened by allergies and sinus symptoms, otherwise it can occur randomly sometimes and we may never discover the exact cause. - To treat this, try the Epley Manuever (see diagrams/instructions below) at home up to 3 times a day for 1-2 weeks or until symptoms resolve - You may take Meclizine as needed up to 3 times a day for dizziness, this will not cure symptoms but may help. Caution may make you drowsy.  If you develop significant worsening episode with vertigo that does not improve and you get severe headache, loss of vision, arm or leg weakness, slurred speech, or other concerning symptoms please seek immediate medical attention at Emergency Department.  Please schedule a follow-up appointment with Dr Althea Charon within 4 weeks if Dizziness not improving, and will consider ENT vs Vestibular rehab  See the next page for images describing the Epley Manuever.     ----------------------------------------------------------------------------------------------------------------------       If you have any other questions or concerns, please feel free to call the clinic or send a message through MyChart. You may also schedule an earlier appointment if necessary.  [name], DO [facility], [place]

## 2016-05-02 NOTE — Assessment & Plan Note (Signed)
Likely contributing to recent dizzy spell episodes with very poor sleep. Mild improvement on Trazodone, but not significant. Some improved sleep on Ambien but only taking rarely. Only getting 2-4 hours of sleep daily prior to work 3rd shift. - No changes to current med regimen today - Continue improve sleep hygiene, may take Ambien if needed, caution on drowsy, groggy, this may trigger a dizzy spell potentially - Follow-up as planned

## 2016-05-02 NOTE — Progress Notes (Signed)
Subjective:    Patient ID: Kari Gallagher, female    DOB: [birth year], 36 y.o.   MRN: [id number]  Kari Gallagher is a 36 y.o. female presenting on 05/02/2016 for Dizziness (work for  3rd shift had little high B/P 139/90)  Patient presents for a same day appointment.  HPI   Dizziness Episodes, Acute: - Reports new complaint with acute episodes of brief dizziness and lightheadedness last night while at work (night 3rd shift), describes first episode at 1230am while in bathroom, brief episode felt lightheadedness and dizzy without room spinning, she did have a small amount of diarrhea and unsure if this was related, next episode about 15 minutes, similar episode and again later in shift. Sent to nurse station, checked BP 139/90, was concerned about elevated BP. She does not have known diagnosis of HTN. - Earlier in the day yesterday prior to work she had some headache symptoms, but not consistent with migraine and it was self limited. - See below about Insomnia, but she still has poor sleep, did improve yesterday up to 4 hours of sleep before work, did not take Ambien. - Recent appetite was good, trying to stay hydrated but not always drinking enough water - For swelling, infrequent dosing Lasix, last dose Saturday only with swelling. Did not take yesterday - Additionally admits she ate shrimp over weekend and had mild tongue swelling which resolved, her mother is allergic to shrimp - Admits R leg was shaking briefly during first episode - Denies any recent fevers/chills, sweats, chest pain or pressure, dyspnea, loss of vision, headache with episodes, nausea, vomiting, numbness, tingling, weakness, slurred speech, loss of consciousness or syncope  INSOMNIA / SHIFT WORK SLEEP DISORDER: Last visit with me 04/17/16, increased Trazodone from 50 to  nightly and added Ambien  PRN - Today reports she has been taking Trazodone with mild relief, and only tried Ambien 1-2 times with some good  results, but does not take it regularly. She did not take it yesterday prior to her work shift with the above mentioned dizzy spells. - Still very poor sleep, yesterday only 4 hours of sleep prior to work, and she did feel tired but some improved with rest, 4 hours was most she had slept in a while. - Denies worsening mood or sadness, depression, anxiety, appetite, energy  HYPOTHYROIDISM: - Last visit 04/17/16, increased her Levothyroxine from 125 to daily due to fluctuating TSH that now increased back to 20. She was referred to [facility], and has not heard back yet about appointment. She has not called them yet.  Social History  Substance Use Topics  . Smoking status: Former Smoker    Packs/day: 1.00    Types: Cigarettes  . Smokeless tobacco: Former Neurosurgeon  . Alcohol use No    Review of Systems Per HPI unless specifically indicated above     Objective:    BP 129/80   Pulse 67   Temp 98.6 F (37 C) (Oral)   Resp 16   Ht  (1.676 m)   Wt 184 lb (83.5 kg)   BMI 29.70 kg/m   Wt Readings from Last 3 Encounters:  05/02/16 184 lb (83.5 kg)  04/17/16 184 lb (83.5 kg)  04/02/16 181 lb 8 oz (82.3 kg)    Physical Exam  Constitutional: She is oriented to person, place, and time. She appears well-developed and well-nourished. No distress.  Continues to be tired-appearing, comfortable, cooperative  HENT:  Head: Normocephalic and atraumatic.  Mouth/Throat: Oropharynx  is clear and moist.  Frontal / maxillary sinuses non-tender. Nares patent without purulence or edema. Bilateral TMs clear without erythema, effusion or bulging. Oropharynx clear without erythema, exudates, edema or asymmetry.  Dix-Hallpike maneuver performed negative for nystagmus and vertigo symptoms. However upon sitting up from R side did have some dizziness  Eyes: Conjunctivae and EOM are normal. Pupils are equal, round, and reactive to light. Right eye exhibits no discharge. Left eye exhibits no  discharge.  Neck: Normal range of motion. Neck supple.  Cardiovascular: Normal rate, regular rhythm, normal heart sounds and intact distal pulses.   No murmur heard. Pulmonary/Chest: Effort normal and breath sounds normal. No respiratory distress. She has no wheezes. She has no rales.  Musculoskeletal: Normal range of motion. She exhibits no edema.  Neurological: She is alert and oriented to person, place, and time. No cranial nerve deficit.  Distal sensation to light touch intact  Skin: Skin is warm and dry. No rash noted. She is not diaphoretic. No erythema.  Psychiatric: Her behavior is normal.  Nursing note and vitals reviewed.  Results for orders placed or performed in visit on 04/03/16  COMPLETE METABOLIC PANEL WITH GFR  Result Value Ref Range   Sodium 138 135 - 146 mmol/L   Potassium 3.9 3.5 - 5.3 mmol/L   Chloride 103 98 - 110 mmol/L   CO2 26 20 - 31 mmol/L   Glucose, Bld 105 (H) 65 - 99 mg/dL   BUN 11 7 - 25 mg/dL   Creat 1.61 0.96 - 0.45 mg/dL   Total Bilirubin 0.2 0.2 - 1.2 mg/dL   Alkaline Phosphatase 72 33 - 115 U/L   AST 12 10 - 30 U/L   ALT 8 6 - 29 U/L   Total Protein 7.0 6.1 - 8.1 g/dL   Albumin 4.2 3.6 - 5.1 g/dL   Calcium 9.2 8.6 - 40.9 mg/dL   GFR, Est African American >89 >=60 mL/min   GFR, Est Non African American 83 >=60 mL/min  Hemoglobin A1c  Result Value Ref Range   Hgb A1c MFr Bld 5.6 <5.7 %   Mean Plasma Glucose 114 mg/dL  Lipid panel  Result Value Ref Range   Cholesterol 213 (H) <200 mg/dL   Triglycerides 811 <914 mg/dL   HDL 69 >78 mg/dL   Total CHOL/HDL Ratio 3.1 <5.0 Ratio   VLDL 22 <30 mg/dL   LDL Cholesterol 295 (H) <100 mg/dL  VITAMIN D 25 Hydroxy (Vit-D Deficiency, Fractures)  Result Value Ref Range   Vit D, 25-Hydroxy 30 30 - 100 ng/mL  TSH  Result Value Ref Range   TSH 20.81 (H) mIU/L      Assessment & Plan:   Problem List Items Addressed This Visit    Insomnia    Likely contributing to recent dizzy spell episodes with very  poor sleep. Mild improvement on Trazodone, but not significant. Some improved sleep on Ambien but only taking rarely. Only getting 2-4 hours of sleep daily prior to work 3rd shift. - No changes to current med regimen today - Continue improve sleep hygiene, may take Ambien if needed, caution on drowsy, groggy, this may trigger a dizzy spell potentially - Follow-up as planned      Hypothyroidism    Has not established with [facility] Endocrinology yet, referral placed last visit 4/17 Continues on Levothyroxine daily, previously increased dose, possibly poorly controlled hypothyroidism is contributing to her symptoms with dizzy spells Advised patient to call Endocrinology to check status on referral, notify our  office if needed In future needs additional thyroid studies along with repeat TSH including Free T4, will defer to Endocrine for now       Other Visit Diagnoses    Dizzy spells    -  Primary  Unclear exact etiology, seems to have been self limited, now resolved. Possibly related to several factors with very poor sleep insomnia and shift work sleep disorder, possibly poor hydration also had some diarrhea, did not take lasix recently should not be cause of symptoms. Considered hypothyroidism is poorly controlled based on abnormal TSH, recent increase dose 2 weeks ago, should not be cause but this is possible. Lastly elevated BP at work and in office previously, concern Pre-HTN but she has had some borderline readings near 140/90, in future if remains elevated can consider new HTN diagnosis and start therapy, maybe dizziness is related to elevated BP. Possible R BPPV on exam but not clearly supported by history without reported vertigo - No focal neuro deficits today, resolved symptoms - Reassurance - No medication changes, improve hydration, sleep - Call Endocrinology, need further evaluation of thyroid - Reviewed labs done 04/2016, no need to repeat blood work today - Strict return criteria  if worsening or persistent dizziness, syncope needs to go directly to hospital ED - Note written may return to work, as patient feels comfortable returning       No orders of the defined types were placed in this encounter.   Follow up plan: Return in about 4 weeks (around 05/30/2016), or if symptoms worsen or fail to improve, for dizziness.   [name], DO [facility] 05/02/2016, 1:01 PM

## 2016-05-02 NOTE — Assessment & Plan Note (Signed)
Has not established with [facility] Endocrinology yet, referral placed last visit 4/17 Continues on Levothyroxine daily, previously increased dose, possibly poorly controlled hypothyroidism is contributing to her symptoms with dizzy spells Advised patient to call Endocrinology to check status on referral, notify our office if needed In future needs additional thyroid studies along with repeat TSH including Free T4, will defer to Endocrine for now

## 2016-05-31 ENCOUNTER — Ambulatory Visit (INDEPENDENT_AMBULATORY_CARE_PROVIDER_SITE_OTHER): Payer: Medicaid Other | Admitting: Nurse Practitioner

## 2016-05-31 ENCOUNTER — Ambulatory Visit
Admission: RE | Admit: 2016-05-31 | Discharge: 2016-05-31 | Disposition: A | Discharge status: Home / Self Care | Payer: Medicaid Other | Source: Ambulatory Visit | Attending: Nurse Practitioner | Admitting: Nurse Practitioner

## 2016-05-31 ENCOUNTER — Encounter: Payer: Self-pay | Admitting: Nurse Practitioner

## 2016-05-31 VITALS — BP 108/81 | HR 84 | Temp 98.6°F | Ht 65.0 in | Wt 185.4 lb

## 2016-05-31 DIAGNOSIS — M25562 Pain in left knee: Secondary | ICD-10-CM | POA: Diagnosis not present

## 2016-05-31 DIAGNOSIS — M25531 Pain in right wrist: Secondary | ICD-10-CM

## 2016-05-31 MED ORDER — IBUPROFEN 600 MG PO TABS: 600.0000 mg | ORAL_TABLET | Freq: Three times a day (TID) | ORAL | 0 refills | Status: DC | PRN

## 2016-05-31 MED ORDER — PREDNISONE 10 MG PO TABS: ORAL_TABLET | ORAL | 0 refills | Status: DC

## 2016-05-31 NOTE — Progress Notes (Signed)
I have reviewed this encounter including the documentation in this note and/or discussed this patient with the provider, Wilhelmina McardleLauren Kennedy, AGPCNP-BC. I am certifying that I agree with the content of this note as supervising physician.  Saralyn PilarAlexander Malikah Lakey, DO [facility] 05/31/2016, 2:00 PM

## 2016-05-31 NOTE — Patient Instructions (Addendum)
Kari Gallagher, Thank you for coming in to clinic today.  1. At first glance, your x-rays looks normal.  We will call you if the radiologist report tells [place] otherwise. 2. Start taking Tylenol extra strength 2 tablets every 8 hours for aches for next few days as needed.  Do not take more than 3,000 mg in 24 hours from all medicines.  May take Ibuprofen as well if tolerated 600 mg every 8 hours as needed. - Use ice/frozen vegetable first.  Apply this for 15 minutes at a times 6-8 times per day.  If no ice available, can use heat. - Muscle rub with lidocaine.  Avoid using this with heat and ice to avoid burns.  Use after heat/ice. - Compression wraps to your wrist and knee.  ACE bandages can also be used. - We may consider MRI after 4 weeks of no improvement. -Take prednisone taper 10 mg tablets Day 1 (Today): Take 6 pills at one time Day 2: Take 6 pills  Day 3: Take 5 pills Day 4: Take 4 pills Day 5: Take 3 pills Day 6: Take 2 pills Day 7: Take 1 pill then stop. 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 - If you need work note, let us know.  Consider short term disability and/or FMLA.  - IF you cannot feel your fingers, they get cold, or you can't make a fist or bend your wrist, go to the Emergency Room.  This is a medical emergency.  Please schedule a follow-up appointment with Wilhelmina Mcardle, AGNP to Return 1-2 weeks if symptoms worsen or fail to improve.  If you have any other questions or concerns, please feel free to call the clinic or send a message through MyChart. You may also schedule an earlier appointment if necessary.  Wilhelmina Mcardle, DNP, AGNP-BC Adult Gerontology Nurse Practitioner [facility], [facility]   Knee Sprain, Adult A knee sprain is a stretch or tear in a knee ligament. Knee ligaments are bands of tissue that connect bones in the knee to each other. What are the causes? This condition often results from:  A  fall.  An injury to the knee.  What are the signs or symptoms? Symptoms of this condition include:  Trouble bending the leg.  Swelling in the knee.  Bruising around the knee.  Tenderness or pain in the knee.  Muscle spasms around the knee.  How is this diagnosed? This condition may be diagnosed based on:  A physical exam.  What happened just before you started to have symptoms.  Tests, including: ? An X-ray. This may be done to make sure no bones are broken. ? An MRI. This may be done to check if the ligament is torn. ? Stress testing of the knee. This may be done to check ligament damage.  How is this treated? Treatment for this condition may involve:  Keeping the knee still (immobilized) with a cast, brace, or splint.  Applying ice to the knee. This helps with pain and swelling.  Keeping the knee raised (elevated) above the level of your heart when you are resting. This helps with pain and swelling.  Taking medicine for pain.  Exercises to prevent or limit permanent weakness or stiffness in your knee.  Surgery to reconnect the ligament to the bone or to reconstruct it. This may be needed if the ligament tore all the way.  Follow these instructions at home: If you have a splint or brace:  Wear the splint or brace as told by your health care provider. Remove it only as told by your health care provider.  Loosen the splint or brace if your toes tingle, become numb, or turn cold and blue.  Keep the splint or brace clean.  If the splint or brace is not waterproof: ? Do not let it get wet. ? Cover it with a watertight covering when you take a bath or a shower. If you have a cast:  Do not stick anything inside the cast to scratch your skin. Doing that increases your risk of infection.  Check the skin around the cast every day. Tell your health care provider about any concerns.  You may put lotion on dry skin around the edges of the cast. Do not put lotion on  the skin underneath the cast.  Keep the cast clean.  If the cast is not waterproof: ? Do not let it get wet. ? Cover it with a watertight covering when you take a bath or a shower. Managing pain, stiffness, and swelling   If directed, put ice on the injured area. ? If you have a removable splint or brace, remove it as told by your health care provider. ? Put ice in a plastic bag. ? Place a towel between your skin and the bag or between your cast and the bag. ? Leave the ice on for 20 minutes, 2-3 times a day.  Gently move your toes often to avoid stiffness and to lessen swelling.  Elevate the injured area above the level of your heart while you are sitting or lying down.  Take over-the-counter and prescription medicines only as told by your health care provider. General instructions  Do exercises as told by your health care provider.  Keep all follow-up visits as told by your health care provider. This is important. Contact a health care provider if:  You have pain that gets worse.  The cast, brace, or splint does not fit right.  The cast, brace, or splint gets damaged. Get help right away if:  You cannot use your injured joint to support any of your body weight (cannot bear weight).  You cannot move the injured joint.  You cannot walk more than a few steps without pain or without your knee buckling.  You have significant pain, swelling, or numbness below the cast, brace, or splint. This information is not intended to replace advice given to you by your health care provider. Make sure you discuss any questions you have with your health care provider. Document Released: 12/18/2004 Document Revised: 09/07/2015 Document Reviewed: 07/08/2015 [employer] Interactive Patient Education  2017 [employer]

## 2016-05-31 NOTE — Progress Notes (Signed)
Subjective:    Patient ID: Kari Gallagher, female    DOB: 08/19/1980, 36 y.o.   MRN: [id number]  Kari Gallagher is a 36 y.o. female presenting on 05/31/2016 for Injury (pt complains that she tripped over a rug in a store and fell face forward x 2 days ago. She's complainig today of left leg pain right forearm pain w/ swelling, right hand swollen w/ pain and tingling. Also left hip pain. )   HPI  Fall with injury Pt fell Tuesday night after tripping over a rug in the store ([employer] blower/designer offered ice and medicine and helped pt to her car).  She fell forward and landed on her left knee and right arm.  Over last 2 days, swelling and pain of joints have not improved.  LEFT KNEE PAIN: Pt notices she is hopping r/t pain when she puts pressure on leg. Calf muscle and anterior distal thigh are also painful.  Knee has been swollen with no improvement.  She is alternating putting ice and heat on the knee.  RIGHT HAND AND FOREARM PAIN:  Tried to catch herself w/ R arm.  Pt notes swelling of entire ulnar side of forearm and heel/palm/fingers of hand.  She notes greater tingling in 3-5 digits with some tingling in 2nd digit. Tingling is intermittent.      Has taken ibuprofen 600 mg once daily. Ice applied several times daily.   Takes Trazodone for sleep daily. Still not getting proper sleep and wishes to discuss again in future with Dr. Kirtland Bouchard.  Is helping some with sleeping with pain.  Pain control important to pt r/t going to [place] for son's college orientation this weekend.  Social History  Substance Use Topics  . Smoking status: Former Smoker    Packs/day: 1.00    Types: Cigarettes  . Smokeless tobacco: Former Neurosurgeon  . Alcohol use No    Review of Systems Per HPI unless specifically indicated above     Objective:    BP 108/81   Pulse 84   Temp 98.6 F (37 C) (Oral)   Ht 5\' 5"  (1.651 m)   Wt 185 lb 6.4 oz (84.1 kg)   BMI 30.85 kg/m   Wt Readings from Last 3 Encounters:  05/31/16 185  lb 6.4 oz (84.1 kg)  05/02/16 184 lb (83.5 kg)  04/17/16 184 lb (83.5 kg)    Physical Exam  Constitutional: She is oriented to person, place, and time. She appears well-developed and well-nourished.  HENT:  Head: Normocephalic and atraumatic.  Musculoskeletal:       Right elbow: Normal.      Right wrist: She exhibits decreased range of motion, tenderness, bony tenderness and swelling.       Left hip: Normal.       Left knee: She exhibits swelling. Tenderness found.       Left ankle: Normal.       Right forearm: She exhibits tenderness, bony tenderness and swelling.       Right hand: She exhibits decreased range of motion, tenderness, disruption of two-point discrimination and swelling. Decreased sensation noted. Decreased sensation is present in the ulnar distribution.  RIGHT HAND AND WRIST - Inspection: swelling noted - Palpation: tender heel of hand to palpation and right wrist - ulnar side.  Distal ulnar bony tenderness.  Tenderness of forearm with edema to elbow.  2-5 Fingers cool to touch, normal cap refill. - ROM: Decreased internal and external rotation, wrist flexion and extension r/t pain.  PIP, DIP, MCP joints with full ROM.   - Special Testing: none - Strength: limited by pain - Neurovascular: sensation intact, tingling present> 3-5 digits. 2-point discrimination:reduced R   Motor function normal  LEFT KNEE - Inspection: edema of joint with edema proximal and distal to joint without effusion.  Nonerythematous. - Palpation: tender to palpation without bony tenderness.  MCL tenderness exhibited - ROM: full flexion and extension w/ PROM.  AROM limited by pain. - Special Testing: negative anterior and posterior drawer test.  Varus stress test w/ minimal laxity and increased pain.  Valgus stress w/o laxity or worsening pain. - Strength: limited by pain - Neurovascular: normal sensation and motor function.   Neurological: She is oriented to person, place, and time. She has normal  reflexes.  Skin: Skin is warm and dry.  Left knee with healing abrasion under patella  Psychiatric: She has a normal mood and affect. Her behavior is normal. Judgment and thought content normal.  Vitals reviewed.  Personal review of Right wrist images and Left knee images taken today indicate no obvious bony abnormalities.  Await final review of images by Radiology.  Pt informed of these preliminary results.    Assessment & Plan:   Problem List Items Addressed This Visit    None    Visit Diagnoses    Acute pain of right wrist    -  Primary Pt with soft tissue injury, likely sprain, after fall 2 days ago.  Pt with minimal pain relief taking ibuprofen 600 mg once daily and intermittent ice application.  Tingling and swelling associated with edema. Fingers cool to touch with normal cap refill.  Plan: 1. Important to control swelling and reduce risk of compartment syndrome.  Reviewed warning signs with pt and instructed to seek care at Emergency Room. - Treat with NSAIDs (acetaminophen and ibuprofen).  Discussed alternate dosing and max dosing. - Apply heat and/or ice to affected area. - Apply compression wrap to arm to assist with swelling, pain with movement. Applied wrap in clinic. - May also apply a muscle rub with lidocaine after heat or ice. - Start prednisone taper over 7 days Day 1-2: 60 mg, Day 3: 50 mg, Day 4: 40 mg; Day 5: 30 mg; Day 6 20 mg; Day 7: 10 mg then stop - Pt concern for return to work: PT referral placed to [facility] sports medicine/rehab - X-Ray of Right wrist   Relevant Medications   predniSONE (DELTASONE) 10 MG tablet   ibuprofen (ADVIL,MOTRIN) 600 MG tablet   Other Relevant Orders   DG Wrist Complete Right   Ambulatory referral to Physical Therapy   Acute pain of left knee     Pt with soft tissue injury, likely sprained, after fall 2 days ago.  Pt with minimal pain relief taking ibuprofen 600 mg once daily and intermittent ice application. Pain with walking and  putting pressure on left leg.  No obvious disruption of ligaments requiring immediate referral to orthopedics.  Plan: 1. Same as above. 2. X-Ray of Left Knee   Relevant Medications   predniSONE (DELTASONE) 10 MG tablet   ibuprofen (ADVIL,MOTRIN) 600 MG tablet   Other Relevant Orders   DG Knee Complete 4 Views Left   Ambulatory referral to Physical Therapy      Meds ordered this encounter  Medications  . predniSONE (DELTASONE) 10 MG tablet    Sig: Day 1-2 take 6 pills. Day 3 take 5 pills then reduce by 1 pill each day.  Dispense:  27 tablet    Refill:  0    Order Specific Question:   Supervising Provider    Answer:   Smitty CordsKARAMALEGOS, ALEXANDER J [[id number]]  . ibuprofen (ADVIL,MOTRIN) 600 MG tablet    Sig: Take 1 tablet (600 mg total) by mouth every 8 (eight) hours as needed.    Dispense:  30 tablet    Refill:  0    Order Specific Question:   Supervising Provider    Answer:   Smitty CordsKARAMALEGOS, ALEXANDER J [[id number]]      Follow up plan: Return 1-2 weeks if symptoms worsen or fail to improve.  Wilhelmina McardleLauren Kattie Santoyo, DNP, AGPCNP-BC Adult Gerontology Primary Care Nurse Practitioner [facility] 05/31/2016, 11:10 AM

## 2016-06-19 ENCOUNTER — Ambulatory Visit: Payer: Medicaid Other | Attending: Nurse Practitioner | Admitting: Physical Therapy

## 2016-07-02 IMAGING — US US EXTREM  UP VENOUS*R*
1 series · 13 of 24 positions shown · non-contrast
Comparison: None.

CLINICAL DATA: Right arm swelling for 1-2 weeks. Right shoulder
pain.



[Series 1: us extrem up venous*right* · 0.04mm/px · 13 of 53 slices shown]
[im 1/53]
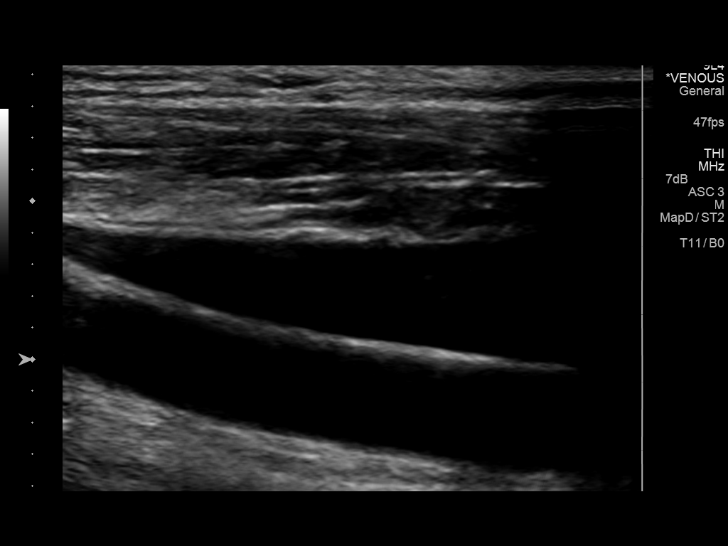
[im 5/53]
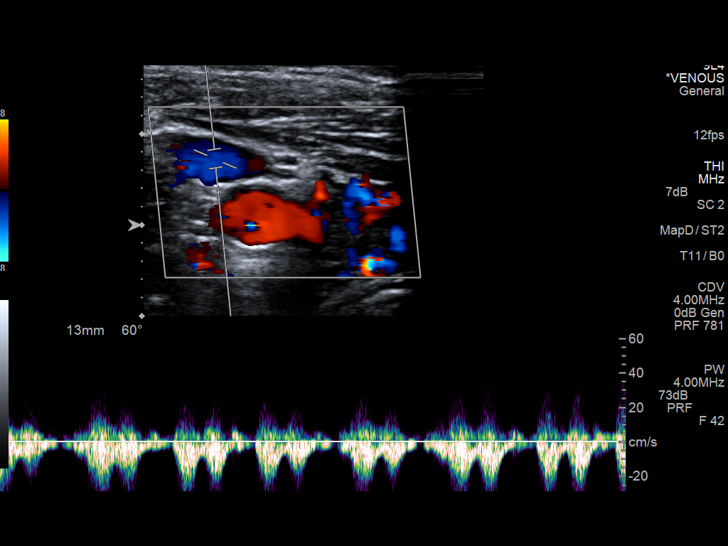
[im 10/53]
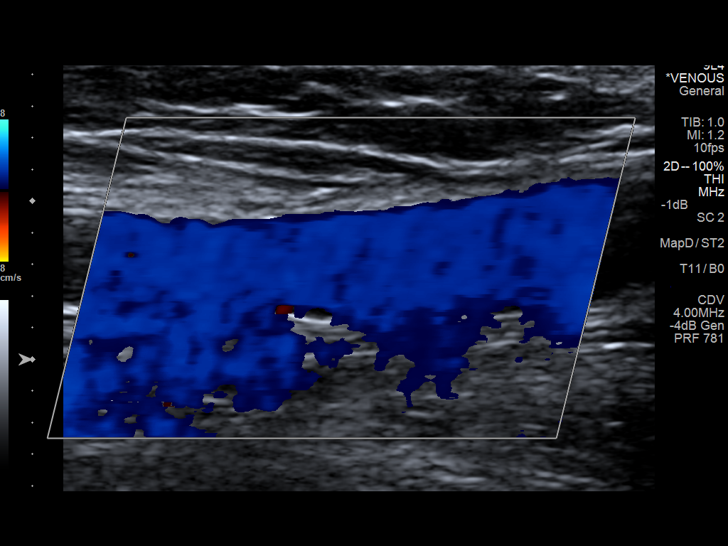
[im 14/53]
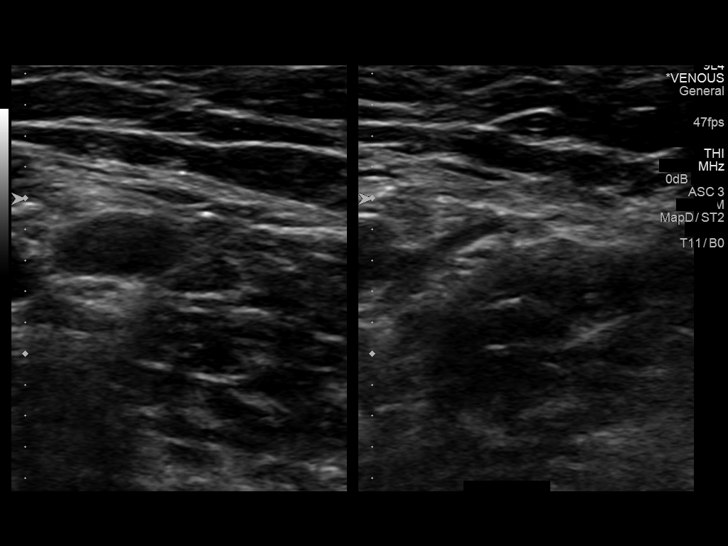
[im 19/53]
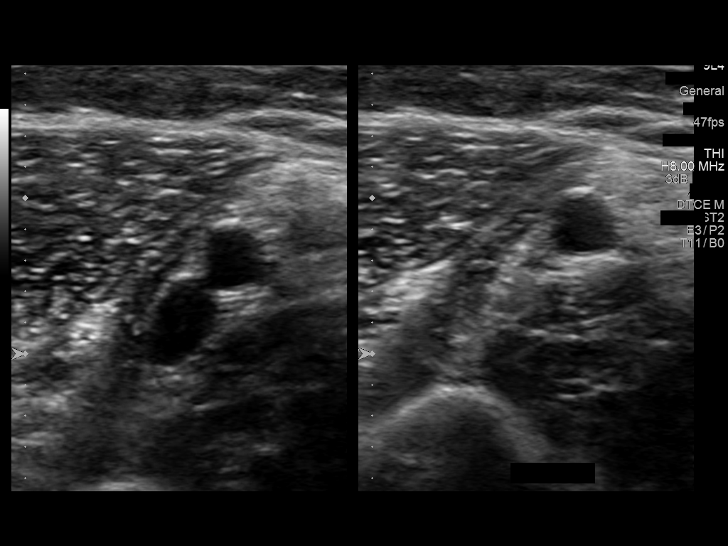
[im 23/53]
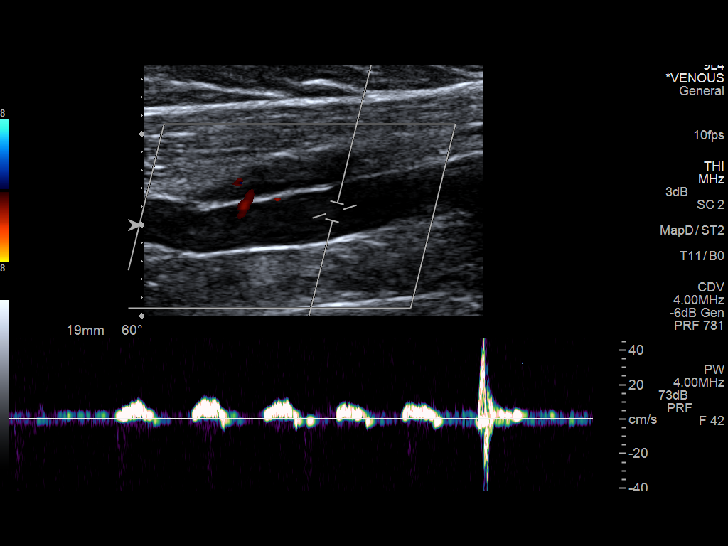
[im 28/53]
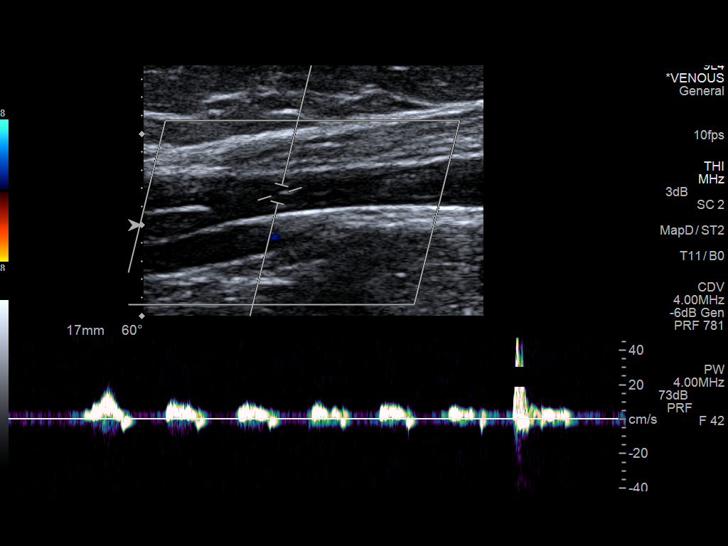
[im 30/53]
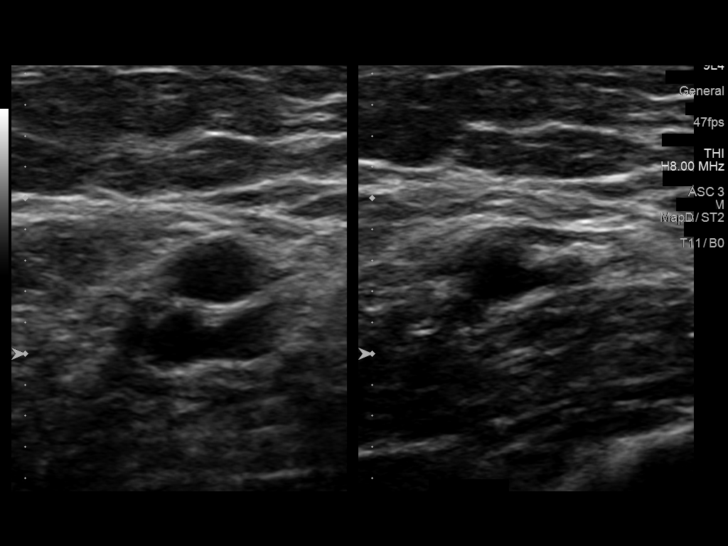
[im 34/53]
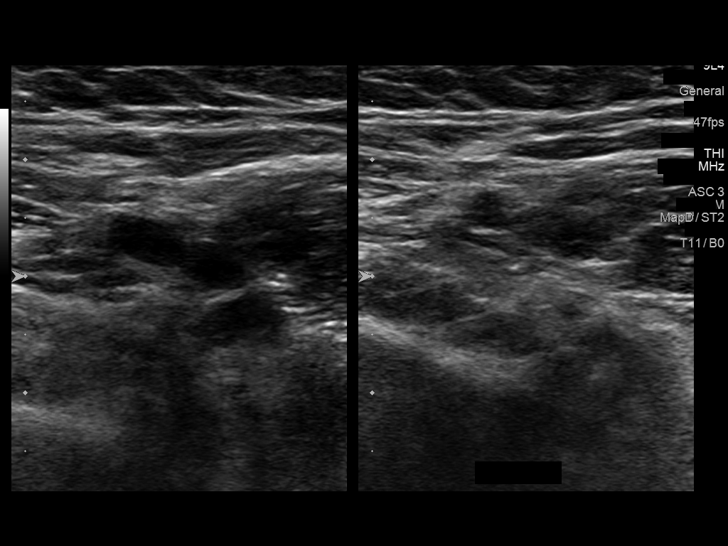
[im 39/53]
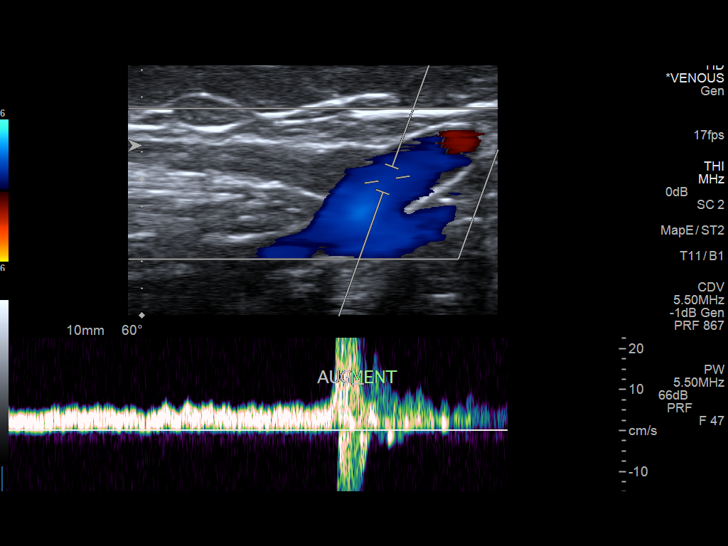
[im 43/53]
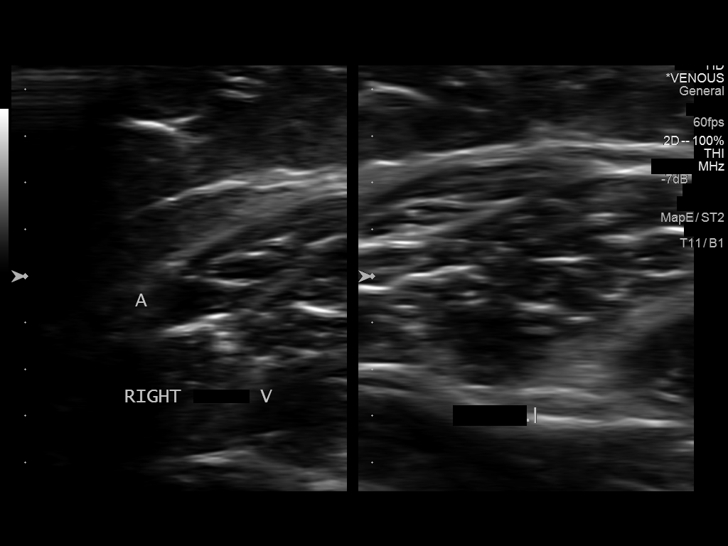
[im 48/53]
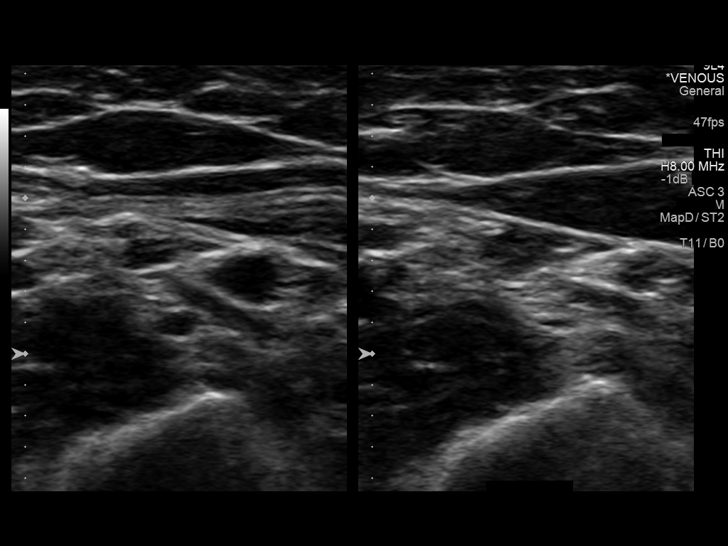
[im 53/53]
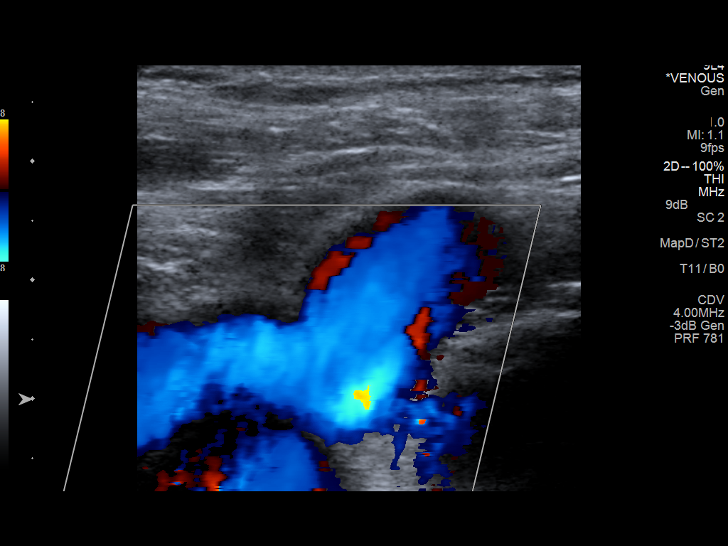

[13 of 24 positions shown; findings below may reference images not displayed]

FINDINGS: Contralateral Subclavian in Internal Jugular Vein: Respiratory
phasicity is normal and symmetric with the symptomatic side. No
evidence of thrombus. Normal compressibility.

Internal Jugular Vein: No evidence of thrombus. Normal
compressibility, respiratory phasicity and response to augmentation.

Subclavian Vein: No evidence of thrombus. Normal compressibility,
respiratory phasicity and response to augmentation.

Axillary Vein: No evidence of thrombus. Normal compressibility,
respiratory phasicity and response to augmentation.

Cephalic Vein: No evidence of thrombus. Normal compressibility,
respiratory phasicity and response to augmentation.

Basilic Vein: No evidence of thrombus. Normal compressibility,
respiratory phasicity and response to augmentation.

Brachial Veins: No evidence of thrombus. Normal compressibility,
respiratory phasicity and response to augmentation.

Radial Veins: No evidence of thrombus. Normal compressibility,
respiratory phasicity and response to augmentation.

Ulnar Veins: No evidence of thrombus. Normal compressibility,
respiratory phasicity and response to augmentation.

Venous Reflux:  None visualized.

Other Findings: Technologist notes blood flow often appeared
sluggish, however no thrombus is seen.
IMPRESSION: No evidence of deep venous thrombosis.

## 2016-09-11 ENCOUNTER — Encounter: Payer: Self-pay | Admitting: *Deleted

## 2016-09-11 ENCOUNTER — Emergency Department: Payer: Medicaid Other

## 2016-09-11 ENCOUNTER — Emergency Department
Admission: EM | Admit: 2016-09-11 | Discharge: 2016-09-11 | Disposition: A | Discharge status: Home / Self Care | Payer: Medicaid Other | Attending: Emergency Medicine | Admitting: Emergency Medicine

## 2016-09-11 DIAGNOSIS — Z87891 Personal history of nicotine dependence: Secondary | ICD-10-CM | POA: Diagnosis not present

## 2016-09-11 DIAGNOSIS — Z79899 Other long term (current) drug therapy: Secondary | ICD-10-CM | POA: Diagnosis not present

## 2016-09-11 DIAGNOSIS — R109 Unspecified abdominal pain: Secondary | ICD-10-CM

## 2016-09-11 DIAGNOSIS — E039 Hypothyroidism, unspecified: Secondary | ICD-10-CM | POA: Diagnosis not present

## 2016-09-11 DIAGNOSIS — R1031 Right lower quadrant pain: Secondary | ICD-10-CM | POA: Diagnosis not present

## 2016-09-11 LAB — CBC
HEMATOCRIT: 37 % (ref 35.0–47.0)
HEMOGLOBIN: 12.3 g/dL (ref 12.0–16.0)
MCH: 27.2 pg (ref 26.0–34.0)
MCHC: 33.3 g/dL (ref 32.0–36.0)
MCV: 81.8 fL (ref 80.0–100.0)
Platelets: 226 10*3/uL (ref 150–440)
RBC: 4.52 MIL/uL (ref 3.80–5.20)
RDW: 14.3 % (ref 11.5–14.5)
WBC: 6.5 10*3/uL (ref 3.6–11.0)

## 2016-09-11 LAB — COMPREHENSIVE METABOLIC PANEL
ALBUMIN: 4 g/dL (ref 3.5–5.0)
ALT: 11 U/L — ABNORMAL LOW (ref 14–54)
ANION GAP: 9 (ref 5–15)
AST: 23 U/L (ref 15–41)
Alkaline Phosphatase: 85 U/L (ref 38–126)
BUN: 13 mg/dL (ref 6–20)
CHLORIDE: 104 mmol/L (ref 101–111)
CO2: 25 mmol/L (ref 22–32)
Calcium: 9.3 mg/dL (ref 8.9–10.3)
Creatinine, Ser: 1.01 mg/dL — ABNORMAL HIGH (ref 0.44–1.00)
GFR calc Af Amer: >60 mL/min (ref >60)
GFR calc non Af Amer: >60 mL/min (ref >60)
GLUCOSE: 119 mg/dL — AB (ref 65–99)
POTASSIUM: 4.3 mmol/L (ref 3.5–5.1)
Sodium: 138 mmol/L (ref 135–145)
Total Bilirubin: 0.5 mg/dL (ref 0.3–1.2)
Total Protein: 8 g/dL (ref 6.5–8.1)

## 2016-09-11 LAB — URINALYSIS, COMPLETE (UACMP) WITH MICROSCOPIC
BACTERIA UA: NONE SEEN
BILIRUBIN URINE: NEGATIVE
Glucose, UA: NEGATIVE mg/dL
HGB URINE DIPSTICK: NEGATIVE
Ketones, ur: NEGATIVE mg/dL
LEUKOCYTES UA: NEGATIVE
NITRITE: NEGATIVE
PROTEIN: NEGATIVE mg/dL
RBC / HPF: NONE SEEN RBC/hpf (ref 0–5)
SPECIFIC GRAVITY, URINE: 1.011 (ref 1.005–1.030)
WBC UA: NONE SEEN WBC/hpf (ref 0–5)
pH: 6 (ref 5.0–8.0)

## 2016-09-11 LAB — LIPASE, BLOOD: LIPASE: 34 U/L (ref 11–51)

## 2016-09-11 LAB — PREGNANCY, URINE: PREG TEST UR: NEGATIVE

## 2016-09-11 MED ORDER — NAPROXEN 500 MG PO TABS: 500.0000 mg | ORAL_TABLET | Freq: Two times a day (BID) | ORAL | 0 refills | Status: DC

## 2016-09-11 NOTE — Discharge Instructions (Signed)
Your tests today were unremarkable.  Your ultrasound was normal and does not show any ovarian cysts.  Please follow up with your doctor for continued monitoring of your symptoms.

## 2016-09-11 NOTE — ED Notes (Signed)
Pt has abd pain and left side back pain.  Pt denies urinary sx.  No vag bleeding.  No n/v/d.  Pt alert.  Speech clear.

## 2016-09-11 NOTE — ED Provider Notes (Signed)
[facility] Emergency Department Provider Note  ____________________________________________  Time seen: Approximately 4:23 PM  I have reviewed the triage vital signs and the nursing notes.   HISTORY  Chief Complaint Flank Pain    HPI Kari Gallagher is a 36 y.o. female who complains of right flank pain radiating to the right lower quadrant. Denies having anything like this before. No dysuria frequency urgency. No history of kidney stones. Worse when lying down on her back, better lying on her side. He is worried that she might have an ovarian cyst. Has a history of bilateral tubal ligation about 10 years ago. Had a negative pregnancy test 1 week ago at the time when she expected her nostril. To happen, she is still not had her menses is now about a week late. Pain is moderate intensity and sharp, intermittent. denies any unusual vaginal discharge. Is in a committed relationship, patient has very low suspicion for sexually transmitted disease.    Past Medical History:  Diagnosis Date  . Depression   . Frequent headaches   . Hypothyroidism   . Leaky heart valve      Patient Active Problem List   Diagnosis Date Noted  . Chest pain, atypical 03/26/2016  . Migraine with aura and with status migrainosus, not intractable 03/26/2016  . Peripheral edema 03/26/2016  . Pre-diabetes 01/14/2016  . Insomnia 12/07/2015  . 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 Depression 10/03/2015  . Hypothyroidism 08/22/2015  . Vitamin D deficiency 08/22/2015     Past Surgical History:  Procedure Laterality Date  . CHOLECYSTECTOMY    . TUBAL LIGATION  2006     Prior to Admission medications   Medication Sig Start Date End Date Taking? Authorizing Provider  Cholecalciferol (VITAMIN D3) 2000 units capsule Take 1 capsule (2,000 Units total) by mouth daily. 12/07/15   Karamalegos, Netta Neat, DO  furosemide (LASIX) 20 MG tablet Take 1 tablet (20 mg total) by mouth daily  as needed for edema. Take Potassium supplement with this pill. 04/17/16   Karamalegos, Netta Neat, DO  ibuprofen (ADVIL,MOTRIN) 600 MG tablet Take 1 tablet (600 mg total) by mouth every 8 (eight) hours as needed. 05/31/16   Galen Manila, NP  levothyroxine (SYNTHROID, LEVOTHROID) 150 MCG tablet Take 1 tablet (150 mcg total) by mouth daily before breakfast. 04/17/16   Althea Charon, Netta Neat, DO  naproxen (NAPROSYN) 500 MG tablet Take 1 tablet (500 mg total) by mouth 2 (two) times daily with a meal. 09/11/16   Sharman Cheek, MD  potassium chloride (K-DUR) 10 MEQ tablet Take 1 tablet (10 mEq total) by mouth daily as needed. When taking Lasix fluid pill. 04/17/16 04/17/17  Karamalegos, Netta Neat, DO  predniSONE (DELTASONE) 10 MG tablet Day 1-2 take 6 pills. Day 3 take 5 pills then reduce by 1 pill each day. 05/31/16   Galen Manila, NP  sertraline (ZOLOFT) 100 MG tablet Take 1 tablet (100 mg total) by mouth daily. Patient not taking: Reported on 05/31/2016 01/13/16   Smitty Cords, DO  SUMAtriptan [facility]) 20 MG/ACT nasal spray Place 1 spray (20 mg total) into the nose every 2 (two) hours as needed for migraine or headache. May repeat in 2 hours if headache persists 03/26/16   Althea Charon, Netta Neat, DO  traZODone (DESYREL) 100 MG tablet Take 1 tablet (100 mg total) by mouth at bedtime. Start with half tablet ( ) for up to 1 week, then increase to 1 whole tablet 04/17/16   [name]  J, DO  zolpidem (AMBIEN) 5 MG tablet Take 1 tablet (5 mg total) by mouth at bedtime as needed for sleep. Patient not taking: Reported on 05/31/2016 04/17/16   Smitty Cords, DO     Allergies Patient has no known allergies.   Family History  Problem Relation Age of Onset  . Stroke Mother   . Depression Mother   . Mental illness Mother   . Heart disease Father   . Thyroid disease Father   . Lung cancer Father   . Thyroid disease Brother     Social  History Social History  Substance Use Topics  . Smoking status: Former Smoker    Packs/day: 1.00    Types: Cigarettes  . Smokeless tobacco: Former Neurosurgeon  . Alcohol use No    Review of Systems  Constitutional:   No fever or chills.  ENT:   No sore throat. No rhinorrhea. Cardiovascular:   No chest pain or syncope. Respiratory:   No dyspnea or cough. Gastrointestinal:   positive as above for abdominal pain without vomiting and diarrhea.  Musculoskeletal:   Negative for focal pain or swelling All other systems reviewed and are negative except as documented above in ROS and HPI.  ____________________________________________   PHYSICAL EXAM:  VITAL SIGNS: ED Triage Vitals  Enc Vitals Group     BP 09/11/16 1339 124/80     Pulse Rate 09/11/16 1339 80     Resp 09/11/16 1339 16     Temp 09/11/16 1339 98.5 F (36.9 C)     Temp Source 09/11/16 1339 Oral     SpO2 09/11/16 1339 100 %     Weight 09/11/16 1340 187 lb (84.8 kg)     Height 09/11/16 1340  (1.676 m)     Head Circumference --      Peak Flow --      Pain Score 09/11/16 1339 8     Pain Loc --      Pain Edu? --      Excl. in GC? --     Vital signs reviewed, nursing assessments reviewed.   Constitutional:   Alert and oriented. Well appearing and in no distress. Eyes:   No scleral icterus.  EOMI. No nystagmus. No conjunctival pallor. PERRL. ENT   Head:   Normocephalic and atraumatic.   Nose:   No congestion/rhinnorhea.    Mouth/Throat:   MMM, no pharyngeal erythema. No peritonsillar mass.    Neck:   No meningismus. Full ROM Hematological/Lymphatic/Immunilogical:   No cervical lymphadenopathy. Cardiovascular:   RRR. Symmetric bilateral radial and DP pulses.  No murmurs.  Respiratory:   Normal respiratory effort without tachypnea/retractions. Breath sounds are clear and equal bilaterally. No wheezes/rales/rhonchi. Gastrointestinal:   Soft with mild suprapubic and right lower quadrant tenderness. Non  distended.no palpable mass. There is no CVA tenderness.  No rebound, rigidity, or guarding. Genitourinary:   patient declines Musculoskeletal:   Normal range of motion in all extremities. No joint effusions.  No lower extremity tenderness.  No edema. Neurologic:   Normal speech and language.  Motor grossly intact. No gross focal neurologic deficits are appreciated.  Skin:    Skin is warm, dry and intact. No rash noted.  No petechiae, purpura, or bullae.  ____________________________________________    LABS (pertinent positives/negatives) (all labs ordered are listed, but only abnormal results are displayed) Labs Reviewed  COMPREHENSIVE METABOLIC PANEL - Abnormal; Notable for the following:       Result Value   Glucose, Bld  119 (*)    Creatinine, Ser 1.01 (*)    ALT 11 (*)    All other components within normal limits  URINALYSIS, COMPLETE (UACMP) WITH MICROSCOPIC - Abnormal; Notable for the following:    Color, Urine YELLOW (*)    APPearance CLEAR (*)    Squamous Epithelial / LPF 0-5 (*)    All other components within normal limits  LIPASE, BLOOD  CBC  PREGNANCY, URINE   ____________________________________________   EKG    ____________________________________________    RADIOLOGY  US Transvaginal Non-ob  Result Date: 09/11/2016 CLINICAL DATA:  Initial evaluation for acute right lower quadrant pain, tenderness. EXAM: TRANSABDOMINAL AND TRANSVAGINAL ULTRASOUND OF PELVIS DOPPLER ULTRASOUND OF OVARIES TECHNIQUE: Both transabdominal and transvaginal ultrasound examinations of the pelvis were performed. Transabdominal technique was performed for global imaging of the pelvis including uterus, ovaries, adnexal regions, and pelvic cul-de-sac. It was necessary to proceed with endovaginal exam following the transabdominal exam to visualize the uterus and ovaries. Color and duplex Doppler ultrasound was utilized to evaluate blood flow to the ovaries. COMPARISON:  None. FINDINGS:  Uterus Measurements: 10.5 x 4.5 x 5.1 cm. No fibroids or other mass visualized. Endometrium Thickness: 8 mm.  No focal abnormality visualized. Right ovary Measurements: 3.1 x 1.9 x 2.7 cm. Normal appearance/no adnexal mass. Left ovary Measurements: 3.6 x 1.5 x 1.7 cm. Normal appearance/no adnexal mass. Pulsed Doppler evaluation of both ovaries demonstrates normal low-resistance arterial and venous waveforms. Other findings No abnormal free fluid. IMPRESSION: Normal pelvic ultrasound. No evidence for torsion or other acute abnormality. Electronically Signed   By: Rise Mu M.D.   On: 09/11/2016 16:40   US Pelvis Complete  Result Date: 09/11/2016 CLINICAL DATA:  Initial evaluation for acute right lower quadrant pain, tenderness. EXAM: TRANSABDOMINAL AND TRANSVAGINAL ULTRASOUND OF PELVIS DOPPLER ULTRASOUND OF OVARIES TECHNIQUE: Both transabdominal and transvaginal ultrasound examinations of the pelvis were performed. Transabdominal technique was performed for global imaging of the pelvis including uterus, ovaries, adnexal regions, and pelvic cul-de-sac. It was necessary to proceed with endovaginal exam following the transabdominal exam to visualize the uterus and ovaries. Color and duplex Doppler ultrasound was utilized to evaluate blood flow to the ovaries. COMPARISON:  None. FINDINGS: Uterus Measurements: 10.5 x 4.5 x 5.1 cm. No fibroids or other mass visualized. Endometrium Thickness: 8 mm.  No focal abnormality visualized. Right ovary Measurements: 3.1 x 1.9 x 2.7 cm. Normal appearance/no adnexal mass. Left ovary Measurements: 3.6 x 1.5 x 1.7 cm. Normal appearance/no adnexal mass. Pulsed Doppler evaluation of both ovaries demonstrates normal low-resistance arterial and venous waveforms. Other findings No abnormal free fluid. IMPRESSION: Normal pelvic ultrasound. No evidence for torsion or other acute abnormality. Electronically Signed   By: Rise Mu M.D.   On: 09/11/2016 16:40   [place]  Art/ven Flow Abd Pelv Doppler  Result Date: 09/11/2016 CLINICAL DATA:  Initial evaluation for acute right lower quadrant pain, tenderness. EXAM: TRANSABDOMINAL AND TRANSVAGINAL ULTRASOUND OF PELVIS DOPPLER ULTRASOUND OF OVARIES TECHNIQUE: Both transabdominal and transvaginal ultrasound examinations of the pelvis were performed. Transabdominal technique was performed for global imaging of the pelvis including uterus, ovaries, adnexal regions, and pelvic cul-de-sac. It was necessary to proceed with endovaginal exam following the transabdominal exam to visualize the uterus and ovaries. Color and duplex Doppler ultrasound was utilized to evaluate blood flow to the ovaries. COMPARISON:  None. FINDINGS: Uterus Measurements: 10.5 x 4.5 x 5.1 cm. No fibroids or other mass visualized. Endometrium Thickness: 8 mm.  No focal abnormality visualized. Right ovary  Measurements: 3.1 x 1.9 x 2.7 cm. Normal appearance/no adnexal mass. Left ovary Measurements: 3.6 x 1.5 x 1.7 cm. Normal appearance/no adnexal mass. Pulsed Doppler evaluation of both ovaries demonstrates normal low-resistance arterial and venous waveforms. Other findings No abnormal free fluid. IMPRESSION: Normal pelvic ultrasound. No evidence for torsion or other acute abnormality. Electronically Signed   By: Rise MuBenjamin  McClintock M.D.   On: 09/11/2016 16:40    ____________________________________________   PROCEDURES Procedures  ____________________________________________   INITIAL IMPRESSION / ASSESSMENT AND PLAN / ED COURSE  Pertinent labs & imaging results that were available during my care of the patient were reviewed by me and considered in my medical decision making (see chart for details).  patient well appearing no acute distress, normal vital signs, normal labs. Presents with right flank pain and right lower quadrant pain, some corresponding tenderness as well. Possibly a small kidney stone versus ovarian cyst. Low suspicion for  appendicitis. Low suspicion for torsion, TOA, STI or PID. We'll get an ultrasound today, if no severe findings of the patient is suitable for discharge home.Considering the patient's symptoms, medical history, and physical examination today, I have low suspicion for cholecystitis or biliary pathology, pancreatitis, perforation or bowel obstruction, hernia, intra-abdominal abscess, AAA or dissection, volvulus or intussusception, mesenteric ischemia, or appendicitis.        ____________________________________________   FINAL CLINICAL IMPRESSION(S) / ED DIAGNOSES  Final diagnoses:  Right flank pain      New Prescriptions   NAPROXEN (NAPROSYN) 500 MG TABLET    Take 1 tablet (500 mg total) by mouth 2 (two) times daily with a meal.     Portions of this note were generated with dragon dictation software. Dictation errors may occur despite best attempts at proofreading.    Sharman CheekStafford, Henya Aguallo, MD 09/11/16 [phone]

## 2016-09-11 NOTE — ED Triage Notes (Addendum)
PT to ED reporting right sided flank and lower abd pain for over a week. Pt reports she was supposed to have a period the first week of the month and did not have one. Pt took a home pregnancy test last week that was negative but pain has continued. Pt denies NVD or urinary symptoms. No fevers reported. Pt reports she has been having white vaginal discharge for the past 2 weeks.

## 2017-03-21 ENCOUNTER — Emergency Department (HOSPITAL_COMMUNITY): Payer: Medicaid Other

## 2017-03-21 ENCOUNTER — Emergency Department (HOSPITAL_COMMUNITY)
Admission: EM | Admit: 2017-03-21 | Discharge: 2017-03-21 | Disposition: A | Discharge status: Home / Self Care | Payer: Medicaid Other | Attending: Emergency Medicine | Admitting: Emergency Medicine

## 2017-03-21 ENCOUNTER — Encounter (HOSPITAL_COMMUNITY): Payer: Self-pay | Admitting: Emergency Medicine

## 2017-03-21 ENCOUNTER — Other Ambulatory Visit: Payer: Self-pay

## 2017-03-21 DIAGNOSIS — Z79899 Other long term (current) drug therapy: Secondary | ICD-10-CM | POA: Insufficient documentation

## 2017-03-21 DIAGNOSIS — R109 Unspecified abdominal pain: Secondary | ICD-10-CM

## 2017-03-21 DIAGNOSIS — K589 Irritable bowel syndrome without diarrhea: Secondary | ICD-10-CM | POA: Insufficient documentation

## 2017-03-21 DIAGNOSIS — R112 Nausea with vomiting, unspecified: Secondary | ICD-10-CM | POA: Insufficient documentation

## 2017-03-21 DIAGNOSIS — Z87891 Personal history of nicotine dependence: Secondary | ICD-10-CM | POA: Insufficient documentation

## 2017-03-21 DIAGNOSIS — R1084 Generalized abdominal pain: Secondary | ICD-10-CM | POA: Diagnosis present

## 2017-03-21 LAB — TSH: TSH: 13.871 u[IU]/mL — ABNORMAL HIGH (ref 0.350–4.500)

## 2017-03-21 LAB — COMPREHENSIVE METABOLIC PANEL
ALBUMIN: 4.2 g/dL (ref 3.5–5.0)
ALK PHOS: 88 U/L (ref 38–126)
ALT: 12 U/L — AB (ref 14–54)
ANION GAP: 9 (ref 5–15)
AST: 19 U/L (ref 15–41)
BUN: 8 mg/dL (ref 6–20)
CALCIUM: 9.3 mg/dL (ref 8.9–10.3)
CHLORIDE: 104 mmol/L (ref 101–111)
CO2: 24 mmol/L (ref 22–32)
Creatinine, Ser: 0.94 mg/dL (ref 0.44–1.00)
GFR calc Af Amer: >60 mL/min (ref >60)
GFR calc non Af Amer: >60 mL/min (ref >60)
GLUCOSE: 95 mg/dL (ref 65–99)
Potassium: 4.4 mmol/L (ref 3.5–5.1)
SODIUM: 137 mmol/L (ref 135–145)
Total Bilirubin: 0.8 mg/dL (ref 0.3–1.2)
Total Protein: 7.9 g/dL (ref 6.5–8.1)

## 2017-03-21 LAB — CBC
HEMATOCRIT: 41.6 % (ref 36.0–46.0)
HEMOGLOBIN: 13.3 g/dL (ref 12.0–15.0)
MCH: 27.1 pg (ref 26.0–34.0)
MCHC: 32 g/dL (ref 30.0–36.0)
MCV: 84.7 fL (ref 78.0–100.0)
Platelets: 249 10*3/uL (ref 150–400)
RBC: 4.91 MIL/uL (ref 3.87–5.11)
RDW: 15.7 % — ABNORMAL HIGH (ref 11.5–15.5)
WBC: 8 10*3/uL (ref 4.0–10.5)

## 2017-03-21 LAB — URINALYSIS, ROUTINE W REFLEX MICROSCOPIC
Bilirubin Urine: NEGATIVE
Glucose, UA: NEGATIVE mg/dL
HGB URINE DIPSTICK: NEGATIVE
Ketones, ur: 5 mg/dL — AB
Leukocytes, UA: NEGATIVE
Nitrite: NEGATIVE
PH: 6 (ref 5.0–8.0)
Protein, ur: NEGATIVE mg/dL
SPECIFIC GRAVITY, URINE: 1.017 (ref 1.005–1.030)

## 2017-03-21 LAB — T4, FREE: FREE T4: 0.68 ng/dL (ref 0.61–1.12)

## 2017-03-21 LAB — LIPASE, BLOOD: Lipase: 33 U/L (ref 11–51)

## 2017-03-21 LAB — I-STAT BETA HCG BLOOD, ED (MC, WL, AP ONLY): I-stat hCG, quantitative: <5 m[IU]/mL (ref <5)

## 2017-03-21 MED ORDER — METOCLOPRAMIDE HCL 5 MG/ML IJ SOLN
10.0000 mg | Freq: Once | INTRAMUSCULAR | Status: AC
  Administered 2017-03-21: 10 mg via INTRAVENOUS
  Filled 2017-03-21: qty 2

## 2017-03-21 MED ORDER — SODIUM CHLORIDE 0.9 % IV BOLUS (SEPSIS)
1000.0000 mL | Freq: Once | INTRAVENOUS | Status: AC
  Administered 2017-03-21: 1000 mL via INTRAVENOUS

## 2017-03-21 MED ORDER — ONDANSETRON 4 MG PO TBDP: 4.0000 mg | ORAL_TABLET | Freq: Three times a day (TID) | ORAL | 0 refills | Status: DC | PRN

## 2017-03-21 MED ORDER — DICYCLOMINE HCL 10 MG PO CAPS
20.0000 mg | ORAL_CAPSULE | Freq: Once | ORAL | Status: AC
  Administered 2017-03-21: 20 mg via ORAL
  Filled 2017-03-21: qty 2

## 2017-03-21 MED ORDER — DICYCLOMINE HCL 20 MG PO TABS: 20.0000 mg | ORAL_TABLET | Freq: Three times a day (TID) | ORAL | 0 refills | Status: DC

## 2017-03-21 MED ORDER — DIPHENHYDRAMINE HCL 50 MG/ML IJ SOLN
25.0000 mg | Freq: Once | INTRAMUSCULAR | Status: AC
  Administered 2017-03-21: 25 mg via INTRAVENOUS
  Filled 2017-03-21: qty 1

## 2017-03-21 NOTE — ED Triage Notes (Signed)
Pt complaint of right abdominal pain with associated headache and n/v ongoing for 2 weeks.

## 2017-03-21 NOTE — ED Notes (Signed)
ED Provider at bedside. 

## 2017-03-21 NOTE — ED Provider Notes (Signed)
[facility]-EMERGENCY DEPT Provider Note   CSN: [id number] Arrival date & time: 03/21/17  0865     History   Chief Complaint Chief Complaint  Patient presents with  . Abdominal Pain    HPI Kari Gallagher is a 37 y.o. female.  HPI   38 year old female with past medical history as below here with generalized abdominal pain.  The patient states that over the last several weeks, she has had multiple recent stressors.  She has had associated abdominal cramping associated with changes in her bowel habits.  She states that she will eat something, developed cramping pain, then have a bowel movement.  It will be loose or intermittently hard.  She states that this is associated with her increased stress.  She also noticed that she is had mild nausea intermittently.  Denies any fevers or chills.  No focal tenderness or abnormalities.  Denies any urinary symptoms.  No vaginal bleeding or discharge.  Of note, she also reports she is unsure about her thyroid as she was told recently that she was taking too much thyroid replacement.  She does feel hot intermittently but denies any palpitations or shortness of breath.  No other medical complaints.  Past Medical History:  Diagnosis Date  . Depression   . Frequent headaches   . Hypothyroidism   . Leaky heart valve     Patient Active Problem List   Diagnosis Date Noted  . Chest pain, atypical 03/26/2016  . Migraine with aura and with status migrainosus, not intractable 03/26/2016  . Peripheral edema 03/26/2016  . Pre-diabetes 01/14/2016  . Insomnia 12/07/2015  . 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 Depression 10/03/2015  . Hypothyroidism 08/22/2015  . Vitamin D deficiency 08/22/2015    Past Surgical History:  Procedure Laterality Date  . CHOLECYSTECTOMY    . TUBAL LIGATION  2006    OB History   None      Home Medications    Prior to Admission medications   Medication Sig Start Date End Date Taking?  Authorizing Provider  levothyroxine (SYNTHROID, LEVOTHROID) 175 MCG tablet Take 87.5-175 mcg by mouth daily. Takes 175 mcg every day except 87.5 mcg on Sunday   Yes [provider]  Multiple Vitamin (MULTIVITAMIN WITH MINERALS) TABS tablet Take 1 tablet by mouth daily.   Yes [provider]  dicyclomine (BENTYL) 20 MG tablet Take 1 tablet (20 mg total) by mouth 4 (four) times daily -  before meals and at bedtime for 7 days. 03/21/17 03/28/17  Shaune Pollack, MD  furosemide (LASIX) 20 MG tablet Take 1 tablet (20 mg total) by mouth daily as needed for edema. Take Potassium supplement with this pill. Patient not taking: Reported on 03/21/2017 04/17/16   Smitty Cords, DO  levothyroxine (SYNTHROID, LEVOTHROID) 150 MCG tablet Take 1 tablet (150 mcg total) by mouth daily before breakfast. Patient not taking: Reported on 03/21/2017 04/17/16   Smitty Cords, DO  ondansetron (ZOFRAN ODT) 4 MG disintegrating tablet Take 1 tablet (4 mg total) by mouth every 8 (eight) hours as needed for nausea or vomiting. 03/21/17   Shaune Pollack, MD  potassium chloride (K-DUR) 10 MEQ tablet Take 1 tablet (10 mEq total) by mouth daily as needed. When taking Lasix fluid pill. Patient not taking: Reported on 03/21/2017 04/17/16 04/17/17  Smitty Cords, DO  sertraline (ZOLOFT) 100 MG tablet Take 1 tablet (100 mg total) by mouth daily. Patient not taking: Reported on 05/31/2016 01/13/16   [name]  J, DO  SUMAtriptan (IMITREX) 20 MG/ACT nasal spray Place 1 spray (20 mg total) into the nose every 2 (two) hours as needed for migraine or headache. May repeat in 2 hours if headache persists Patient not taking: Reported on 03/21/2017 03/26/16   Smitty CordsKaramalegos, Alexander J, DO  traZODone (DESYREL) 100 MG tablet Take 1 tablet (100 mg total) by mouth at bedtime. Start with half tablet (50mg ) for up to 1 week, then increase to 1 whole tablet Patient not taking: Reported on 03/21/2017 04/17/16    Smitty CordsKaramalegos, Alexander J, DO  zolpidem (AMBIEN) 5 MG tablet Take 1 tablet (5 mg total) by mouth at bedtime as needed for sleep. Patient not taking: Reported on 05/31/2016 04/17/16   Smitty CordsKaramalegos, Alexander J, DO    Family History Family History  Problem Relation Age of Onset  . Stroke Mother   . Depression Mother   . Mental illness Mother   . Heart disease Father   . Thyroid disease Father   . Lung cancer Father   . Thyroid disease Brother     Social History Social History   Tobacco Use  . Smoking status: Former Smoker    Packs/day: 1.00    Types: Cigarettes  . Smokeless tobacco: Former Engineer, waterUser  Substance Use Topics  . Alcohol use: No  . Drug use: No     Allergies   Patient has no known allergies.   Review of Systems Review of Systems  Constitutional: Positive for fatigue. Negative for chills and fever.  HENT: Negative for congestion and rhinorrhea.   Eyes: Negative for visual disturbance.  Respiratory: Negative for cough, shortness of breath and wheezing.   Cardiovascular: Negative for chest pain and leg swelling.  Gastrointestinal: Positive for abdominal pain, nausea and vomiting. Negative for diarrhea.  Genitourinary: Negative for dysuria and flank pain.  Musculoskeletal: Negative for neck pain and neck stiffness.  Skin: Negative for rash and wound.  Allergic/Immunologic: Negative for immunocompromised state.  Neurological: Negative for syncope, weakness and headaches.  Psychiatric/Behavioral: The patient is nervous/anxious.   All other systems reviewed and are negative.    Physical Exam Updated Vital Signs BP 112/82   Pulse 72   Temp 98.5 F (36.9 C) (Oral)   Resp 16   LMP 02/24/2017 Comment: patient signed preg test waiver  SpO2 100%   Physical Exam  Constitutional: She is oriented to person, place, and time. She appears well-developed and well-nourished. No distress.  HENT:  Head: Normocephalic and atraumatic.  Eyes: Conjunctivae are normal.    Neck: Neck supple.  Cardiovascular: Normal rate, regular rhythm and normal heart sounds. Exam reveals no friction rub.  No murmur heard. Pulmonary/Chest: Effort normal and breath sounds normal. No respiratory distress. She has no wheezes. She has no rales.  Abdominal: Soft. Normal appearance. She exhibits no distension.  Musculoskeletal: She exhibits no edema.  Neurological: She is alert and oriented to person, place, and time. She exhibits normal muscle tone.  Skin: Skin is warm. Capillary refill takes less than 2 seconds.  Psychiatric: She has a normal mood and affect.  Nursing note and vitals reviewed.    ED Treatments / Results  Labs (all labs ordered are listed, but only abnormal results are displayed) Labs Reviewed  CBC - Abnormal; Notable for the following components:      Result Value   RDW 15.7 (*)    All other components within normal limits  URINALYSIS, ROUTINE W REFLEX MICROSCOPIC - Abnormal; Notable for the following components:   APPearance CLOUDY (*)  Ketones, ur 5 (*)    All other components within normal limits  TSH - Abnormal; Notable for the following components:   TSH 13.871 (*)    All other components within normal limits  COMPREHENSIVE METABOLIC PANEL - Abnormal; Notable for the following components:   ALT 12 (*)    All other components within normal limits  T4, FREE  LIPASE, BLOOD  I-STAT BETA HCG BLOOD, ED ([facility], [facility], [facility] ONLY)    EKG  EKG Interpretation None       Radiology Dg Abd 2 Views  Result Date: 03/21/2017 CLINICAL DATA:  Right lower quadrant abdominal pain common nausea, vomiting and intra patent constipation and diarrhea for 3 weeks. EXAM: ABDOMEN - 2 VIEW COMPARISON:  None. FINDINGS: The visualized lung bases are clear. Normal bowel gas pattern. No findings for obstruction or perforation. The soft tissue shadows of the abdomen are maintained. Surgical clips in the right upper quadrant from previous cholecystectomy. The bony structures  are intact. IMPRESSION: Unremarkable abdominal radiographs. Electronically Signed   By: Rudie Meyer M.D.   On: 03/21/2017 11:08    Procedures Procedures (including critical care time)  Medications Ordered in ED Medications  sodium chloride 0.9 % bolus 1,000 mL (0 mLs Intravenous Stopped 03/21/17 1229)  metoCLOPramide (REGLAN) injection 10 mg (10 mg Intravenous Given 03/21/17 1022)  diphenhydrAMINE (BENADRYL) injection 25 mg (25 mg Intravenous Given 03/21/17 1022)  dicyclomine (BENTYL) capsule 20 mg (20 mg Oral Given 03/21/17 1022)     Initial Impression / Assessment and Plan / ED Course  I have reviewed the triage vital signs and the nursing notes.  Pertinent labs & imaging results that were available during my care of the patient were reviewed by me and considered in my medical decision making (see chart for details).  Clinical Course as of Mar 22 1999  Thu Mar 21, 2017  4111 38 year old here with diffuse abdominal pain, intermittent, associated with increased stressors.  History is consistent with likely IBS or chronic abdominal pain.  Her abdomen is completely soft on my exam.  The pain has been intermittent and she has no right lower quadrant tenderness to suggest appendicitis clinically.  Her white count is normal which is reassuring as well.  This is associated with increased stress at home.  Patient also has a history of hypothyroidism and has not had her levels checked.  Will send labs, give symptomatic control, and reassess.  No right upper quadrant tenderness or evidence of cholecystitis.   [CI]    Clinical Course User Index [CI] Shaune Pollack, MD    Lab work reviewed and is reassuring. Pt mildly dehydrated and has been given IVF. She feels improved with symptomatic management and is tolerating PO. Her HA has resolved - no leukocytosis, no neck stiffness, no fever, no s/s meningitis or encephalitis. Suspect her sx are 2/2 dehydration, viral GI illness versus IBS given  correlation with stress. Pt tolerating PO. Will d/c with outpt follow-up.   Final Clinical Impressions(s) / ED Diagnoses   Final diagnoses:  Abdominal pain  Irritable bowel syndrome, unspecified type    ED Discharge Orders        Ordered    dicyclomine (BENTYL) 20 MG tablet  3 times daily before meals & bedtime     03/21/17 1430    ondansetron (ZOFRAN ODT) 4 MG disintegrating tablet  Every 8 hours PRN     03/21/17 1430       Shaune Pollack, MD 03/21/17 2001

## 2017-06-16 ENCOUNTER — Emergency Department (HOSPITAL_COMMUNITY)
Admission: EM | Admit: 2017-06-16 | Discharge: 2017-06-16 | Disposition: A | Discharge status: Home / Self Care | Payer: Medicaid Other | Attending: Emergency Medicine | Admitting: Emergency Medicine

## 2017-06-16 ENCOUNTER — Encounter (HOSPITAL_COMMUNITY): Payer: Self-pay | Admitting: Emergency Medicine

## 2017-06-16 DIAGNOSIS — Z87891 Personal history of nicotine dependence: Secondary | ICD-10-CM | POA: Insufficient documentation

## 2017-06-16 DIAGNOSIS — K0889 Other specified disorders of teeth and supporting structures: Secondary | ICD-10-CM | POA: Insufficient documentation

## 2017-06-16 DIAGNOSIS — E039 Hypothyroidism, unspecified: Secondary | ICD-10-CM | POA: Insufficient documentation

## 2017-06-16 MED ORDER — IBUPROFEN 800 MG PO TABS
800.0000 mg | ORAL_TABLET | Freq: Once | ORAL | Status: AC
  Administered 2017-06-16: 800 mg via ORAL
  Filled 2017-06-16: qty 1

## 2017-06-16 MED ORDER — IBUPROFEN 600 MG PO TABS: 600.0000 mg | ORAL_TABLET | Freq: Four times a day (QID) | ORAL | 0 refills | Status: DC | PRN

## 2017-06-16 NOTE — ED Provider Notes (Signed)
[facility]-EMERGENCY DEPT Provider Note   CSN: [id number] Arrival date & time: 06/16/17  2841     History   Chief Complaint Chief Complaint  Patient presents with  . Dental Pain    HPI Kari Gallagher is a 37 y.o. female.  37 year old female presents with left lower third molar pain times several days.  Pain characterizes sharp and worse with mastication.  Denies any fever or chills.  No intraoral drainage noted.  Has been medicating with Tylenol without relief.     Past Medical History:  Diagnosis Date  . Depression   . Frequent headaches   . Hypothyroidism   . Leaky heart valve     Patient Active Problem List   Diagnosis Date Noted  . Chest pain, atypical 03/26/2016  . Migraine with aura and with status migrainosus, not intractable 03/26/2016  . Peripheral edema 03/26/2016  . Pre-diabetes 01/14/2016  . Insomnia 12/07/2015  . 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 Depression 10/03/2015  . Hypothyroidism 08/22/2015  . Vitamin D deficiency 08/22/2015    Past Surgical History:  Procedure Laterality Date  . CHOLECYSTECTOMY    . TUBAL LIGATION  2006     OB History   None      Home Medications    Prior to Admission medications   Medication Sig Start Date End Date Taking? Authorizing Provider  dicyclomine (BENTYL) 20 MG tablet Take 1 tablet (20 mg total) by mouth 4 (four) times daily -  before meals and at bedtime for 7 days. 03/21/17 03/28/17  Shaune Pollack, MD  furosemide (LASIX) 20 MG tablet Take 1 tablet (20 mg total) by mouth daily as needed for edema. Take Potassium supplement with this pill. Patient not taking: Reported on 03/21/2017 04/17/16   Smitty Cords, DO  levothyroxine (SYNTHROID, LEVOTHROID) 150 MCG tablet Take 1 tablet (150 mcg total) by mouth daily before breakfast. Patient not taking: Reported on 03/21/2017 04/17/16   Smitty Cords, DO  levothyroxine (SYNTHROID, LEVOTHROID) 175 MCG tablet Take  87.5-175 mcg by mouth daily. Takes 175 mcg every day except 87.5 mcg on Sunday    [provider]  Multiple Vitamin (MULTIVITAMIN WITH MINERALS) TABS tablet Take 1 tablet by mouth daily.    [provider]  ondansetron (ZOFRAN ODT) 4 MG disintegrating tablet Take 1 tablet (4 mg total) by mouth every 8 (eight) hours as needed for nausea or vomiting. 03/21/17   Shaune Pollack, MD  potassium chloride (K-DUR) 10 MEQ tablet Take 1 tablet (10 mEq total) by mouth daily as needed. When taking Lasix fluid pill. Patient not taking: Reported on 03/21/2017 04/17/16 04/17/17  Smitty Cords, DO  sertraline (ZOLOFT) 100 MG tablet Take 1 tablet (100 mg total) by mouth daily. Patient not taking: Reported on 05/31/2016 01/13/16   Smitty Cords, DO  SUMAtriptan [facility]) 20 MG/ACT nasal spray Place 1 spray (20 mg total) into the nose every 2 (two) hours as needed for migraine or headache. May repeat in 2 hours if headache persists Patient not taking: Reported on 03/21/2017 03/26/16   Smitty Cords, DO  traZODone (DESYREL) 100 MG tablet Take 1 tablet (100 mg total) by mouth at bedtime. Start with half tablet (50mg ) for up to 1 week, then increase to 1 whole tablet Patient not taking: Reported on 03/21/2017 04/17/16   Smitty Cords, DO  zolpidem (AMBIEN) 5 MG tablet Take 1 tablet (5 mg total) by mouth at bedtime as needed for sleep. Patient not  taking: Reported on 05/31/2016 04/17/16   Smitty CordsKaramalegos, Alexander J, DO    Family History Family History  Problem Relation Age of Onset  . Stroke Mother   . Depression Mother   . Mental illness Mother   . Heart disease Father   . Thyroid disease Father   . Lung cancer Father   . Thyroid disease Brother     Social History Social History   Tobacco Use  . Smoking status: Former Smoker    Packs/day: 1.00    Types: Cigarettes  . Smokeless tobacco: Former Engineer, waterUser  Substance Use Topics  . Alcohol use: No  . Drug use: No       Allergies   Patient has no known allergies.   Review of Systems Review of Systems  All other systems reviewed and are negative.    Physical Exam Updated Vital Signs BP 117/79 (BP Location: Right Arm)   Pulse 80   Temp 98.3 F (36.8 C) (Oral)   Resp 18   Ht 1.651 m (5\' 5" )   Wt 81.6 kg (180 lb)   LMP 06/16/2017   SpO2 100%   BMI 29.95 kg/m   Physical Exam  Constitutional: She is oriented to person, place, and time. She appears well-developed and well-nourished.  Non-toxic appearance. No distress.  HENT:  Head: Normocephalic and atraumatic.  Mouth/Throat:    Eyes: Pupils are equal, round, and reactive to light. Conjunctivae, EOM and lids are normal.  Neck: Normal range of motion. Neck supple. No tracheal deviation present. No thyroid mass present.  Cardiovascular: Normal rate, regular rhythm and normal heart sounds. Exam reveals no gallop.  No murmur heard. Pulmonary/Chest: Effort normal and breath sounds normal. No stridor. No respiratory distress. She has no decreased breath sounds. She has no wheezes. She has no rhonchi. She has no rales.  Abdominal: Soft. Normal appearance and bowel sounds are normal. She exhibits no distension. There is no tenderness. There is no rebound and no CVA tenderness.  Musculoskeletal: Normal range of motion. She exhibits no edema or tenderness.  Neurological: She is alert and oriented to person, place, and time. She has normal strength. No cranial nerve deficit or sensory deficit. GCS eye subscore is 4. GCS verbal subscore is 5. GCS motor subscore is 6.  Skin: Skin is warm and dry. No abrasion and no rash noted.  Psychiatric: She has a normal mood and affect. Her speech is normal and behavior is normal.  Nursing note and vitals reviewed.    ED Treatments / Results  Labs (all labs ordered are listed, but only abnormal results are displayed) Labs Reviewed - No data to display  EKG None  Radiology No results  found.  Procedures Procedures (including critical care time)  Medications Ordered in ED Medications  ibuprofen (ADVIL,MOTRIN) tablet 800 mg (has no administration in time range)     Initial Impression / Assessment and Plan / ED Course  I have reviewed the triage vital signs and the nursing notes.  Pertinent labs & imaging results that were available during my care of the patient were reviewed by me and considered in my medical decision making (see chart for details).     Patient given ibuprofen here and will be given referral to oral surgery on-call.  Will be given prescription for same.  Final Clinical Impressions(s) / ED Diagnoses   Final diagnoses:  None    ED Discharge Orders    None       Lorre NickAllen, Sirron Francesconi, MD 06/16/17 1114

## 2017-06-16 NOTE — ED Triage Notes (Signed)
Pt c/o lower lower and upper dental pain that has been intermittent but been worse over the past couple days.

## 2018-01-24 ENCOUNTER — Encounter: Payer: Self-pay | Admitting: Family Medicine

## 2018-01-24 ENCOUNTER — Ambulatory Visit (INDEPENDENT_AMBULATORY_CARE_PROVIDER_SITE_OTHER): Payer: Medicaid Other | Admitting: Family Medicine

## 2018-01-24 VITALS — BP 105/70 | HR 87 | Temp 98.4°F | Resp 16 | Ht 66.0 in | Wt 172.8 lb

## 2018-01-24 DIAGNOSIS — E034 Atrophy of thyroid (acquired): Secondary | ICD-10-CM

## 2018-01-24 DIAGNOSIS — E559 Vitamin D deficiency, unspecified: Secondary | ICD-10-CM

## 2018-01-24 DIAGNOSIS — F331 Major depressive disorder, recurrent, moderate: Secondary | ICD-10-CM

## 2018-01-24 DIAGNOSIS — F39 Unspecified mood [affective] disorder: Secondary | ICD-10-CM

## 2018-01-24 DIAGNOSIS — Z124 Encounter for screening for malignant neoplasm of cervix: Secondary | ICD-10-CM

## 2018-01-24 DIAGNOSIS — G4726 Circadian rhythm sleep disorder, shift work type: Secondary | ICD-10-CM

## 2018-01-24 DIAGNOSIS — G4701 Insomnia due to medical condition: Secondary | ICD-10-CM

## 2018-01-24 DIAGNOSIS — F419 Anxiety disorder, unspecified: Secondary | ICD-10-CM

## 2018-01-24 MED ORDER — ZOLPIDEM TARTRATE 5 MG PO TABS: 5.0000 mg | ORAL_TABLET | Freq: Every evening | ORAL | 1 refills | Status: DC | PRN

## 2018-01-24 MED ORDER — TRAZODONE HCL 100 MG PO TABS: 100.0000 mg | ORAL_TABLET | Freq: Every day | ORAL | 5 refills | Status: DC

## 2018-01-24 NOTE — Assessment & Plan Note (Addendum)
Persistent moderate depressive symptoms, mixed anxiety Uncontrolled off medications, SSRI Trazodone Secondary insomnia, related to mood and shift work  Plan - Restart Trazodone titration for insomnia and mood - Consider future restart SSRI but for now hold, given question if possible bipolar or other underlying mood disorder - Rx Ambien PRN insomnia - Treat hypothyroid - Follow-up within 3 months, if still poor sleep or overall mood not improving, will recommend psychiatry next for work-up and management

## 2018-01-24 NOTE — Patient Instructions (Addendum)
Thank you for coming to the office today.  Labs today, stay tuned for results, likely if TSH is high we will send new thyroid medicine back in, likely back at the   If not improving we will refer you to [facility] for 2nd opinion  ----- For mood / insomnia  - I think this is related to depression and some anxiety, I am also concerned about Bipolar, which can cause you to have more energy and feel unusual and poor sleep  Start back on Trazodone HALF pill nightly for 1 week, then increase to 1 whole pill. Every night.  You can try Ambien as needed to help catch up on sleep or reset sleep cycle.  Sleep Hygiene Recommendations to promote healthy sleep in all patients, especially if symptoms of insomnia are worsening. Due to the nature of sleep rhythms, if your body gets "out of rhythm", it may take some time before your sleep cycle can be "reset".  Please try to follow as many of the following tips as you can, usually there are only a few of these are the primary cause of the problem.  ?To reset your sleep rhythm, go to bed and get up at the same time every day ?Sleep only long enough to feel rested and then get out of bed ?Do not try to force yourself to sleep. If you can't sleep, get out of bed and try again later. ?Avoid naps during the day, unless excessively tired. The more sleeping during the day, then the less sleep your body needs at night.  ?Have coffee, tea, and other foods that have caffeine only in the morning ?Exercise several days a week, but not right before bed ?If you drink alcohol, prefer to have appropriate drink with one meal, but prefer to avoid alcohol in the evening, and bedtime ?If you smoke, avoid smoking, especially in the evening  ?Avoid watching TV or looking at phones, computers, or reading devices ("e-books") that give off light at least 30 minutes before bed. This artificial light sends "awake signals" to your brain and can make it harder  to fall asleep. ?Make your bedroom a comfortable place where it is easy to fall asleep: ? Put up shades or special blackout curtains to block light from outside. ? Use a white noise machine to block noise. ? Keep the temperature cool. ?Try your best to solve or at least address your problems before you go to bed ?Use relaxation techniques to manage stress. Ask your health care provider to suggest some techniques that may work well for you. These may include: ? Breathing exercises. ? Routines to release muscle tension. ? Visualizing peaceful scenes.   Please schedule a Follow-up Appointment to: Return in about 3 months (around 04/25/2018) for Depression, Insomnia, Thyroid.  If you have any other questions or concerns, please feel free to call the office or send a message through MyChart. You may also schedule an earlier appointment if necessary.  Additionally, you may be receiving a survey about your experience at our office within a few days to 1 week by e-mail or mail. We value your feedback.  [name], DO [facility], [place]

## 2018-01-24 NOTE — Assessment & Plan Note (Signed)
Check lab Vit D today

## 2018-01-24 NOTE — Assessment & Plan Note (Signed)
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 But still poor sleep. - Underlying cause primarily mood disorder depression vs possible bipolar based on MDQ screening and history - Prior meds Trazodone, Ambien, SSRI - non adherence due to lost follow-up  Plan - RESTART Trazodone 100mg  nightly - start half tab 50mg , then 1 week inc to 100 - adjust as needed - Rx Ambien 5mg  nightly PRN short term only, reset sleep cycle and use PRN - Sleep hygiene handout - Treat thyroid - Future advised if not improving, would recommend referral to Psychiatry to discuss possible Bipolar diagnosis may ultimately be causing her insomnia

## 2018-01-24 NOTE — Assessment & Plan Note (Signed)
See A&P Insomnia, Mood Likely factor for her symptoms

## 2018-01-24 NOTE — Assessment & Plan Note (Addendum)
Uncontrolled hypothyroidism, awaiting lab result pending today Prior TSH variable, was on diff doses of levothyroxine, had seen [facility], lost to follow-up at endo and w/ PCP  Likely large factor for poor sleep insomnia / mood  Plan - Check TSH, Free T4 today - Pending result, likely will restart approx Levothyroxine daily - If suboptimal control will have very low threshold to refer her to diff endocrine, she prefers [place] location now due to living situation

## 2018-01-24 NOTE — Progress Notes (Signed)
Subjective:    Patient ID: Kari Gallagher, female    DOB: 03/23/1980, 38 y.o.   MRN: [id number]  Kari RougeJowanda S Kari Gallagher is a 38 y.o. female presenting on 01/24/2018 for Insomnia and Depression  Patient's last visit was in 2018. She now returns to care. She is currently residing in [place].  HPI   INSOMNIA / SHIFT WORK SLEEP DISORDER: Last visit with me April to May in 2018, managed on Trazodone 100mg  nightly and Ambien 5mg  PRN, as well as Sertraline SSRI. 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 Still very poor sleep, insomnia, difficulty falling asleep and staying asleep, often only getting few hours of sleep. She attributes this to her shift work job and also her insomnia or mental health and thyroid problem. She wants to get tests and restart her therapy if possible. - She feels like she is often awake with energy at night, and often feels need to clean and do lots of tasks, see scoring below w/ MDQ - She did well on some of the medicines previously, does not recall which were more helpful - Denies headache, pain keeping her awake   Major Depression, Recurrent, moderate, with anxiety Known history of depression and some mixed mood / anxiety. She did well on SSRI and Trazodone in past. She primarily has concern with insomnia. She has reduced energy. Seems off meds not doing as well. She would like to restart. Not seeing Psychiatry or other mental health help. - No prior dx bipolar. She does admit a lot of symptoms below - see MDQ, screening - Denies agitation, panic attacks, suicidal or homicidal ideation  HYPOTHYROIDISM: History of hyperthyroidism, s/p post operative radiation therapy, has been on chronic levothyroxine supplement therapy since, has been on fluctuating doses in past  125 to 150. - Ref[facility], has seen them and testing showed she was overtreated with TSH low, after levothyroxine 175mcg, this was lowered - Now she has been off med for >1 year    Health Maintenance: Due for Flu Shot, declines today despite counseling on benefits  Declines TDap  Due for routine pap smear, prior >3 years ago, denies abnormal pap. She request refer GYN in [place].  [facility] 01/24/2018 01/13/2016 12/07/2015  Decreased Interest 2 2 1   Down, Depressed, Hopeless 2 1 1   PHQ - 2 Score 4 3 2   Altered sleeping 3 1 3   Tired, decreased energy 3 2 3   Change in appetite 3 3 1   Feeling bad or failure about yourself  2 1 0  Trouble concentrating 2 3 3   Moving slowly or fidgety/restless 3 1 2   Suicidal thoughts 0 0 0  PHQ-9 Score 20 14 14   Difficult doing work/chores Somewhat difficult Not difficult at all Somewhat difficult   GAD 7 : Generalized Anxiety Score 01/24/2018  Nervous, Anxious, on Edge 2  Control/stop worrying 3  Worry too much - different things 3  Trouble relaxing 3  Restless 3  Easily annoyed or irritable 2  Afraid - awful might happen 1  Total GAD 7 Score 17  Anxiety Difficulty Somewhat difficult   Mood Disorder Questionnaire (MDQ) - Bipolar Screening 1. Has there ever been a period of time when you were not your usual self  and... . You felt so good or so hyper that other people thought you were not your normal self or that you got into trouble? Yes . You were so irritable that you shouted at people or started fights or arguments? No . You felt much more self-confident than usual? Yes  . You got much less sleep than usual and found that you didn't really miss it? Yes  . You were more talkative or spoke much faster than usual? Yes  . Thoughts raced through your head or you couldn't slow your mind down? Yes  . You were so easily distracted by things around you that you had trouble concentrating or staying on track? No . You had  more energy than usual? Yes  . You were much more active or did many more things than usual? Yes  . You were much more social or outgoing than usual, for example, you called friends in the middle of the night? No . You were much more interested in sex than usual?  No . You did things that were unusual for you or that other people might have thought were excessive, foolish, or risky?  No . Spending money got you or your family in trouble? No  2. If you checked YES to more than one of the above, have several of these ever happened during the same period of time? Yes   3. How much of a problem did any of these cause you - like being unable to work; having family , money or legal troubles; getting into arguments or fights? [  ] No problems    [  ] Minor problem    [X]  Moderate problem    [  ] Serious problem    Past Medical History:  Diagnosis Date  . Frequent headaches   . Hypothyroidism   . Leaky heart valve    Past Surgical History:  Procedure Laterality Date  . CHOLECYSTECTOMY    . TUBAL LIGATION  2006   Social History   Socioeconomic History  . Marital status: Married    Spouse name: Not on file  . Number of children: Not on file  . Years of education: Not on file  . Highest education level: Not on file  Occupational History  . Not on file  Social Needs  . Financial resource strain: Not on file  . Food insecurity:    Worry: Not on file    Inability: Not on file  . Transportation needs:    Medical: Not on file    Non-medical: Not on file  Tobacco Use  . Smoking status: Current Every Day Smoker    Packs/day: 1.00    Types: Cigarettes  . Smokeless tobacco: Current User  Substance and Sexual Activity  . Alcohol use: No  . Drug use: No  . Sexual activity: Not on file  Lifestyle  . Physical activity:    Days per week: Not on file    Minutes per session: Not on file  . Stress: Not on file  Relationships  . Social connections:    Talks on phone: Not on file     Gets together: Not on file    Attends religious service: Not on file    Active member of club or organization: Not on file    Attends meetings of clubs or organizations: Not on file    Relationship status: Not on file  . Intimate partner violence:    Fear of current or ex partner: Not on  file    Emotionally abused: Not on file    Physically abused: Not on file    Forced sexual activity: Not on file  Other Topics Concern  . Not on file  Social History Narrative  . Not on file   Family History  Problem Relation Age of Onset  . Stroke Mother   . Depression Mother   . Mental illness Mother   . Heart disease Father   . Thyroid disease Father   . Lung cancer Father   . Thyroid disease Brother    Current Outpatient Medications on File Prior to Visit  Medication Sig  . ibuprofen (ADVIL,MOTRIN) 600 MG tablet Take 1 tablet (600 mg total) by mouth every 6 (six) hours as needed.  . Multiple Vitamin (MULTIVITAMIN WITH MINERALS) TABS tablet Take 1 tablet by mouth daily.  . sertraline (ZOLOFT) 100 MG tablet Take 1 tablet (100 mg total) by mouth daily. (Patient not taking: Reported on 05/31/2016)   No current facility-administered medications on file prior to visit.     Review of Systems Per HPI unless specifically indicated above      Objective:    BP 105/70   Pulse 87   Temp 98.4 F (36.9 C) (Oral)   Resp 16   Ht 5\' 6"  (1.676 m)   Wt 172 lb 12.8 oz (78.4 kg)   SpO2 100%   BMI 27.89 kg/m   Wt Readings from Last 3 Encounters:  01/24/18 172 lb 12.8 oz (78.4 kg)  06/16/17 180 lb (81.6 kg)  09/11/16 187 lb (84.8 kg)    Physical Exam Vitals signs and nursing note reviewed.  Constitutional:      General: She is not in acute distress.    Appearance: She is well-developed. She is not diaphoretic.     Comments: Well but tired or sleepy appearing, comfortable, cooperative  HENT:     Head: Normocephalic and atraumatic.  Eyes:     General:        Right eye: No discharge.         Left eye: No discharge.     Conjunctiva/sclera: Conjunctivae normal.  Neck:     Musculoskeletal: Normal range of motion and neck supple.     Thyroid: No thyromegaly.  Cardiovascular:     Rate and Rhythm: Normal rate and regular rhythm.     Heart sounds: Normal heart sounds. No murmur.  Pulmonary:     Effort: Pulmonary effort is normal. No respiratory distress.     Breath sounds: Normal breath sounds. No wheezing or rales.  Musculoskeletal: Normal range of motion.  Lymphadenopathy:     Cervical: No cervical adenopathy.  Skin:    General: Skin is warm and dry.     Findings: No erythema or rash.  Neurological:     Mental Status: She is alert and oriented to person, place, and time.  Psychiatric:        Behavior: Behavior normal.     Comments: Well groomed, good eye contact, normal speech and thoughts. Good mood and not anxious. No rapid or pressured speech. Cooperative.    Results for orders placed or performed during the hospital encounter of 03/21/17  CBC  Result Value Ref Range   WBC 8.0 4.0 - 10.5 K/uL   RBC 4.91 3.87 - 5.11 MIL/uL   Hemoglobin 13.3 12.0 - 15.0 g/dL   HCT 16.1 09.6 - 04.5 %   MCV 84.7 78.0 - 100.0 fL   MCH 27.1 26.0 - 34.0 pg  MCHC 32.0 30.0 - 36.0 g/dL   RDW 82.915.7 (H) 56.211.5 - 13.015.5 %   Platelets 249 150 - 400 K/uL  Urinalysis, Routine w reflex microscopic  Result Value Ref Range   Color, Urine YELLOW YELLOW   APPearance CLOUDY (A) CLEAR   Specific Gravity, Urine 1.017 1.005 - 1.030   pH 6.0 5.0 - 8.0   Glucose, UA NEGATIVE NEGATIVE mg/dL   Hgb urine dipstick NEGATIVE NEGATIVE   Bilirubin Urine NEGATIVE NEGATIVE   Ketones, ur 5 (A) NEGATIVE mg/dL   Protein, ur NEGATIVE NEGATIVE mg/dL   Nitrite NEGATIVE NEGATIVE   Leukocytes, UA NEGATIVE NEGATIVE  TSH  Result Value Ref Range   TSH 13.871 (H) 0.350 - 4.500 uIU/mL  T4, free  Result Value Ref Range   Free T4 0.68 0.61 - 1.12 ng/dL  Comprehensive metabolic panel  Result Value Ref Range   Sodium 137  135 - 145 mmol/L   Potassium 4.4 3.5 - 5.1 mmol/L   Chloride 104 101 - 111 mmol/L   CO2 24 22 - 32 mmol/L   Glucose, Bld 95 65 - 99 mg/dL   BUN 8 6 - 20 mg/dL   Creatinine, Ser 8.650.94 0.44 - 1.00 mg/dL   Calcium 9.3 8.9 - 78.410.3 mg/dL   Total Protein 7.9 6.5 - 8.1 g/dL   Albumin 4.2 3.5 - 5.0 g/dL   AST 19 15 - 41 U/L   ALT 12 (L) 14 - 54 U/L   Alkaline Phosphatase 88 38 - 126 U/L   Total Bilirubin 0.8 0.3 - 1.2 mg/dL   GFR calc non Af Amer >60 >60 mL/min   GFR calc Af Amer >60 >60 mL/min   Anion gap 9 5 - 15  Lipase, blood  Result Value Ref Range   Lipase 33 11 - 51 U/L  I-Stat beta hCG blood, ED  Result Value Ref Range   I-stat hCG, quantitative <5.0 <5 mIU/mL   Comment 3              Assessment & Plan:   Problem List Items Addressed This Visit    Hypothyroidism    Uncontrolled hypothyroidism, awaiting lab result pending today Prior TSH variable, was on diff doses of levothyroxine, had seen [facility], lost to follow-up at endo and w/ PCP  Likely large factor for poor sleep insomnia / mood  Plan - Check TSH, Free T4 today - Pending result, likely will restart approx Levothyroxine 150mcg daily - If suboptimal control will have very low threshold to refer her to diff endocrine, [place] due to living situation      Relevant Orders   TSH   T4, free   COMPLETE METABOLIC PANEL WITH GFR   Insomnia    Significant problem, with insomnia impacting daily function / job, currently coping with this, she is on 2nd shift job now, seems improved compared to her 3rd shift job. But still poor sleep. - Underlying cause primarily mood disorder depression vs possible bipolar based on MDQ screening and history - Prior meds Trazodone, Ambien, SSRI - non adherence due to lost follow-up  Plan - RESTART Trazodone 100mg  nightly - start half tab 50mg , then 1 week inc to 100 - adjust as needed - Rx Ambien 5mg  nightly PRN short term only, reset sleep cycle and use  PRN - Sleep hygiene handout - Treat thyroid - Future advised if not improving, would recommend referral to Psychiatry to discuss possible Bipolar diagnosis may ultimately be causing her  insomnia      Relevant Medications   traZODone (DESYREL) 100 MG tablet   zolpidem (AMBIEN) 5 MG tablet   Recurrent moderate major depressive disorder with anxiety (HCC) - Primary    Persistent moderate depressive symptoms, mixed anxiety Uncontrolled off medications, SSRI Trazodone Secondary insomnia, related to mood and shift work  Plan - Restart Trazodone titration for insomnia and mood - Consider future restart SSRI but for now hold, given question if possible bipolar or other underlying mood disorder - Rx Ambien PRN insomnia - Treat hypothyroid - Follow-up within 3 months, if still poor sleep or overall mood not improving, will recommend psychiatry next for work-up and management      Relevant Medications   traZODone (DESYREL) 100 MG tablet   zolpidem (AMBIEN) 5 MG tablet   Shift work sleep disorder    See A&P Insomnia, Mood Likely factor for her symptoms      Relevant Medications   zolpidem (AMBIEN) 5 MG tablet   Vitamin D deficiency    Check lab Vit D today      Relevant Orders   VITAMIN D 25 Hydroxy (Vit-D Deficiency, Fractures)    Other Visit Diagnoses    Cervical cancer screening       Relevant Orders   Ambulatory referral to Gynecology   Mood disorder (HCC)          Meds ordered this encounter  Medications  . traZODone (DESYREL) 100 MG tablet    Sig: Take 1 tablet (100 mg total) by mouth at bedtime. Start with half tablet (50mg ) for up to 1 week, then increase to 1 whole tablet    Dispense:  30 tablet    Refill:  5  . zolpidem (AMBIEN) 5 MG tablet    Sig: Take 1 tablet (5 mg total) by mouth at bedtime as needed for sleep.    Dispense:  15 tablet    Refill:  1   Orders Placed This Encounter  Procedures  . TSH  . T4, free  . COMPLETE METABOLIC PANEL WITH GFR  .  VITAMIN D 25 Hydroxy (Vit-D Deficiency, Fractures)  . Ambulatory referral to Gynecology    Referral Priority:   Routine    Referral Type:   Consultation    Referral Reason:   Specialty Services Required    Requested Specialty:   Gynecology    Number of Visits Requested:   1    Follow up plan: Return in about 3 months (around 04/25/2018) for Depression, Insomnia, Thyroid.  [name], DO [facility] 01/24/2018, 10:26 AM

## 2018-02-03 ENCOUNTER — Other Ambulatory Visit: Payer: Self-pay | Admitting: Family Medicine

## 2018-02-03 DIAGNOSIS — G43101 Migraine with aura, not intractable, with status migrainosus: Secondary | ICD-10-CM | POA: Diagnosis not present

## 2018-02-03 DIAGNOSIS — E034 Atrophy of thyroid (acquired): Secondary | ICD-10-CM

## 2018-02-03 DIAGNOSIS — E559 Vitamin D deficiency, unspecified: Secondary | ICD-10-CM

## 2018-02-03 DIAGNOSIS — F331 Major depressive disorder, recurrent, moderate: Secondary | ICD-10-CM | POA: Diagnosis not present

## 2018-02-03 DIAGNOSIS — F419 Anxiety disorder, unspecified: Secondary | ICD-10-CM

## 2018-02-04 LAB — T4, FREE: Free T4: 0.71 ng/dL — ABNORMAL LOW (ref 0.82–1.77)

## 2018-02-04 LAB — COMPREHENSIVE METABOLIC PANEL
A/G RATIO: 1.4 (ref 1.2–2.2)
ALK PHOS: 87 IU/L (ref 39–117)
ALT: 11 IU/L (ref 0–32)
AST: 17 IU/L (ref 0–40)
Albumin: 4.4 g/dL (ref 3.8–4.8)
BILIRUBIN TOTAL: 0.3 mg/dL (ref 0.0–1.2)
BUN/Creatinine Ratio: 9 (ref 9–23)
BUN: 9 mg/dL (ref 6–20)
CALCIUM: 9.7 mg/dL (ref 8.7–10.2)
CHLORIDE: 104 mmol/L (ref 96–106)
CO2: 20 mmol/L (ref 20–29)
Creatinine, Ser: 1.02 mg/dL — ABNORMAL HIGH (ref 0.57–1.00)
GFR calc Af Amer: 81 mL/min/{1.73_m2} (ref >59)
GFR, EST NON AFRICAN AMERICAN: 70 mL/min/{1.73_m2} (ref >59)
GLOBULIN, TOTAL: 3.2 g/dL (ref 1.5–4.5)
Glucose: 89 mg/dL (ref 65–99)
POTASSIUM: 4.6 mmol/L (ref 3.5–5.2)
SODIUM: 138 mmol/L (ref 134–144)
Total Protein: 7.6 g/dL (ref 6.0–8.5)

## 2018-02-04 LAB — VITAMIN D 25 HYDROXY (VIT D DEFICIENCY, FRACTURES): VIT D 25 HYDROXY: 14.4 ng/mL — AB (ref 30.0–100.0)

## 2018-02-04 LAB — TSH: TSH: 25.79 u[IU]/mL — AB (ref 0.450–4.500)

## 2018-02-05 ENCOUNTER — Other Ambulatory Visit: Payer: Self-pay | Admitting: Family Medicine

## 2018-02-05 DIAGNOSIS — E034 Atrophy of thyroid (acquired): Secondary | ICD-10-CM

## 2018-02-05 MED ORDER — LEVOTHYROXINE SODIUM 150 MCG PO TABS: 150.0000 ug | ORAL_TABLET | Freq: Every day | ORAL | 1 refills | Status: DC

## 2018-02-18 ENCOUNTER — Telehealth: Payer: Self-pay | Admitting: Obstetrics & Gynecology

## 2018-02-18 NOTE — Telephone Encounter (Signed)
Kari Gallagher referring for Cervical Screening. Voicemail is full unable to leave voicemail for patient to call back to be schedule. Spoke with Fleet Contras at [facility] explaining phone number on file voicemail box is full.

## 2018-02-21 DIAGNOSIS — F17210 Nicotine dependence, cigarettes, uncomplicated: Secondary | ICD-10-CM

## 2018-02-21 DIAGNOSIS — Z720 Tobacco use: Secondary | ICD-10-CM

## 2018-02-26 MED ORDER — VARENICLINE TARTRATE 0.5 MG X 11 & 1 MG X 42 PO MISC: ORAL | 0 refills | Status: DC

## 2018-02-26 NOTE — Addendum Note (Signed)
Addended by: Smitty Cords on: 02/26/2018 06:06 PM   Modules accepted: Orders

## 2018-02-28 ENCOUNTER — Telehealth: Payer: Self-pay

## 2018-02-28 NOTE — Telephone Encounter (Signed)
Received prior approval request from the pharmacy called insurance company to initiate it. As per pharmacy, Patient actually doesn't need prior approval for chantix but pharmacy needs to either change NDC number or can change from 30 days to 28 days and can generally covers. Conform with pharmacy and they changed NDC number and able to run this med's without prior approval.

## 2018-03-05 ENCOUNTER — Ambulatory Visit: Payer: Self-pay | Admitting: Obstetrics and Gynecology

## 2018-03-21 ENCOUNTER — Ambulatory Visit: Payer: Self-pay | Admitting: Obstetrics and Gynecology

## 2018-04-18 DIAGNOSIS — E034 Atrophy of thyroid (acquired): Secondary | ICD-10-CM

## 2018-04-22 ENCOUNTER — Ambulatory Visit (INDEPENDENT_AMBULATORY_CARE_PROVIDER_SITE_OTHER): Payer: Medicaid Other | Admitting: Family Medicine

## 2018-04-22 ENCOUNTER — Encounter: Payer: Self-pay | Admitting: Family Medicine

## 2018-04-22 ENCOUNTER — Other Ambulatory Visit: Payer: Self-pay

## 2018-04-22 DIAGNOSIS — F419 Anxiety disorder, unspecified: Secondary | ICD-10-CM

## 2018-04-22 DIAGNOSIS — F331 Major depressive disorder, recurrent, moderate: Secondary | ICD-10-CM | POA: Diagnosis not present

## 2018-04-22 DIAGNOSIS — G4726 Circadian rhythm sleep disorder, shift work type: Secondary | ICD-10-CM

## 2018-04-22 DIAGNOSIS — E034 Atrophy of thyroid (acquired): Secondary | ICD-10-CM | POA: Diagnosis not present

## 2018-04-22 NOTE — Patient Instructions (Addendum)
I'm glad to hear you are doing better  Stay tuned for Lab results once we release them to your MyChart with instructions  Call if question  For now continue Levothyroxine daily until we notify you if we need to change or not  Continue Trazodone every night  If you need Ambien again in future notify me and we can discuss it  If thyroid is not well controlled still then we can refer to Endocrine in [facility] if you need  Follow-up in 3 to 6 months pending lab results and how you are doing - call when ready to schedule  Please schedule a Follow-up Appointment to: Return in about 3 months (around 07/22/2018) for Thyroid, Sleep, Mood.  If you have any other questions or concerns, please feel free to call the office or send a message through MyChart. You may also schedule an earlier appointment if necessary.  Additionally, you may be receiving a survey about your experience at our office within a few days to 1 week by e-mail or mail. We value your feedback.  [name], DO [facility], [place]

## 2018-04-22 NOTE — Progress Notes (Signed)
Virtual Visit via Telephone The purpose of this virtual visit is to provide medical care while limiting exposure to the novel coronavirus (COVID19) for both patient and office staff.  Consent was obtained for phone visit:  Yes.   Answered questions that patient had about telehealth interaction:  Yes.   I discussed the limitations, risks, security and privacy concerns of performing an evaluation and management service by telephone. I also discussed with the patient that there may be a patient responsible charge related to this service. The patient expressed understanding and agreed to proceed.  Patient Location: Home Provider Location: [facility])  ---------------------------------------------------------------------- Chief Complaint  Patient presents with  . Hypothyroidism    S: Reviewed CMA documentation. I have called patient and gathered additional HPI as follows:   INSOMNIA / SHIFT WORK SLEEP DISORDER: Previous visit continued on Trazodone and Ambien PRN. Interval now currently out of work, she has shifted her schedule, no longer 2nd shift home midnight asleep by 4am, now she goes to bed by midnight, still taking Trazodone  nightly, tolerating well. - She took Ambien  PRN with good results, it helped her without causing hang over side effect or grogginess, currently out of medication. - overall she is more rested and doing better with her sleep.Marland Kitchen still waiting up overnight at times but it is improved - she prefers to do without any medication change, declines repeat ambien since not required for her at this time - Denies headache, pain keeping her awake   Major Depression, Recurrent, moderate, with anxiety Known history of depression and some mixed mood / anxiety. She did well on SSRI and Trazodone in past. She primarily has concern with insomnia. Previously did worse off medication, has improved back on SSRI medication with Trazodone. - Taking  Trazodone  nightly - See above history on insomnia - Denies agitation, panic attacks, suicidal or homicidal ideation, anxiety  HYPOTHYROIDISM: History of hyperthyroidism, s/p post operative radiation therapy, has been on chronic levothyroxine supplement therapy since, had followed  has been on fluctuating doses in past 125 to 150, no longer followed by Endocrinology, had done better in recent interval, last lab TSH range 13-25 in past 3-4 months. Now due for repeat lab but she was unable to get to [facility]. - Today reports still taking Levothyroxine daily before breakfast - She will go today to [facility] Denies temperature changes instability, edema, fatigue  Former Smoker / Tobacco Abuse Previously trial on Chantix back in 02/2018 - successful after 1 month pack dosing quit on 04/02/18. Has remained smoke free for 3 weeks now, doing well.   Patient is currently not working Denies any high risk travel to areas of current concern for COVID19. Denies any known or suspected exposure to person with or possibly with COVID19.  Denies any fevers, chills, sweats, body ache, cough, shortness of breath, sinus pain or pressure, headache, abdominal pain, diarrhea  Past Medical History:  Diagnosis Date  . Frequent headaches   . Hypothyroidism   . Leaky heart valve    Social History   Tobacco Use  . Smoking status: Former Smoker    Packs/day: 1.00    Types: Cigarettes    Last attempt to quit: 04/02/2018    Years since quitting: 0.0  . Smokeless tobacco: Former Engineer, water Use Topics  . Alcohol use: No  . Drug use: No    Current Outpatient Medications:  .  levothyroxine (SYNTHROID, LEVOTHROID) 150 MCG tablet, Take 1 tablet (150 mcg total)  by mouth daily before breakfast., Disp: 90 tablet, Rfl: 1 .  Multiple Vitamin (MULTIVITAMIN WITH MINERALS) TABS tablet, Take 1 tablet by mouth daily., Disp: , Rfl:  .  traZODone (DESYREL) 100 MG tablet, Take 1 tablet (100 mg total) by mouth at  bedtime. Start with half tablet (50mg ) for up to 1 week, then increase to 1 whole tablet, Disp: 30 tablet, Rfl: 5  [facility] 04/22/2018 04/22/2018 01/24/2018  Decreased Interest 0 0 2  Down, Depressed, Hopeless 0 0 2  PHQ - 2 Score 0 0 4  Altered sleeping 0 - 3  Tired, decreased energy 0 - 3  Change in appetite 0 - 3  Feeling bad or failure about yourself  0 - 2  Trouble concentrating 0 - 2  Moving slowly or fidgety/restless 0 - 3  Suicidal thoughts 0 - 0  PHQ-9 Score 0 - 20  Difficult doing work/chores Not difficult at all - Somewhat difficult    GAD 7 : Generalized Anxiety Score 04/22/2018 01/24/2018  Nervous, Anxious, on Edge 0 2  Control/stop worrying 0 3  Worry too much - different things 0 3  Trouble relaxing 0 3  Restless 0 3  Easily annoyed or irritable 0 2  Afraid - awful might happen 0 1  Total GAD 7 Score 0 17  Anxiety Difficulty Not difficult at all Somewhat difficult    Social History   Tobacco Use  . Smoking status: Former Smoker    Packs/day: 1.00    Types: Cigarettes    Last attempt to quit: 04/02/2018    Years since quitting: 0.0  . Smokeless tobacco: Former Engineer, waterUser  Substance Use Topics  . Alcohol use: No  . Drug use: No     -------------------------------------------------------------------------- O: No physical exam performed due to remote telephone encounter.  Lab results reviewed.  Recent Results (from the past 2160 hour(s))  Comprehensive metabolic panel     Status: Abnormal   Collection Time: 02/03/18 10:43 AM  Result Value Ref Range   Glucose 89 65 - 99 mg/dL   BUN 9 6 - 20 mg/dL   Creatinine, Ser 4.541.02 (H) 0.57 - 1.00 mg/dL   GFR calc non Af Amer 70 >59 mL/min/1.73   GFR calc Af Amer 81 >59 mL/min/1.73   BUN/Creatinine Ratio 9 9 - 23   Sodium 138 134 - 144 mmol/L   Potassium 4.6 3.5 - 5.2 mmol/L   Chloride 104 96 - 106 mmol/L   CO2 20 20 - 29 mmol/L   Calcium 9.7 8.7 - 10.2 mg/dL   Total Protein 7.6 6.0 - 8.5 g/dL    Albumin 4.4 3.8 - 4.8 g/dL    Comment:               **Please note reference interval change**   Globulin, Total 3.2 1.5 - 4.5 g/dL   Albumin/Globulin Ratio 1.4 1.2 - 2.2   Bilirubin Total 0.3 0.0 - 1.2 mg/dL   Alkaline Phosphatase 87 39 - 117 IU/L   AST 17 0 - 40 IU/L   ALT 11 0 - 32 IU/L  T4, free     Status: Abnormal   Collection Time: 02/03/18 10:43 AM  Result Value Ref Range   Free T4 0.71 (L) 0.82 - 1.77 ng/dL  TSH     Status: Abnormal   Collection Time: 02/03/18 10:43 AM  Result Value Ref Range   TSH 25.790 (H) 0.450 - 4.500 uIU/mL  VITAMIN D 25 Hydroxy (Vit-D Deficiency, Fractures)  Status: Abnormal   Collection Time: 02/03/18 10:43 AM  Result Value Ref Range   Vit D, 25-Hydroxy 14.4 (L) 30.0 - 100.0 ng/mL    Comment: Vitamin D deficiency has been defined by the Institute of Medicine and an Endocrine Society practice guideline as a level of serum 25-OH vitamin D less than 20 ng/mL (1,2). The Endocrine Society went on to further define vitamin D insufficiency as a level between 21 and 29 ng/mL (2). 1. IOM (Institute of Medicine). 2010. Dietary reference    intakes for calcium and D. [place]: The    [employer]. 2. Holick MF, Binkley [place], Bischoff-Ferrari HA, et al.    Evaluation, treatment, and prevention of vitamin D    deficiency: an Endocrine Society clinical practice    guideline. JCEM. 2011 Jul; 96(7):1911-30.     -------------------------------------------------------------------------- A&P:  Problem List Items Addressed This Visit    Hypothyroidism    Improved clinical hypothyroidism now on higher dose Levothyroxine - pending lab results Prior TSH was elevated 13-25 Failed prior Endocrinology [facility] - ineffective results on dose adjustments  Plan - DUE [facility] TSH, Free T4 today - orders in system, she will get labs - f/u result - For now pending result, continue Levothyroxine daily - will adjust accordingly - If suboptimal  control will have very low threshold to refer her to diff endocrine, she prefers [facility] location if need      Recurrent moderate major depressive disorder with anxiety (HCC) - Primary    Significantly improved mood and anxiety symptoms now on Trazodone and better sleep Secondary insomnia, 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   Follow-up: - Return in 3-6 months pending lab results, anticipate future orders again TSH   Patient verbalizes understanding with the above medical recommendations including the limitation of remote medical advice.  Specific follow-up and call-back criteria were given for patient to follow-up or seek medical care more urgently if needed.   - Time spent in direct consultation with patient on phone: 11 minutes  [name], DO [facility] 04/22/2018, 8:13 AM

## 2018-04-22 NOTE — Assessment & Plan Note (Signed)
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

## 2018-04-22 NOTE — Assessment & Plan Note (Addendum)
Improved clinical hypothyroidism now on higher dose Levothyroxine - pending lab results Prior TSH was elevated 13-25 Failed prior Endocrinology [facility] - ineffective results on dose adjustments  Plan - DUE [facility] TSH, Free T4 today - orders in system, she will get labs - f/u result - For now pending result, continue Levothyroxine daily - will adjust accordingly - If suboptimal control will have very low threshold to refer her to diff endocrine, she prefers [place] location if need

## 2018-04-22 NOTE — Assessment & Plan Note (Signed)
See A&P Insomnia, Mood Likely factor for her symptoms - now improved out of work temporarily, has shifted her sleep schedule slightly, still difficulty staying asleep

## 2018-04-23 LAB — T4, FREE: Free T4: 1.06 ng/dL (ref 0.82–1.77)

## 2018-04-23 LAB — TSH: TSH: 5.12 u[IU]/mL — ABNORMAL HIGH (ref 0.450–4.500)

## 2018-07-09 ENCOUNTER — Emergency Department (HOSPITAL_COMMUNITY): Payer: Medicaid Other

## 2018-07-09 ENCOUNTER — Emergency Department (HOSPITAL_COMMUNITY)
Admission: EM | Admit: 2018-07-09 | Discharge: 2018-07-09 | Disposition: A | Discharge status: Home / Self Care | Payer: Medicaid Other | Attending: Emergency Medicine | Admitting: Emergency Medicine

## 2018-07-09 ENCOUNTER — Other Ambulatory Visit: Payer: Self-pay

## 2018-07-09 DIAGNOSIS — E039 Hypothyroidism, unspecified: Secondary | ICD-10-CM | POA: Insufficient documentation

## 2018-07-09 DIAGNOSIS — Z87891 Personal history of nicotine dependence: Secondary | ICD-10-CM | POA: Diagnosis not present

## 2018-07-09 DIAGNOSIS — W1830XD Fall on same level, unspecified, subsequent encounter: Secondary | ICD-10-CM | POA: Diagnosis not present

## 2018-07-09 DIAGNOSIS — S6991XA Unspecified injury of right wrist, hand and finger(s), initial encounter: Secondary | ICD-10-CM | POA: Diagnosis not present

## 2018-07-09 DIAGNOSIS — W19XXXA Unspecified fall, initial encounter: Secondary | ICD-10-CM

## 2018-07-09 DIAGNOSIS — Z79899 Other long term (current) drug therapy: Secondary | ICD-10-CM | POA: Insufficient documentation

## 2018-07-09 DIAGNOSIS — M79605 Pain in left leg: Secondary | ICD-10-CM

## 2018-07-09 DIAGNOSIS — M25511 Pain in right shoulder: Secondary | ICD-10-CM | POA: Insufficient documentation

## 2018-07-09 DIAGNOSIS — M25562 Pain in left knee: Secondary | ICD-10-CM | POA: Diagnosis not present

## 2018-07-09 DIAGNOSIS — M546 Pain in thoracic spine: Secondary | ICD-10-CM | POA: Insufficient documentation

## 2018-07-09 DIAGNOSIS — S8992XA Unspecified injury of left lower leg, initial encounter: Secondary | ICD-10-CM | POA: Diagnosis not present

## 2018-07-09 DIAGNOSIS — G8921 Chronic pain due to trauma: Secondary | ICD-10-CM | POA: Diagnosis present

## 2018-07-09 DIAGNOSIS — M79662 Pain in left lower leg: Secondary | ICD-10-CM | POA: Diagnosis not present

## 2018-07-09 DIAGNOSIS — M25531 Pain in right wrist: Secondary | ICD-10-CM | POA: Diagnosis not present

## 2018-07-09 MED ORDER — NAPROXEN 500 MG PO TABS: 500.0000 mg | ORAL_TABLET | Freq: Two times a day (BID) | ORAL | 0 refills | Status: DC

## 2018-07-09 MED ORDER — KETOROLAC TROMETHAMINE 30 MG/ML IJ SOLN
30.0000 mg | Freq: Once | INTRAMUSCULAR | Status: AC
  Administered 2018-07-09: 20:00:00 30 mg via INTRAMUSCULAR
  Filled 2018-07-09: qty 1

## 2018-07-09 MED ORDER — METHOCARBAMOL 500 MG PO TABS: 500.0000 mg | ORAL_TABLET | Freq: Two times a day (BID) | ORAL | 0 refills | Status: DC

## 2018-07-09 NOTE — Discharge Instructions (Addendum)
Your x-ray showed no evidence of fracture today.  You will need to follow-up with orthopedics for further evaluation.  Please start taking naproxen twice daily for pain, you can use Robaxin as needed for pain and muscle spasm relief, this can cause drowsiness.  You can take over-the-counter Tylenol in addition to these medications and use over-the-counter salon pas lidocaine patches.  Ice and heat can also be used.  Follow-up with orthopedics if pain is not improving.

## 2018-07-09 NOTE — ED Notes (Signed)
Patient transported to X-ray 

## 2018-07-09 NOTE — ED Provider Notes (Signed)
[facility] Provider Note   CSN: [id number] Arrival date & time: 07/09/18  1752    History   Chief Complaint Chief Complaint  Patient presents with  . Fall    HPI Kari RougeJowanda S Wendel is a 38 y.o. female.     Kari Gallagher is a 38 y.o. female with a history of migraines, prediabetes, depression, who presents to the emergency department for evaluation after fall at work.  She reports the fall occurred on June 13, she fell forward landing on her outstretched right arm and left shin and knee.  She did not hit her head did not have any loss of consciousness.  Reports she was initially evaluated at [facility], did not have any x-rays done and reports pain is persisted.  She has been taking a muscle relaxer at home and intermittently using ice and heat, has not used over-the-counter pain medications or anything else to treat her pain and reports no improvement.  She reports right shoulder pain which is worse with movement she also has some pain in the right wrist.  No numbness or weakness.  She also reports pain with slight swelling and bruising over the left shin as well as some pain in the left knee, she is continued to be ambulatory although with some discomfort.  She also reports some pain in her neck on the right side as well as her mid back.  No numbness tingling or weakness in any extremities.  No pain over the chest or abdomen.  She has not followed up since being seen at [facility], and she has not seen an orthopedist.  No other aggravating or alleviating factors.     Past Medical History:  Diagnosis Date  . Frequent headaches   . Hypothyroidism   . Leaky heart valve     Patient Active Problem List   Diagnosis Date Noted  . Chest pain, atypical 03/26/2016  . Migraine with aura and with status migrainosus, not intractable 03/26/2016  . Peripheral edema 03/26/2016  . Pre-diabetes 01/14/2016  . Insomnia 12/07/2015  . Shift work sleep  disorder 12/07/2015  . Recurrent moderate major depressive disorder with anxiety (HCC) 10/03/2015  . Hypothyroidism 08/22/2015  . Vitamin D deficiency 08/22/2015    Past Surgical History:  Procedure Laterality Date  . CHOLECYSTECTOMY    . TUBAL LIGATION  2006     OB History   No obstetric history on file.      Home Medications    Prior to Admission medications   Medication Sig Start Date End Date Taking? Authorizing Provider  levothyroxine (SYNTHROID, LEVOTHROID) 150 MCG tablet Take 1 tablet (150 mcg total) by mouth daily before breakfast. 02/05/18   Karamalegos, Netta NeatAlexander J, DO  methocarbamol (ROBAXIN) 500 MG tablet Take 1 tablet (500 mg total) by mouth 2 (two) times daily. 07/09/18   Dartha LodgeFord, Shayda Kalka N, PA-C  Multiple Vitamin (MULTIVITAMIN WITH MINERALS) TABS tablet Take 1 tablet by mouth daily.    [provider]  naproxen (NAPROSYN) 500 MG tablet Take 1 tablet (500 mg total) by mouth 2 (two) times daily. 07/09/18   Dartha LodgeFord, Dominyk Law N, PA-C  traZODone (DESYREL) 100 MG tablet Take 1 tablet (100 mg total) by mouth at bedtime. Start with half tablet (50mg ) for up to 1 week, then increase to 1 whole tablet 01/24/18   Karamalegos, Netta NeatAlexander J, DO    Family History Family History  Problem Relation Age of Onset  . Stroke Mother   .  Depression Mother   . Mental illness Mother   . Heart disease Father   . Thyroid disease Father   . Lung cancer Father   . Thyroid disease Brother     Social History Social History   Tobacco Use  . Smoking status: Former Smoker    Packs/day: 1.00    Types: Cigarettes    Quit date: 04/02/2018    Years since quitting: 0.2  . Smokeless tobacco: Former Network engineer Use Topics  . Alcohol use: No  . Drug use: No     Allergies   Patient has no known allergies.   Review of Systems Review of Systems  Constitutional: Negative for chills and fever.  Respiratory: Negative for cough and shortness of breath.   Gastrointestinal: Negative for  abdominal pain, nausea and vomiting.  Musculoskeletal: Positive for arthralgias, back pain, myalgias and neck pain. Negative for joint swelling.  Skin: Negative for color change and rash.  Neurological: Negative for weakness and numbness.  All other systems reviewed and are negative.    Physical Exam Updated Vital Signs BP 122/82 (BP Location: Left Arm)   Pulse 86   Temp 99.5 F (37.5 C) (Oral)   Resp 16   SpO2 100%   Physical Exam Vitals signs and nursing note reviewed.  Constitutional:      General: She is not in acute distress.    Appearance: Normal appearance. She is well-developed and normal weight. She is not ill-appearing or diaphoretic.  HENT:     Head: Normocephalic and atraumatic.  Eyes:     General:        Right eye: No discharge.        Left eye: No discharge.  Neck:     Musculoskeletal: Neck supple.     Comments: Tenderness over the right-sided neck musculature but no midline neck tenderness. Cardiovascular:     Rate and Rhythm: Normal rate and regular rhythm.     Heart sounds: Normal heart sounds. No murmur. No friction rub. No gallop.   Pulmonary:     Effort: Pulmonary effort is normal. No respiratory distress.     Breath sounds: Normal breath sounds.     Comments: Respirations equal and unlabored, patient able to speak in full sentences, lungs clear to auscultation bilaterally, no tenderness over the chest wall. Chest:     Chest wall: No tenderness.  Abdominal:     General: Bowel sounds are normal. There is no distension.     Palpations: Abdomen is soft. There is no mass.     Tenderness: There is no abdominal tenderness. There is no guarding.     Comments: No tenderness over the abdomen.  Musculoskeletal:     Comments: Tenderness to palpation over the right-sided thoracic back musculature with slight midline tenderness but no palpable deformity.  No focal lumbar spine tenderness There is tenderness to palpation over the right shoulder without appreciable  swelling or deformity, no redness or warmth.  Active and passive range of motion intact with some pain.  There is also some pain over the right wrist.  2+ radial pulse, good capillary refill.  Normal strength and sensation. There is some tenderness to palpation over the left anterior shin, slight ecchymosis noted but no obvious bony deformity.  Some tenderness in the left knee as well although full range of motion intact.  2+ DP and TP pulses, good cap refill, normal sensation and strength. All other joints supple and easily movable, all compartments soft.  Skin:  General: Skin is warm and dry.  Neurological:     Mental Status: She is alert and oriented to person, place, and time.     Coordination: Coordination normal.  Psychiatric:        Mood and Affect: Mood normal.        Behavior: Behavior normal.      ED Treatments / Results  Labs (all labs ordered are listed, but only abnormal results are displayed) Labs Reviewed - No data to display  EKG None  Radiology Dg Thoracic Spine 2 View  Result Date: 07/09/2018 CLINICAL DATA:  Fall at work June 13. Persistent back pain. EXAM: THORACIC SPINE 2 VIEWS COMPARISON:  None. FINDINGS: The alignment is maintained. Vertebral body heights are maintained. No significant disc space narrowing. Posterior elements appear intact. No evidence of fracture. There is no paravertebral soft tissue abnormality. IMPRESSION: Negative radiographs of the thoracic spine. Electronically Signed   By: Narda RutherfordMelanie  Sanford M.D.   On: 07/09/2018 21:27   Dg Shoulder Right  Result Date: 07/09/2018 CLINICAL DATA:  Larey SeatFell 1 month ago with subsequent pain. EXAM: RIGHT SHOULDER - 2+ VIEW COMPARISON:  None. FINDINGS: There is no evidence of fracture or dislocation. There is no evidence of arthropathy or other focal bone abnormality. Soft tissues are unremarkable. IMPRESSION: Negative. Electronically Signed   By: Paulina FusiMark  Shogry M.D.   On: 07/09/2018 21:21   Dg Wrist Complete Right   Result Date: 07/09/2018 CLINICAL DATA:  Larey SeatFell 1 month ago with subsequent pain. EXAM: RIGHT WRIST - COMPLETE 3+ VIEW COMPARISON:  None. FINDINGS: There is no evidence of fracture or dislocation. There is no evidence of arthropathy or other focal bone abnormality. Soft tissues are unremarkable. IMPRESSION: Negative. Electronically Signed   By: Paulina FusiMark  Shogry M.D.   On: 07/09/2018 21:22   Dg Tibia/fibula Left  Result Date: 07/09/2018 CLINICAL DATA:  Left knee and lower extremity pain after fall at work June 13. Persistent pain. EXAM: LEFT TIBIA AND FIBULA - 2 VIEW COMPARISON:  None. FINDINGS: Cortical margins of the tibia and fibula are intact. There is no evidence of fracture or other focal bone lesions. Soft tissues are unremarkable. IMPRESSION: Negative radiographs of the left lower leg. Electronically Signed   By: Narda RutherfordMelanie  Sanford M.D.   On: 07/09/2018 21:25   Dg Knee Complete 4 Views Left  Result Date: 07/09/2018 CLINICAL DATA:  Left knee and lower extremity pain after fall at work June 13. Persistent pain. EXAM: LEFT KNEE - COMPLETE 4+ VIEW COMPARISON:  Radiograph 05/31/2016 FINDINGS: No evidence of fracture, dislocation, or joint effusion. Trace inferior patellar spurring, chronic and unchanged. No evidence of arthropathy or other focal bone abnormality. Soft tissues are unremarkable. IMPRESSION: No acute fracture or subluxation of the left knee. Electronically Signed   By: Narda RutherfordMelanie  Sanford M.D.   On: 07/09/2018 21:26    Procedures Procedures (including critical care time)  Medications Ordered in ED Medications  ketorolac (TORADOL) 30 MG/ML injection 30 mg (30 mg Intramuscular Given 07/09/18 2003)     Initial Impression / Assessment and Plan / ED Course  I have reviewed the triage vital signs and the nursing notes.  Pertinent labs & imaging results that were available during my care of the patient were reviewed by me and considered in my medical decision making (see chart for details).   Patient presents for evaluation of persistent pain after a fall on June 13, was initially evaluated at urgent care but no x-rays were done.  She did not hit her  head but has had persistent pain in her right shoulder, right upper back and neck, left shin and knee.  She has some mild swelling over the left shin but no other obvious deformities noted.  X-rays of the right shoulder, right wrist, left knee and tib/fib as well as thoracic spine ordered.  All x-rays show no evidence of acute fracture or bony abnormality.  Patient has been using muscle relaxers without any other medications to help treat pain.  Will have patient begin taking daily NSAID, as well as Robaxin and lidocaine patches.  Discussed with patient that if pain is still not improving she will need further evaluation with orthopedist.  Return precautions discussed.  Patient expresses understanding and agreement with this plan.  Discharged home in good condition.  Final Clinical Impressions(s) / ED Diagnoses   Final diagnoses:  Fall, initial encounter  Acute pain of right shoulder  Left leg pain  Acute right-sided thoracic back pain    ED Discharge Orders         Ordered    naproxen (NAPROSYN) 500 MG tablet  2 times daily     07/09/18 2142    methocarbamol (ROBAXIN) 500 MG tablet  2 times daily     07/09/18 2142           Legrand RamsFord, Allani Reber N, PA-C 07/09/18 2329    Alvira MondaySchlossman, Erin, MD 07/11/18 2036

## 2018-07-09 NOTE — ED Triage Notes (Addendum)
Pt states she had a fall on June the 13th at work and since then she has been having left leg pain/ right arm , lower back and neck pain ;

## 2018-07-15 DIAGNOSIS — E034 Atrophy of thyroid (acquired): Secondary | ICD-10-CM

## 2018-07-15 DIAGNOSIS — E559 Vitamin D deficiency, unspecified: Secondary | ICD-10-CM

## 2018-07-16 ENCOUNTER — Other Ambulatory Visit: Payer: Self-pay

## 2018-07-16 ENCOUNTER — Ambulatory Visit (INDEPENDENT_AMBULATORY_CARE_PROVIDER_SITE_OTHER): Payer: Medicaid Other | Admitting: Family Medicine

## 2018-07-16 ENCOUNTER — Encounter: Payer: Self-pay | Admitting: Family Medicine

## 2018-07-16 DIAGNOSIS — E034 Atrophy of thyroid (acquired): Secondary | ICD-10-CM

## 2018-07-16 DIAGNOSIS — G4701 Insomnia due to medical condition: Secondary | ICD-10-CM | POA: Diagnosis not present

## 2018-07-16 DIAGNOSIS — F419 Anxiety disorder, unspecified: Secondary | ICD-10-CM | POA: Diagnosis not present

## 2018-07-16 DIAGNOSIS — Z124 Encounter for screening for malignant neoplasm of cervix: Secondary | ICD-10-CM | POA: Diagnosis not present

## 2018-07-16 DIAGNOSIS — F331 Major depressive disorder, recurrent, moderate: Secondary | ICD-10-CM | POA: Diagnosis not present

## 2018-07-16 DIAGNOSIS — G4726 Circadian rhythm sleep disorder, shift work type: Secondary | ICD-10-CM

## 2018-07-16 MED ORDER — SERTRALINE HCL 50 MG PO TABS: 50.0000 mg | ORAL_TABLET | Freq: Every day | ORAL | 2 refills | Status: DC

## 2018-07-16 NOTE — Patient Instructions (Addendum)
Start back on Sertraline 50mg  daily in future may need to increase up to 100mg  in future. Let me know  New referral to sent GYN - they will try to locate a specialist in [place].  Follow with your company doctor and the PT and testing, they will need to manage this for worker's comp, I cannot get involved.  Thyroid labs ordered, [facility]  Please schedule a Follow-up Appointment to: Return in about 4 weeks (around 08/13/2018), or if symptoms worsen or fail to improve, for depression, insomnia, thyroid.  If you have any other questions or concerns, please feel free to call the office or send a message through [facility]. You may also schedule an earlier appointment if necessary.  Additionally, you may be receiving a survey about your experience at our office within a few days to 1 week by e-mail or mail. We value your feedback.  Nobie Putnam, DO [place]

## 2018-07-16 NOTE — Progress Notes (Signed)
Virtual Visit via Telephone The purpose of this virtual visit is to provide medical care while limiting exposure to the novel coronavirus (COVID19) for both patient and office staff.  Consent was obtained for phone visit:  Yes.   Answered questions that patient had about telehealth interaction:  Yes.   I discussed the limitations, risks, security and privacy concerns of performing an evaluation and management service by telephone. I also discussed with the patient that there may be a patient responsible charge related to this service. The patient expressed understanding and agreed to proceed.  Patient Location: Home [facility]  ---------------------------------------------------------------------- Chief Complaint  Patient presents with  . Hypothyroidism    follow up     S: Reviewed CMA documentation. I have called patient and gathered additional HPI as follows:  INSOMNIA / SHIFT WORK SLEEP DISORDER: Last visit 04/2018, see note for updates She was on trazodone, she stopped recently due to injury and other meds see below No changes Now out of work due to injury - Denies headache, pain keeping her awake  Major Depression, Recurrent, moderate, with anxiety - Last visit with me 04/2018, discussed this in 01/2018, previously treated with sertraline up to 100mg  and on trazodone, see prior notes for background information. - Interval update with had tapered off sertraline in past didn't need anymore - Today patient reports now worsening mood and anxiety due to coronavirus pandemic, also out of work and other stressors - interested to restart Sertraline - Taking Trazodone 100mg  nightly - See above history on insomnia - Denies agitation, panic attacks, suicidal or homicidal ideation, anxiety  HYPOTHYROIDISM: History of hyperthyroidism, s/p post operative radiation therapy, has been on chronic levothyroxine supplement therapy since, had followed   has been on fluctuating doses in past 125 to 150, no longer followed by Endocrinology - Last lab TSH 5.1 04/2018, due for labs now at [facility] - Today reports still taking Levothyroxine 150mcg daily before breakfast Denies temperature changes instability, edema, fatigue  Additionally - Elbow injury, she is followed by employer's physician, [facility]o PT and MRI. She was taken out of work, cannot lift >5 lbs, avoiding prolong walking. - Works at [employer] - She is asking about work documentation, it sounds like she does have worker's comp for this injury   Denies any high risk travel to areas of current concern for COVID19. Denies any known or suspected exposure to person with or possibly with COVID19.  Denies any fevers, chills, sweats, body ache, cough, shortness of breath, sinus pain or pressure, headache, abdominal pain, diarrhea  Past Medical History:  Diagnosis Date  . Frequent headaches   . Hypothyroidism   . Leaky heart valve    Social History   Tobacco Use  . Smoking status: Former Smoker    Packs/day: 1.00    Types: Cigarettes    Quit date: 04/02/2018    Years since quitting: 0.2  . Smokeless tobacco: Former Engineer, water[employer]  Substance Use Topics  . Alcohol use: No  . Drug use: No    Current Outpatient Medications:  .  levothyroxine (SYNTHROID, LEVOTHROID) 150 MCG tablet, Take 1 tablet (150 mcg total) by mouth daily before breakfast., Disp: 90 tablet, Rfl: 1 .  Multiple Vitamin (MULTIVITAMIN WITH MINERALS) TABS tablet, Take 1 tablet by mouth daily., Disp: , Rfl:  .  traZODone (DESYREL) 100 MG tablet, Take 1 tablet (100 mg total) by mouth at bedtime. Start with half tablet (50mg ) for  up to 1 week, then increase to 1 whole tablet, Disp: 30 tablet, Rfl: 5 .  methocarbamol (ROBAXIN) 500 MG tablet, Take 1 tablet (500 mg total) by mouth 2 (two) times daily. (Patient not taking: Reported on 07/16/2018), Disp: 20 tablet, Rfl: 0 .  naproxen (NAPROSYN) 500 MG  tablet, Take 1 tablet (500 mg total) by mouth 2 (two) times daily. (Patient not taking: Reported on 07/16/2018), Disp: 30 tablet, Rfl: 0 .  sertraline (ZOLOFT) 50 MG tablet, Take 1 tablet (50 mg total) by mouth daily. May increase up to 100mg  in 2-4 weeks, Disp: 30 tablet, Rfl: 2  [facility] 07/16/2018 04/22/2018 04/22/2018  Decreased Interest 2 0 0  Down, Depressed, Hopeless 2 0 0  PHQ - 2 Score 4 0 0  Altered sleeping 3 0 -  Tired, decreased energy 3 0 -  Change in appetite 3 0 -  Feeling bad or failure about yourself  2 0 -  Trouble concentrating 1 0 -  Moving slowly or fidgety/restless 3 0 -  Suicidal thoughts 0 0 -  PHQ-9 Score 19 0 -  Difficult doing work/chores Not difficult at all Not difficult at all -    GAD 7 : Generalized Anxiety Score 07/16/2018 04/22/2018 01/24/2018  Nervous, Anxious, on Edge 3 0 2  Control/stop worrying 3 0 3  Worry too much - different things 3 0 3  Trouble relaxing 3 0 3  Restless 0 0 3  Easily annoyed or irritable 3 0 2  Afraid - awful might happen 3 0 1  Total GAD 7 Score 18 0 17  Anxiety Difficulty Not difficult at all Not difficult at all Somewhat difficult    -------------------------------------------------------------------------- O: No physical exam performed due to remote telephone encounter.  Lab results reviewed.  Recent Results (from the past 2160 hour(s))  T4, free     Status: None   Collection Time: 04/22/18  9:43 AM  Result Value Ref Range   Free T4 1.06 0.82 - 1.77 ng/dL  TSH     Status: Abnormal   Collection Time: 04/22/18  9:43 AM  Result Value Ref Range   TSH 5.120 (H) 0.450 - 4.500 uIU/mL    -------------------------------------------------------------------------- A&P:  Problem List Items Addressed This Visit    Hypothyroidism Previously improved Difficult to control Continue current dose for now levothyroxine Ordered TSH Free T4, go to [facility], f/u results    Insomnia She should restart trazodone  as discussed    Recurrent moderate major depressive disorder with anxiety (HCC) - Primary Significant mood and anxiety worsening now Worse in coronavirus pandemic  Restart Sertraline 50mg  daily for now, 4 weeks can increase to 100 F/u    Relevant Medications   sertraline (ZOLOFT) 50 MG tablet   Shift work sleep disorder    Other Visit Diagnoses    Cervical cancer screening       Relevant Orders   Ambulatory referral to Gynecology    [facility]. Last unable to schedule w/ her in 01/2018 and she needed [place] not [place].  Regarding elbow injury she needs to go through [employer], they are already proceeding with imaging MRI and PT and already took her out of work.    Meds ordered this encounter  Medications  . sertraline (ZOLOFT) 50 MG tablet    Sig: Take 1 tablet (50 mg total) by mouth daily. May increase up to 100mg  in 2-4 weeks    Dispense:  30 tablet  Refill:  2    Follow-up: - Return in 4-6 weeks depression, insomnia, thyroid  Patient verbalizes understanding with the above medical recommendations including the limitation of remote medical advice.  Specific follow-up and call-back criteria were given for patient to follow-up or seek medical care more urgently if needed.   - Time spent in direct consultation with patient on phone: 15 minutes   Nobie Putnam, [facility] 07/16/2018, 1:36 PM

## 2018-07-25 DIAGNOSIS — E034 Atrophy of thyroid (acquired): Secondary | ICD-10-CM | POA: Diagnosis not present

## 2018-07-25 DIAGNOSIS — E559 Vitamin D deficiency, unspecified: Secondary | ICD-10-CM | POA: Diagnosis not present

## 2018-07-26 LAB — T4, FREE: Free T4: 1.14 ng/dL (ref 0.82–1.77)

## 2018-07-26 LAB — TSH: TSH: 7.34 u[IU]/mL — ABNORMAL HIGH (ref 0.450–4.500)

## 2018-07-26 LAB — VITAMIN D 25 HYDROXY (VIT D DEFICIENCY, FRACTURES): Vit D, 25-Hydroxy: 20.5 ng/mL — ABNORMAL LOW (ref 30.0–100.0)

## 2018-07-29 ENCOUNTER — Other Ambulatory Visit: Payer: Self-pay | Admitting: Family Medicine

## 2018-07-29 DIAGNOSIS — E034 Atrophy of thyroid (acquired): Secondary | ICD-10-CM

## 2018-07-29 DIAGNOSIS — M25521 Pain in right elbow: Secondary | ICD-10-CM

## 2018-07-29 MED ORDER — LEVOTHYROXINE SODIUM 175 MCG PO TABS: 175.0000 ug | ORAL_TABLET | Freq: Every day | ORAL | 2 refills | Status: DC

## 2018-08-01 ENCOUNTER — Encounter: Payer: Self-pay | Admitting: *Deleted

## 2018-08-11 DIAGNOSIS — M542 Cervicalgia: Secondary | ICD-10-CM | POA: Insufficient documentation

## 2018-08-13 ENCOUNTER — Encounter: Payer: Medicaid Other | Admitting: Medical

## 2018-08-19 DIAGNOSIS — M542 Cervicalgia: Secondary | ICD-10-CM | POA: Diagnosis not present

## 2018-08-19 DIAGNOSIS — S134XXA Sprain of ligaments of cervical spine, initial encounter: Secondary | ICD-10-CM | POA: Diagnosis not present

## 2018-08-21 ENCOUNTER — Telehealth: Payer: Self-pay | Admitting: Obstetrics and Gynecology

## 2018-08-21 NOTE — Telephone Encounter (Signed)
Called the patient to inform of the upcoming visit. The patient stated she started her cycle and has to reschedule the appointment.

## 2018-08-22 ENCOUNTER — Encounter: Payer: Medicaid Other | Admitting: Medical

## 2018-08-27 ENCOUNTER — Other Ambulatory Visit: Payer: Self-pay

## 2018-08-27 ENCOUNTER — Encounter: Payer: Self-pay | Admitting: Physical Therapy

## 2018-08-27 ENCOUNTER — Ambulatory Visit: Payer: Medicaid Other | Attending: Orthopedic Surgery | Admitting: Physical Therapy

## 2018-08-27 DIAGNOSIS — M25511 Pain in right shoulder: Secondary | ICD-10-CM | POA: Diagnosis not present

## 2018-08-27 DIAGNOSIS — M542 Cervicalgia: Secondary | ICD-10-CM | POA: Diagnosis not present

## 2018-08-27 NOTE — Therapy (Signed)
[facility] [address] [place], [place], [address] Phone: [phone]   Fax:  [phone]  Physical Therapy Evaluation  Patient Details  Name: Kari Gallagher MRN: [id number] Date of Birth: 03/23/1980 Referring Provider (PT): Duwayne HeckJason Rogers   Encounter Date: 08/27/2018  PT End of Session - 08/27/18 1245    Visit Number  1    Number of Visits  4    Date for PT Re-Evaluation  10/08/18    PT Start Time  0800    PT Stop Time  0830    PT Time Calculation (min)  30 min    Activity Tolerance  Patient tolerated treatment well    Behavior During Therapy  [facility] for tasks assessed/performed       Past Medical History:  Diagnosis Date  . Frequent headaches   . Hypothyroidism   . Leaky heart valve     Past Surgical History:  Procedure Laterality Date  . CHOLECYSTECTOMY    . TUBAL LIGATION  2006    There were no vitals filed for this visit.   Subjective Assessment - 08/27/18 0805    Subjective  Pt had incident at work, in June. Was pushing heavy cart that fell into a crack in the floor, and she fell. Hurt L knee (better now) and R neck/shoulder region. She did not notice pain at first, but neck started hurting later on. She was seen for L knee in PT, which was workers comp. She no longer works at that job, She is not working at [employer] time,may go back to different job. She has most pain in her R cervical area, and into R shoulder blade region.    Limitations  Lifting;Writing;House hold activities    Diagnostic tests  Neg thoracic X-ray    Currently in Pain?  Yes    Pain Score  5     Pain Location  Neck    Pain Orientation  Right;Left    Pain Descriptors / Indicators  Aching;Tightness    Pain Type  Acute pain    Pain Onset  More than a month ago    Pain Frequency  Intermittent    Aggravating Factors   at end of the day, night time, movement    Multiple Pain Sites  Yes    Pain Score  5    Pain Location  Shoulder    Pain  Orientation  Right;Posterior    Pain Descriptors / Indicators  Aching;Tightness    Pain Type  Acute pain    Pain Onset  More than a month ago    Pain Frequency  Intermittent    Aggravating Factors   Reaching, arm movement         [facility] PT Assessment - 08/27/18 0001      Assessment   Medical Diagnosis  Cervical Spine Strain    Referring Provider (PT)  Duwayne HeckJason Rogers    Onset Date/Surgical Date  06/02/18    Prior Therapy  no      Balance Screen   Has the patient fallen in the past 6 months  Yes    How many times?  1    Has the patient had a decrease in activity level because of a fear of falling?   Yes    Is the patient reluctant to leave their home because of a fear of falling?   No      Prior Function   Level of Independence  Independent  Cognition   Overall Cognitive Status  Within Functional Limits for tasks assessed      Posture/Postural Control   Posture Comments  mild/flexible, forward head, rounded shoulders.       ROM / Strength   AROM / PROM / Strength  AROM;Strength      AROM   Overall AROM Comments  Shoulder: mild limitation for elevation on R;due to pain    AROM Assessment Site  Cervical    Cervical Flexion  mild limitation    Cervical Extension  WNL    Cervical - Right Rotation  68    Cervical - Left Rotation  65      Strength   Overall Strength Comments  R shoulder: 4-/5, Scapular: 4-/5,       Palpation   Palpation comment  Soreness at bil sub occipital region, R cervical paraspinals, R UT, R medial scap border and rhomboid.  Tightness and trigger points in R UT vs L., rhomboid and infraspinatus/teres region       Special Tests   Other special tests  Neg ULTT, Increased pain with shoulder empty can and obriens (but pain in scapular region vs GHJ)                 Objective measurements completed on examination: See above findings.      [place] Adult PT Treatment/Exercise - 08/27/18 0001      Exercises   Exercises  Neck      Neck  Exercises: Seated   Neck Retraction  10 reps    Neck Retraction Limitations  increased pain in levator region with retractions     Shoulder Rolls  10 reps    Other Seated Exercise  Scap retraction x10;       Neck Exercises: Stretches   Upper Trapezius Stretch  30 seconds;2 reps;Right;Left             PT Education - 08/27/18 1245    Education Details  PT POC, HEP, Exam findings.    Person(s) Educated  Patient    Methods  Explanation;Demonstration;Tactile cues;Verbal cues    Comprehension  Verbalized understanding;Returned demonstration;Verbal cues required;Need further instruction       PT Short Term Goals - 08/27/18 1249      PT SHORT TERM GOAL #1   Title  Pt to report decreased pain in cervical region to 3/10 with activity    Baseline  Pain 5/10    Time  2    Period  Weeks    Status  New    Target Date  09/10/18        PT Long Term Goals - 08/27/18 1251      PT LONG TERM GOAL #1   Title  Pt to be independent with final HEP    Baseline  No HEP    Time  6    Period  Weeks    Status  New    Target Date  10/08/18      PT LONG TERM GOAL #2   Title  Pt to demo improved AROM of neck and R shoulder, to be WNL, for improved ability for ADLS    Baseline  Mild limtations for neck and shoulder, with increased pain.    Time  6    Period  Weeks    Status  New    Target Date  10/08/18      PT LONG TERM GOAL #3   Title  Pt to report decreased pain in cervical/shoulder  region to 0-2/10 with head turns, reaching, and lifting, to improve ability for return to work duties.    Baseline  Pain 5/10    Time  6    Period  Weeks    Status  New    Target Date  10/08/18      PT LONG TERM GOAL #4   Title  Pt to demo soft tissue restrictions to be WNL for R cervical and posterior shoulder region, to improve pain and function.    Baseline  Increased tightness and soreness in R UT, cervical, and posterior shoulder.    Time  6    Period  Weeks    Status  New    Target Date   10/08/18             Plan - 08/27/18 1245    Clinical Impression Statement  Pt presents with primary complaint of increased pain in neck and R upper back region. She had intial injury, with fall at work in June. She has mild ROM loss of cervical rotaion, as well as full UE elevation, due to pain. She has pain, tightness and trigger points in most of cervical and R upper back/shoulder musculature. Pt would be good candidate for manual and/or dry needling for muscle tightness.  She has mild decrease strength of R UE, and decreased ability for reaching, lift, carry and IADLS due to pain. Pt with decreased ability for full functional activites at this time. Pt to benefit from skilled PT to improve deficits and return to PLOF.    Examination-Activity Limitations  Locomotion Level;Reach Overhead;Carry;Sleep;Lift    Examination-Participation Restrictions  Cleaning;Community Activity;Shop;Laundry    Stability/Clinical Decision Making  Stable/Uncomplicated    Clinical Decision Making  Low    Rehab Potential  Good    PT Frequency  1x / week    PT Duration  4 weeks    PT Treatment/Interventions  ADLs/Self Care Home Management;Cryotherapy;Electrical Stimulation;Ultrasound;Traction;Moist Heat;Iontophoresis 4mg /ml Dexamethasone;Functional mobility training;Therapeutic activities;Therapeutic exercise;Neuromuscular re-education;Manual techniques;Patient/family education;Passive range of motion;Dry needling;Joint Manipulations;Spinal Manipulations;Taping    PT Next Visit Plan  Manual and/or DN for Neck, UT, posterior shoulder,  posture, scapular and shoulder strength.    PT Home Exercise Plan  [email or web address]    Consulted and Agree with Plan of Care  Patient       Patient will benefit from skilled therapeutic intervention in order to improve the following deficits and impairments:  Decreased range of motion, Decreased endurance, Increased muscle spasms, Impaired UE functional use, Pain, Hypomobility, Impaired  flexibility, Improper body mechanics, Decreased strength, Decreased activity tolerance  Visit Diagnosis: Cervicalgia  Acute pain of right shoulder     Problem List Patient Active Problem List   Diagnosis Date Noted  . Chest pain, atypical 03/26/2016  . Migraine with aura and with status migrainosus, not intractable 03/26/2016  . Peripheral edema 03/26/2016  . Pre-diabetes 01/14/2016  . Insomnia 12/07/2015  . 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 Recurrent moderate major depressive disorder with anxiety (HCC) 10/03/2015  . Hypothyroidism 08/22/2015  . Vitamin D deficiency 08/22/2015    Sedalia Muta, PT, DPT 2:15 PM  08/27/18    [facility] [address] [place], [place], [address] Phone: [phone]   Fax:  [phone]  Name: Kari Gallagher MRN: [id number] Date of Birth: [birth year]

## 2018-08-27 NOTE — Patient Instructions (Signed)
Access Code: [email or web address]  URL: [email or web address]  Date: 08/27/2018  Prepared by: Lyndee Hensen   Exercises Seated Scapular Retraction - 10 reps - 2 sets - 2x daily Standing Backward Shoulder Rolls - 10 reps - 2 sets - 2x daily Seated Cervical Retraction - 10 reps - 2 sets - 2x daily Seated Upper Trapezius Stretch - 3 reps - 30 hold - 2x daily

## 2018-09-05 ENCOUNTER — Ambulatory Visit: Payer: Medicaid Other | Attending: Orthopedic Surgery | Admitting: Physical Therapy

## 2018-09-05 ENCOUNTER — Other Ambulatory Visit: Payer: Self-pay

## 2018-09-05 DIAGNOSIS — M25511 Pain in right shoulder: Secondary | ICD-10-CM | POA: Diagnosis present

## 2018-09-05 DIAGNOSIS — M542 Cervicalgia: Secondary | ICD-10-CM

## 2018-09-05 NOTE — Therapy (Signed)
[facility] [address] [place], [place], [address] Phone: [phone]   Fax:  [phone]  Physical Therapy Treatment  Patient Details  Name: Kari Gallagher MRN: [id number] Date of Birth: 05/15/1980 Referring Provider (PT): Duwayne HeckJason Rogers   Encounter Date: 09/05/2018  PT End of Session - 09/05/18 0932    Visit Number  2    Number of Visits  4    Date for PT Re-Evaluation  10/08/18    PT Start Time  0818   15 minutes late and then not paged by the front   PT Stop Time  0915    PT Time Calculation (min)  57 min    Activity Tolerance  Patient tolerated treatment well    Behavior During Therapy  [facility] for tasks assessed/performed       Past Medical History:  Diagnosis Date  . Frequent headaches   . Hypothyroidism   . Leaky heart valve     Past Surgical History:  Procedure Laterality Date  . CHOLECYSTECTOMY    . TUBAL LIGATION  2006    There were no vitals filed for this visit.  Subjective Assessment - 09/05/18 0835    Subjective  Patients pain is a little better.    Limitations  Lifting;Writing;House hold activities    Currently in Pain?  Yes    Pain Score  5     Pain Location  Neck    Pain Orientation  Right;Left    Pain Descriptors / Indicators  Aching    Pain Type  Chronic pain    Pain Onset  More than a month ago    Pain Frequency  Intermittent    Aggravating Factors   night time, movement    Pain Relieving Factors  rest    Effect of Pain on Daily Activities  difficulty perfroming ADL's    Multiple Pain Sites  Yes    Pain Score  5    Pain Location  Scapula    Pain Orientation  Right    Pain Descriptors / Indicators  Aching    Pain Type  Chronic pain    Pain Onset  More than a month ago    Aggravating Factors   use of her arm                       [facility] Adult PT Treatment/Exercise - 09/05/18 0001      Self-Care   Self-Care  Other Self-Care Comments    Other Self-Care Comments    reviewed sleep hygiene. Patien pt reports she has not been able to sleep. She was given a list of 14 tips. She was advised to pick 2 and try them. Therapy also reviewed the use of a posture strap for work. She was adivsed to try not to use it all the time but she can use it if she needs it. Therapy also reviewed what to expect after needling and how to prevent post needle soreness.       Neck Exercises: Theraband   Scapula Retraction  10 reps;Red    Scapula Retraction Limitations  shown how ro do at home     Shoulder Extension  20 reps    Shoulder Extension Limitations  2x10 shown how to secure in the door       Modalities   Modalities  Moist Heat      Moist Heat Therapy   Number Minutes Moist Heat  10 Minutes  Moist Heat Location  Cervical      Manual Therapy   Manual Therapy  Soft tissue mobilization;Myofascial release;Manual Traction    Manual therapy comments  skilled palpation of trigger points     Soft tissue mobilization  trigger point release to the upper traps    Manual Traction  gentle to cervical spine; sub-occipital release       Neck Exercises: Stretches   Upper Trapezius Stretch  30 seconds;2 reps;Right;Left    Levator Stretch  3 reps;30 seconds       Trigger Point Dry Needling - 09/05/18 0001    Consent Given?  Yes    Education Handout Provided  Yes    Muscles Treated Head and Neck  Upper trapezius;Cervical multifidi    Other Dry Needling  30x40 needle 1 to cervical multifidi 2 to upper trap    Upper Trapezius Response  Twitch reponse elicited    Cervical multifidi Response  Twitch reponse elicited           PT Education - 09/05/18 0930    Education Details  reviewed HEP and symptom management    Person(s) Educated  Patient    Methods  Explanation;Demonstration;Verbal cues;Tactile cues    Comprehension  Verbalized understanding;Returned demonstration;Verbal cues required;Tactile cues required       PT Short Term Goals - 08/27/18 1249      PT SHORT  TERM GOAL #1   Title  Pt to report decreased pain in cervical region to 3/10 with activity    Baseline  Pain 5/10    Time  2    Period  Weeks    Status  New    Target Date  09/10/18        PT Long Term Goals - 08/27/18 1251      PT LONG TERM GOAL #1   Title  Pt to be independent with final HEP    Baseline  No HEP    Time  6    Period  Weeks    Status  New    Target Date  10/08/18      PT LONG TERM GOAL #2   Title  Pt to demo improved AROM of neck and R shoulder, to be WNL, for improved ability for ADLS    Baseline  Mild limtations for neck and shoulder, with increased pain.    Time  6    Period  Weeks    Status  New    Target Date  10/08/18      PT LONG TERM GOAL #3   Title  Pt to report decreased pain in cervical/shoulder region to 0-2/10 with head turns, reaching, and lifting, to improve ability for return to work duties.    Baseline  Pain 5/10    Time  6    Period  Weeks    Status  New    Target Date  10/08/18      PT LONG TERM GOAL #4   Title  Pt to demo soft tissue restrictions to be WNL for R cervical and posterior shoulder region, to improve pain and function.    Baseline  Increased tightness and soreness in R UT, cervical, and posterior shoulder.    Time  6    Period  Weeks    Status  New    Target Date  10/08/18            Plan - 09/05/18 0934    Clinical Impression Statement  Patient tolerated dry needling  well. She had a good twitch in her upper trap and her cervical paraspinals. She was also given scpaular strengthening exercises with resistance for home. She was adivsed to replace her non-resistewd exercises if tolerated. She was shown a levaotr stretch 2nd to reported levaotr area pain. She was also educated on sleep hygiene. She is not sleeping which is causing her stress. She felt improved pain with manual therapy.    Examination-Activity Limitations  Locomotion Level;Reach Overhead;Carry;Sleep;Lift    Examination-Participation Restrictions   Cleaning;Community Activity;Shop;Laundry    Stability/Clinical Decision Making  Stable/Uncomplicated    Clinical Decision Making  Low    Rehab Potential  Good    PT Frequency  1x / week    PT Duration  4 weeks    PT Treatment/Interventions  ADLs/Self Care Home Management;Cryotherapy;Electrical Stimulation;Ultrasound;Traction;Moist Heat;Iontophoresis 4mg /ml Dexamethasone;Functional mobility training;Therapeutic activities;Therapeutic exercise;Neuromuscular re-education;Manual techniques;Patient/family education;Passive range of motion;Dry needling;Joint Manipulations;Spinal Manipulations;Taping    PT Next Visit Plan  Manual and/or DN for Neck, UT, posterior shoulder,  posture, scapular and shoulder strength. Consdier needling peri-scpaular area and levator ; continue with postural exercises. Consdier supine progression    PT Home Exercise Plan  [id number] scap retractions, shoulder extensions, levator stretch    Consulted and Agree with Plan of Care  Patient       Patient will benefit from skilled therapeutic intervention in order to improve the following deficits and impairments:  Decreased range of motion, Decreased endurance, Increased muscle spasms, Impaired UE functional use, Pain, Hypomobility, Impaired flexibility, Improper body mechanics, Decreased strength, Decreased activity tolerance  Visit Diagnosis: Cervicalgia  Acute pain of right shoulder     Problem List Patient Active Problem List   Diagnosis Date Noted  . Chest pain, atypical 03/26/2016  . Migraine with aura and with status migrainosus, not intractable 03/26/2016  . Peripheral edema 03/26/2016  . Pre-diabetes 01/14/2016  . Insomnia 12/07/2015  . 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 Recurrent moderate major depressive disorder with anxiety ([place]) 10/03/2015  . Hypothyroidism 08/22/2015  . Vitamin D deficiency 08/22/2015    Carney Living PT DPT  09/05/2018, 9:41 AM  [facility] [address] [place], [place], [address] Phone: [phone]   Fax:  [phone]  Name: Kari Gallagher MRN: [id number] Date of Birth: Dec 19, 1980

## 2018-09-12 ENCOUNTER — Ambulatory Visit: Payer: Medicaid Other | Admitting: Physical Therapy

## 2018-09-12 ENCOUNTER — Encounter: Payer: Self-pay | Admitting: Physical Therapy

## 2018-09-12 ENCOUNTER — Other Ambulatory Visit: Payer: Self-pay

## 2018-09-12 DIAGNOSIS — M542 Cervicalgia: Secondary | ICD-10-CM | POA: Diagnosis not present

## 2018-09-12 DIAGNOSIS — M25511 Pain in right shoulder: Secondary | ICD-10-CM

## 2018-09-12 NOTE — Therapy (Signed)
[place], [place], [address] Phone: [phone]   Fax:  [phone]  Physical Therapy Treatment  Patient Details  Name: Kari Gallagher MRN: [id number] Date of Birth: [birth year] Referring Provider (PT): Victorino December   Encounter Date: 09/12/2018  PT End of Session - 09/12/18 1223    Visit Number  3    Number of Visits  4    Date for PT Re-Evaluation  10/08/18    PT Start Time  0811   Patient 11 minutes late   PT Stop Time  0846    PT Time Calculation (min)  35 min    Activity Tolerance  Patient tolerated treatment well    Behavior During Therapy  [facility] for tasks assessed/performed       Past Medical History:  Diagnosis Date  . Frequent headaches   . Hypothyroidism   . Leaky heart valve     Past Surgical History:  Procedure Laterality Date  . CHOLECYSTECTOMY    . TUBAL LIGATION  2006    There were no vitals filed for this visit.  Subjective Assessment - 09/12/18 0816    Subjective  Patiet was 11 minutes late agian. Therapy advised her of her attendance policy. She apologized and is aware. She feels like her pain is a little better but she is still having pain around her levator and peri-scpaular area. She flet like the needling helepd.    Limitations  Lifting;Writing;House hold activities    Currently in Pain?  Yes    Pain Score  4     Pain Location  Neck    Pain Orientation  Right;Left    Pain Descriptors / Indicators  Aching    Pain Type  Chronic pain    Pain Onset  More than a month ago    Pain Frequency  Intermittent    Aggravating Factors   night time and with movement    Pain Relieving Factors  rest    Effect of Pain on Daily Activities  difficulty perfroming ADL's                       [facility] Adult PT Treatment/Exercise - 09/12/18 0001      Manual Therapy   Manual Therapy  Soft tissue mobilization;Myofascial release;Manual Traction    Manual therapy comments   skilled palpation of trigger points     Soft tissue mobilization  trigger point release to the upper traps    Manual Traction  gentle to cervical spine; sub-occipital release       Neck Exercises: Stretches   Upper Trapezius Stretch  30 seconds;2 reps;Right;Left    Levator Stretch  3 reps;30 seconds       Trigger Point Dry Needling - 09/12/18 0001    Consent Given?  Yes    Education Handout Provided  Yes    Muscles Treated Head and Neck  Upper trapezius;Cervical multifidi;Levator scapulae    Muscles Treated Upper Quadrant  Subscapularis    Other Dry Needling  30x40 levator done with shelfing techniqure. percautions over the rib cage described to patient.     Levator Scapulae Response  Twitch response elicited    Cervical multifidi Response  Twitch reponse elicited    Subscapularis Response  Twitch response elicited           PT Education - 09/12/18 0827    Education Details  reviewed risks dry needling over the rib field    Person(s)  Educated  Patient    Methods  Explanation;Demonstration;Tactile cues;Verbal cues    Comprehension  Verbalized understanding;Returned demonstration;Verbal cues required;Tactile cues required;Need further instruction       PT Short Term Goals - 08/27/18 1249      PT SHORT TERM GOAL #1   Title  Pt to report decreased pain in cervical region to 3/10 with activity    Baseline  Pain 5/10    Time  2    Period  Weeks    Status  New    Target Date  09/10/18        PT Long Term Goals - 08/27/18 1251      PT LONG TERM GOAL #1   Title  Pt to be independent with final HEP    Baseline  No HEP    Time  6    Period  Weeks    Status  New    Target Date  10/08/18      PT LONG TERM GOAL #2   Title  Pt to demo improved AROM of neck and R shoulder, to be WNL, for improved ability for ADLS    Baseline  Mild limtations for neck and shoulder, with increased pain.    Time  6    Period  Weeks    Status  New    Target Date  10/08/18      PT LONG  TERM GOAL #3   Title  Pt to report decreased pain in cervical/shoulder region to 0-2/10 with head turns, reaching, and lifting, to improve ability for return to work duties.    Baseline  Pain 5/10    Time  6    Period  Weeks    Status  New    Target Date  10/08/18      PT LONG TERM GOAL #4   Title  Pt to demo soft tissue restrictions to be WNL for R cervical and posterior shoulder region, to improve pain and function.    Baseline  Increased tightness and soreness in R UT, cervical, and posterior shoulder.    Time  6    Period  Weeks    Status  New    Target Date  10/08/18            Plan - 09/12/18 1218    Clinical Impression Statement  Patient was limited by being late. Therapy was unable to review exercises. Therap instead focused on manual therapy. She tolerated treatment well. She has had pain beneath her shoulder blade. He serratus anterior was needled. Shew had a good twitch respose to each needle.  Therapy perfromed IAYSTM to peri-scpaular area. She was encoraged to continue to perfrom ther-ex at home.    Examination-Activity Limitations  Locomotion Level;Reach Overhead;Carry;Sleep;Lift    Examination-Participation Restrictions  Cleaning;Community Activity;Shop;Laundry    Stability/Clinical Decision Making  Stable/Uncomplicated    Clinical Decision Making  Low    Rehab Potential  Good    PT Frequency  1x / week    PT Duration  4 weeks    PT Treatment/Interventions  ADLs/Self Care Home Management;Cryotherapy;Electrical Stimulation;Ultrasound;Traction;Moist Heat;Iontophoresis 4mg /ml Dexamethasone;Functional mobility training;Therapeutic activities;Therapeutic exercise;Neuromuscular re-education;Manual techniques;Patient/family education;Passive range of motion;Dry needling;Joint Manipulations;Spinal Manipulations;Taping    PT Next Visit Plan  Manual and/or DN for Neck, UT, posterior shoulder,  posture, scapular and shoulder strength. Consdier needling peri-scpaular area and  levator ; continue with postural exercises. Consdier supine progression    PT Home Exercise Plan  [id number] scap retractions, shoulder extensions, levator stretch  Consulted and Agree with Plan of Care  Patient       Patient will benefit from skilled therapeutic intervention in order to improve the following deficits and impairments:  Decreased range of motion, Decreased endurance, Increased muscle spasms, Impaired UE functional use, Pain, Hypomobility, Impaired flexibility, Improper body mechanics, Decreased strength, Decreased activity tolerance  Visit Diagnosis: Cervicalgia  Acute pain of right shoulder     Problem List Patient Active Problem List   Diagnosis Date Noted  . Chest pain, atypical 03/26/2016  . Migraine with aura and with status migrainosus, not intractable 03/26/2016  . Peripheral edema 03/26/2016  . Pre-diabetes 01/14/2016  . Insomnia 12/07/2015  . 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 Recurrent moderate major depressive disorder with anxiety ([facility]) 10/03/2015  . Hypothyroidism 08/22/2015  . Vitamin D deficiency 08/22/2015    Dessie Comaavid J Norma Montemurro PT DPT  09/12/2018, 12:24 PM  [facility] [address] [place], [place], [address] Phone: [phone]   Fax:  [phone]  Name: Jenkins RougeJowanda S Ionescu MRN: [id number] Date of Birth: 04/10/1980

## 2018-09-17 ENCOUNTER — Encounter: Payer: Medicaid Other | Admitting: Medical

## 2018-09-19 ENCOUNTER — Ambulatory Visit: Payer: Medicaid Other | Admitting: Physical Therapy

## 2018-09-19 ENCOUNTER — Other Ambulatory Visit: Payer: Self-pay

## 2018-09-19 ENCOUNTER — Encounter: Payer: Self-pay | Admitting: Physical Therapy

## 2018-09-19 DIAGNOSIS — M542 Cervicalgia: Secondary | ICD-10-CM

## 2018-09-19 DIAGNOSIS — M25511 Pain in right shoulder: Secondary | ICD-10-CM

## 2018-09-19 NOTE — Therapy (Signed)
[facility] [address] [place], [place], [address] Phone: [phone]   Fax:  [phone]  Physical Therapy Treatment/Recert   Patient Details  Name: Kari Gallagher MRN: [id number] Date of Birth: 07/05/1980 Referring Provider (PT): Duwayne HeckJason Rogers   Encounter Date: 09/19/2018  PT End of Session - 09/19/18 0845    Visit Number  4    Number of Visits  8    Date for PT Re-Evaluation  10/24/18    PT Start Time  0809   9 min late   PT Stop Time  0859    PT Time Calculation (min)  50 min    Activity Tolerance  Patient tolerated treatment well    Behavior During Therapy  [facility] for tasks assessed/performed       Past Medical History:  Diagnosis Date  . Frequent headaches   . Hypothyroidism   . Leaky heart valve     Past Surgical History:  Procedure Laterality Date  . CHOLECYSTECTOMY    . TUBAL LIGATION  2006    There were no vitals filed for this visit.  Subjective Assessment - 09/19/18 0812    Subjective  Patient reports her pain today is about a 2/10. She is sleeping better when xshe is able to go to bed. Because of her thyroid isssue she does wake up.    Limitations  Lifting;Writing;House hold activities    Diagnostic tests  Neg thoracic X-ray    Currently in Pain?  Yes    Pain Score  2     Pain Location  Scapula    Pain Orientation  Right    Pain Descriptors / Indicators  Aching    Pain Type  Chronic pain    Pain Onset  More than a month ago    Pain Frequency  Intermittent    Aggravating Factors   at night time    Pain Relieving Factors  rest    Effect of Pain on Daily Activities  diffiuclty sleeping         [facility] PT Assessment - 09/19/18 0001      AROM   Cervical - Right Rotation  74    Cervical - Left Rotation  88   with pain at end range      Strength   Overall Strength Comments  4+/5 right shoulder       Palpation   Palpation comment  Pain in the right lower trap area                     [facility]Ex[facility] - 09/19/18 0001      Neck Exercises: Standing   Other Standing Exercises  towel flexion 2x10; towel scpation x10    Other Standing Exercises  scap retraction red x20; shoulder extension red x20       Manual Therapy   Manual Therapy  Soft tissue mobilization;Myofascial release;Manual Traction    Manual therapy comments  skilled palpation of trigger points     Soft tissue mobilization  trigger point release to the upper traps    Manual Traction  gentle to cervical spine; sub-occipital release       Neck Exercises: Stretches   Upper Trapezius Stretch  30 seconds;2 reps;Right;Left    Levator Stretch  3 reps;30 seconds    Other Neck Stretches  posterior capsule stretch 3x20 sec hold        Trigger Point Dry Needling - 09/19/18 0001    Consent Given?  Yes    Education Handout Provided  Previously provided    Muscles Treated Upper Quadrant  Lower trapezius    Dry Needling Comments  3 spots in lower trp area with rib lock. careful not to drive too deep.     Lower trapezius Response  Twitch response elicited           PT Education - 09/19/18 0814    Education Details  reviewed POC    Person(s) Educated  Patient    Methods  Explanation;Demonstration;Tactile cues;Verbal cues    Comprehension  Verbalized understanding;Returned demonstration;Verbal cues required;Tactile cues required;Need further instruction       PT Short Term Goals - 09/19/18 0911      PT SHORT TERM GOAL #1   Title  Pt to report decreased pain in cervical region to 3/10 with activity    Baseline  2/10 pain at this time    Time  2    Period  Weeks    Status  Achieved        PT Long Term Goals - 09/19/18 0911      PT LONG TERM GOAL #1   Title  Pt to be independent with final HEP    Baseline  continue to work on HEP for UE strength and function    Time  4    Period  Weeks    Status  On-going    Target Date  09/19/18      PT LONG TERM GOAL  #2   Title  Pt to demo improved AROM of neck and R shoulder, to be WNL, for improved ability for ADLS    Baseline  pain with end range left rotation; right 74 degrees    Time  6    Period  Weeks    Status  On-going      PT LONG TERM GOAL #3   Title  Pt to report decreased pain in cervical/shoulder region to 0-2/10 with head turns, reaching, and lifting, to improve ability for return to work duties.    Baseline  still has minor pain reaching overhead    Time  4    Period  Weeks    Status  New    Target Date  10/17/18      PT LONG TERM GOAL #4   Title  Pt to demo soft tissue restrictions to be WNL for R cervical and posterior shoulder region, to improve pain and function.    Baseline  continuesd pain in the scpaular area.    Time  6    Period  Weeks    Status  On-going            Plan - 09/19/18 0847    Clinical Impression Statement  Patient is making progress. She has improved cercvical rotation to the left with pain at end range and improved right cervical rotation to 74 degrees. She has improved right shoulder strength to 4+/5. She has been using her right arm more. Sh eis having more low trap/rhomboid pain now. This may be due to increased use of her dominat arm. her low trap and Rhomboids were needled today using a rib lock technique. She had a good twitch. DShe has some PA hypomobility at T6,T7, and T-8. Therpay worked on mobilizing those segments. She would benefit from skilled therapy 1 W 4 to improve funtioanl strength of left UE.    Examination-Activity Limitations  Locomotion Level;Reach Overhead;Carry;Sleep;Lift    Examination-Participation Restrictions  Cleaning;Community Activity;Shop;Laundry  Stability/Clinical Decision Making  Stable/Uncomplicated    Clinical Decision Making  Low    Rehab Potential  Good    PT Frequency  1x / week    PT Duration  4 weeks    PT Treatment/Interventions  ADLs/Self Care Home Management;Cryotherapy;Electrical  Stimulation;Ultrasound;Traction;Moist Heat;Iontophoresis 4mg /ml Dexamethasone;Functional mobility training;Therapeutic activities;Therapeutic exercise;Neuromuscular re-education;Manual techniques;Patient/family education;Passive range of motion;Dry needling;Joint Manipulations;Spinal Manipulations;Taping    PT Next Visit Plan  Manual and/or DN for Neck, UT, posterior shoulder,  posture, scapular and shoulder strength. Consdier needling peri-scpaular area and levator ; continue with postural exercises. Consdier supine progression    PT Home Exercise Plan  [id number] scap retractions, shoulder extensions, levator stretch       Patient will benefit from skilled therapeutic intervention in order to improve the following deficits and impairments:  Decreased range of motion, Decreased endurance, Increased muscle spasms, Impaired UE functional use, Pain, Hypomobility, Impaired flexibility, Improper body mechanics, Decreased strength, Decreased activity tolerance  Visit Diagnosis: Cervicalgia  Acute pain of right shoulder     Problem List Patient Active Problem List   Diagnosis Date Noted  . Chest pain, atypical 03/26/2016  . Migraine with aura and with status migrainosus, not intractable 03/26/2016  . Peripheral edema 03/26/2016  . Pre-diabetes 01/14/2016  . Insomnia 12/07/2015  . 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 Recurrent moderate major depressive disorder with anxiety ([place]) 10/03/2015  . Hypothyroidism 08/22/2015  . Vitamin D deficiency 08/22/2015    Carney Living PT DPT  09/19/2018, 9:22 AM  [facility] [address] [place], [place], [address] Phone: [phone]   Fax:  [phone]  Name: Kari Gallagher MRN: [id number] Date of Birth: 1980-02-26

## 2018-10-03 ENCOUNTER — Encounter: Payer: Self-pay | Admitting: Physical Therapy

## 2018-10-03 ENCOUNTER — Other Ambulatory Visit: Payer: Self-pay

## 2018-10-03 ENCOUNTER — Ambulatory Visit: Payer: Medicaid Other | Attending: Orthopedic Surgery | Admitting: Physical Therapy

## 2018-10-03 DIAGNOSIS — M542 Cervicalgia: Secondary | ICD-10-CM | POA: Insufficient documentation

## 2018-10-03 DIAGNOSIS — M25511 Pain in right shoulder: Secondary | ICD-10-CM | POA: Diagnosis not present

## 2018-10-03 NOTE — Therapy (Addendum)
[place], [place], [address] Phone: [phone]   Fax:  [phone]  Physical Therapy Treatment/Discharge   Patient Details  Name: Kari Gallagher MRN: [id number] Date of Birth: 1980-08-21 Referring Provider (PT): Victorino December   Encounter Date: 10/03/2018  PT End of Session - 10/03/18 0923    Visit Number  5    Number of Visits  8    Date for PT Re-Evaluation  10/24/18    PT Start Time  0806    PT Stop Time  0844    PT Time Calculation (min)  38 min    Activity Tolerance  Patient tolerated treatment well    Behavior During Therapy  [facility] for tasks assessed/performed       Past Medical History:  Diagnosis Date  . Frequent headaches   . Hypothyroidism   . Leaky heart valve     Past Surgical History:  Procedure Laterality Date  . CHOLECYSTECTOMY    . TUBAL LIGATION  2006    There were no vitals filed for this visit.  Subjective Assessment - 10/03/18 0919    Subjective  Patient reports she has not had any pain. She has been working on her exercises.She feels confortable with her program.    Limitations  Lifting;Writing;House hold activities    Diagnostic tests  Neg thoracic X-ray    Currently in Pain?  Yes    Pain Score  2     Pain Location  Scapula    Pain Orientation  Right    Pain Descriptors / Indicators  Aching    Pain Type  Chronic pain    Pain Onset  More than a month ago    Pain Frequency  Intermittent    Aggravating Factors   at night time    Pain Relieving Factors  rest    Effect of Pain on Daily Activities  diffculty    Multiple Pain Sites  No         [facility] PT Assessment - 10/03/18 0001      AROM   Cervical - Right Rotation  82    Cervical - Left Rotation  88      Strength   Overall Strength Comments  4+/5 right shoulder       Palpation   Palpation comment  No pain and 5/5 strength                    [facility] Adult PT Treatment/Exercise - 10/03/18 0001      Neck Exercises: Standing   Other Standing Exercises  towel flexion 2x10; towel scpation x10    Other Standing Exercises  scap retraction blue x20; shoulder extension blue x20       Neck Exercises: Supine   Other Supine Exercise  supine bilateral ER red 2x10; horizontal abduction red 2x10       Manual Therapy   Manual Therapy  Soft tissue mobilization;Myofascial release;Manual Traction    Manual therapy comments  skilled palpation of trigger points     Soft tissue mobilization  trigger point release to the upper traps    Manual Traction  gentle to cervical spine; sub-occipital release       Neck Exercises: Stretches   Upper Trapezius Stretch  30 seconds;2 reps;Right;Left    Levator Stretch  3 reps;30 seconds    Other Neck Stretches  posterior capsule stretch 3x20 sec hold  PT Education - 10/03/18 [phone]    Education Details  reviewed HEp and symptom mangement    Person(s) Educated  Patient    Methods  Explanation;Tactile cues;Verbal cues;Demonstration    Comprehension  Verbalized understanding;Returned demonstration;Verbal cues required;Tactile cues required       PT Short Term Goals - 10/03/18 0924      PT SHORT TERM GOAL #1   Title  Pt to report decreased pain in cervical region to 3/10 with activity    Baseline  2/10 pain at this time    Time  2    Period  Weeks    Status  Achieved    Target Date  09/10/18        PT Long Term Goals - 09/19/18 0911      PT LONG TERM GOAL #1   Title  Pt to be independent with final HEP    Baseline  continue to work on HEP for UE strength and function    Time  4    Period  Weeks    Status  On-going    Target Date  09/19/18      PT LONG TERM GOAL #2   Title  Pt to demo improved AROM of neck and R shoulder, to be WNL, for improved ability for ADLS    Baseline  pain with end range left rotation; right 74 degrees    Time  6    Period  Weeks    Status  On-going      PT LONG TERM GOAL #3   Title  Pt to report  decreased pain in cervical/shoulder region to 0-2/10 with head turns, reaching, and lifting, to improve ability for return to work duties.    Baseline  still has minor pain reaching overhead    Time  4    Period  Weeks    Status  New    Target Date  10/17/18      PT LONG TERM GOAL #4   Title  Pt to demo soft tissue restrictions to be WNL for R cervical and posterior shoulder region, to improve pain and function.    Baseline  continuesd pain in the scpaular area.    Time  6    Period  Weeks    Status  On-going            Plan - 10/03/18 3893    Clinical Impression Statement  Patient has reached all goals for therapy. She had minor trigger points this morning. She has full range. She has been working on her stretches and exercises at home. Therapy advanced her postural exercises and reviewed trigger point shepards hook.    Examination-Activity Limitations  Locomotion Level;Reach Overhead;Carry;Sleep;Lift    Examination-Participation Restrictions  Cleaning;Community Activity;Shop;Laundry    Stability/Clinical Decision Making  Stable/Uncomplicated    Clinical Decision Making  Low    Rehab Potential  Good    PT Frequency  1x / week    PT Duration  4 weeks    PT Treatment/Interventions  ADLs/Self Care Home Management;Cryotherapy;Electrical Stimulation;Ultrasound;Traction;Moist Heat;Iontophoresis 9m/ml Dexamethasone;Functional mobility training;Therapeutic activities;Therapeutic exercise;Neuromuscular re-education;Manual techniques;Patient/family education;Passive range of motion;Dry needling;Joint Manipulations;Spinal Manipulations;Taping    PT Next Visit Plan  Manual and/or DN for Neck, UT, posterior shoulder,  posture, scapular and shoulder strength. Consdier needling peri-scpaular area and levator ; continue with postural exercises. Consdier supine progression    PT Home Exercise Plan  [id number] scap retractions, shoulder extensions, levator stretch    Consulted and Agree with Plan  of  Care  Patient       Patient will benefit from skilled therapeutic intervention in order to improve the following deficits and impairments:  Decreased range of motion, Decreased endurance, Increased muscle spasms, Impaired UE functional use, Pain, Hypomobility, Impaired flexibility, Improper body mechanics, Decreased strength, Decreased activity tolerance  Visit Diagnosis: Cervicalgia  Acute pain of right shoulder   PHYSICAL THERAPY DISCHARGE SUMMARY  Visits from Start of Care: 5  Current functional level related to goals / functional outcomes: Improved pain and function   Remaining deficits: No pain    Education / Equipment: HEP   Plan: Patient agrees to discharge.  Patient goals were met. Patient is being discharged due to not returning since the last visit.  ?????       Problem List Patient Active Problem List   Diagnosis Date Noted  . Chest pain, atypical 03/26/2016  . Migraine with aura and with status migrainosus, not intractable 03/26/2016  . Peripheral edema 03/26/2016  . Pre-diabetes 01/14/2016  . Insomnia 12/07/2015  . 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 Recurrent moderate major depressive disorder with anxiety ([place]) 10/03/2015  . Hypothyroidism 08/22/2015  . Vitamin D deficiency 08/22/2015    Carney Living  PT DPT  10/03/2018, 9:32 AM  [facility] [address] [place], [place], [address] Phone: [phone]   Fax:  [phone]  Name: Kari Gallagher MRN: [id number] Date of Birth: [birth year]

## 2018-10-15 ENCOUNTER — Other Ambulatory Visit (HOSPITAL_COMMUNITY)
Admission: RE | Admit: 2018-10-15 | Discharge: 2018-10-15 | Disposition: A | Discharge status: Home / Self Care | Payer: Medicaid Other | Source: Ambulatory Visit | Attending: Medical | Admitting: Medical

## 2018-10-15 ENCOUNTER — Encounter: Payer: Self-pay | Admitting: Medical

## 2018-10-15 ENCOUNTER — Other Ambulatory Visit: Payer: Self-pay

## 2018-10-15 ENCOUNTER — Ambulatory Visit (INDEPENDENT_AMBULATORY_CARE_PROVIDER_SITE_OTHER): Payer: Medicaid Other | Admitting: Medical

## 2018-10-15 VITALS — BP 125/80 | HR 97 | Wt 183.0 lb

## 2018-10-15 DIAGNOSIS — Z01419 Encounter for gynecological examination (general) (routine) without abnormal findings: Secondary | ICD-10-CM | POA: Diagnosis not present

## 2018-10-15 DIAGNOSIS — N3000 Acute cystitis without hematuria: Secondary | ICD-10-CM

## 2018-10-15 DIAGNOSIS — F5089 Other specified eating disorder: Secondary | ICD-10-CM

## 2018-10-15 DIAGNOSIS — L680 Hirsutism: Secondary | ICD-10-CM | POA: Diagnosis not present

## 2018-10-15 DIAGNOSIS — Z0000 Encounter for general adult medical examination without abnormal findings: Secondary | ICD-10-CM

## 2018-10-15 DIAGNOSIS — A599 Trichomoniasis, unspecified: Secondary | ICD-10-CM

## 2018-10-15 LAB — POCT URINALYSIS DIP (DEVICE)
Glucose, UA: 100 mg/dL — AB
Hgb urine dipstick: NEGATIVE
Ketones, ur: NEGATIVE mg/dL
Nitrite: POSITIVE — AB
Protein, ur: 30 mg/dL — AB
Specific Gravity, Urine: 1.025 (ref 1.005–1.030)
Urobilinogen, UA: 1 mg/dL (ref 0.0–1.0)
pH: 5.5 (ref 5.0–8.0)

## 2018-10-15 MED ORDER — NITROFURANTOIN MONOHYD MACRO 100 MG PO CAPS: 100.0000 mg | ORAL_CAPSULE | Freq: Two times a day (BID) | ORAL | 0 refills | Status: DC

## 2018-10-15 NOTE — Addendum Note (Signed)
Addended by: Luvenia Redden on: 10/15/2018 10:31 AM   Modules accepted: Orders

## 2018-10-15 NOTE — Progress Notes (Signed)
Pt scored 10 on PHQ 9.  Pt declines Integrated [facility] Clinician due to her already being managed by someone.    Mel Almond, RN 10/15/18

## 2018-10-15 NOTE — Progress Notes (Signed)
History:  Ms. Kari Gallagher is a 38 y.o. female who presents to clinic today for annual exam. The patient denies any history of abnormal pap smears. She states last pap was ~ 2 years ago. She has complaint today of a possible UTI due to frequency of urination. She states that this may also be due to excessive ice consumption. She denies previous diagnosis of anemia. She is also concerned about new hair growth on her chin. She states periods are regular. LMP 10/08/18. She is sexually active and has had BTL. She has hypothyroidism and has been referred to endocrinology.    The following portions of the patient's history were reviewed and updated as appropriate: allergies, current medications, family history, past medical history, social history, past surgical history and problem list.  Review of Systems:  Review of Systems  Constitutional: Positive for malaise/fatigue.  Gastrointestinal: Negative for abdominal pain.  Genitourinary: Positive for frequency. Negative for dysuria and urgency.       Neg - vaginal bleeding, discharge + vaginal irritation      Objective:  Physical Exam BP 125/80   Pulse 97   Wt 183 lb (83 kg)   LMP 10/08/2018 (Exact Date)   BMI 29.54 kg/m  Physical Exam  Nursing note and vitals reviewed. Constitutional: She is oriented to person, place, and time. She appears well-developed and well-nourished. No distress.  HENT:  Head: Normocephalic and atraumatic.  Eyes: EOM are normal.  Neck: Normal range of motion.  Cardiovascular: Normal rate, regular rhythm and normal heart sounds.  No murmur heard. Respiratory: Effort normal and breath sounds normal. No respiratory distress. She has no wheezes.  GI: Soft. Bowel sounds are normal. She exhibits no distension and no mass. There is no abdominal tenderness. There is no rebound and no guarding.  Genitourinary: Cervix exhibits no motion tenderness, no discharge and no friability.    Vaginal discharge (small, white)  present.     No vaginal bleeding.  No bleeding in the vagina.  Neurological: She is alert and oriented to person, place, and time.  Skin: Skin is warm and dry. No erythema.  Psychiatric: She has a normal mood and affect.   Results for orders placed or performed in visit on 10/15/18 (from the past 24 hour(s))  POCT urinalysis dip (device)     Status: Abnormal   Collection Time: 10/15/18 10:21 AM  Result Value Ref Range   Glucose, UA 100 (A) NEGATIVE mg/dL   Bilirubin Urine SMALL (A) NEGATIVE   Ketones, ur NEGATIVE NEGATIVE mg/dL   Specific Gravity, Urine 1.025 1.005 - 1.030   Hgb urine dipstick NEGATIVE NEGATIVE   pH 5.5 5.0 - 8.0   Protein, ur 30 (A) NEGATIVE mg/dL   Urobilinogen, UA 1.0 0.0 - 1.0 mg/dL   Nitrite POSITIVE (A) NEGATIVE   Leukocytes,Ua LARGE (A) NEGATIVE     Assessment & Plan:  1. Well woman exam with routine gynecological exama - Cytology - PAP( [facility])  2. Pica - CBC  3. Hirsutism - Testosterone, free - FSH - LH  4. Acute cystitis  - Rx for Macrobid sent to patient's pharmacy  - Urine culture sent  - Increased PO hydration advised   Patient will be contacted with any abnormal results  Advised that results are likely to be sent through [facility] Patient to follow-up with PCP and endocrinology as scheduled  Patient to follow-up with [facility] in 1 year for annual exam or sooner PRN   Luvenia Redden, PA-C 10/15/2018 10:08  AM

## 2018-10-15 NOTE — Patient Instructions (Signed)

## 2018-10-16 ENCOUNTER — Telehealth (INDEPENDENT_AMBULATORY_CARE_PROVIDER_SITE_OTHER): Payer: Medicaid Other | Admitting: Lactation Services

## 2018-10-16 DIAGNOSIS — F5089 Other specified eating disorder: Secondary | ICD-10-CM

## 2018-10-16 LAB — FOLLICLE STIMULATING HORMONE: FSH: 10.1 m[IU]/mL

## 2018-10-16 LAB — LUTEINIZING HORMONE: LH: 16.3 m[IU]/mL

## 2018-10-16 LAB — URINE CULTURE

## 2018-10-16 LAB — HIV ANTIBODY (ROUTINE TESTING W REFLEX): HIV Screen 4th Generation wRfx: NONREACTIVE

## 2018-10-16 LAB — CBC
Hematocrit: 35.3 % (ref 34.0–46.6)
Hemoglobin: 10.9 g/dL — ABNORMAL LOW (ref 11.1–15.9)
MCH: 24.7 pg — ABNORMAL LOW (ref 26.6–33.0)
MCHC: 30.9 g/dL — ABNORMAL LOW (ref 31.5–35.7)
MCV: 80 fL (ref 79–97)
Platelets: 282 10*3/uL (ref 150–450)
RBC: 4.41 x10E6/uL (ref 3.77–5.28)
RDW: 14.5 % (ref 11.7–15.4)
WBC: 7.8 10*3/uL (ref 3.4–10.8)

## 2018-10-16 LAB — RPR: RPR Ser Ql: NONREACTIVE

## 2018-10-16 LAB — TESTOSTERONE, FREE: Testosterone, Free: 1.1 pg/mL (ref 0.0–4.2)

## 2018-10-16 NOTE — Telephone Encounter (Signed)
Called and spoke with pt. In regards to lab results. Pt to come in Monday 10/19 @ 10:00 for Iron Panel.   Pt aware that PAP and Urine Culture have not resulted yet and she will be notified for any abnormal labs.   Pt advised to follow up with PCP for facial hair in light of her labs being normal.   Pt with no further questions/concerns at this time.

## 2018-10-16 NOTE — Telephone Encounter (Signed)
-----   Message from Luvenia Redden, PA-C sent at 10/16/2018 10:45 AM EDT ----- Please inform patient that labs are essentially normal. Due to her ice craving I would like to do an iron panel to further look into her possible anemia. Please schedule a lab only visit at patient's earliest convenience. Her labs do not indicate any concerning hormonal changes, so she should see her PCP to follow-up about her hirsuitism. Her pap smear and urine culture are not back yet.   Luvenia Redden, PA-C 10/16/2018 10:45 AM

## 2018-10-17 ENCOUNTER — Other Ambulatory Visit: Payer: Self-pay | Admitting: Emergency Medicine

## 2018-10-17 DIAGNOSIS — F5089 Other specified eating disorder: Secondary | ICD-10-CM

## 2018-10-20 ENCOUNTER — Other Ambulatory Visit: Payer: Self-pay

## 2018-10-20 ENCOUNTER — Other Ambulatory Visit: Payer: Medicaid Other

## 2018-10-20 DIAGNOSIS — E559 Vitamin D deficiency, unspecified: Secondary | ICD-10-CM

## 2018-10-20 DIAGNOSIS — F5089 Other specified eating disorder: Secondary | ICD-10-CM

## 2018-10-20 DIAGNOSIS — E034 Atrophy of thyroid (acquired): Secondary | ICD-10-CM

## 2018-10-21 ENCOUNTER — Other Ambulatory Visit: Payer: Self-pay | Admitting: Medical

## 2018-10-21 ENCOUNTER — Telehealth: Payer: Self-pay

## 2018-10-21 LAB — CYTOLOGY - PAP
Adequacy: ABSENT
Chlamydia: NEGATIVE
Comment: NEGATIVE
Comment: NEGATIVE
Comment: NORMAL
Diagnosis: NEGATIVE
High risk HPV: NEGATIVE
Neisseria Gonorrhea: NEGATIVE

## 2018-10-21 LAB — IRON,TIBC AND FERRITIN PANEL
Ferritin: 16 ng/mL (ref 15–150)
Iron Saturation: 6 % — CL (ref 15–55)
Iron: 24 ug/dL — ABNORMAL LOW (ref 27–159)
Total Iron Binding Capacity: 412 ug/dL (ref 250–450)
UIBC: 388 ug/dL (ref 131–425)

## 2018-10-21 MED ORDER — SODIUM CHLORIDE 0.9 % IV SOLN: 510.0000 mg | INTRAVENOUS | Status: AC

## 2018-10-21 NOTE — Telephone Encounter (Addendum)
-----   Message from Luvenia Redden, PA-C sent at 10/21/2018  3:46 PM EDT ----- Please set up for [facility]. I have put in the order.   Kari Gallagher   LM on VM of Short Stay that I am calling to schedule iron infusion if they could return call back.

## 2018-10-22 ENCOUNTER — Telehealth: Payer: Self-pay

## 2018-10-22 MED ORDER — METRONIDAZOLE 500 MG PO TABS: 2000.0000 mg | ORAL_TABLET | Freq: Once | ORAL | 0 refills | Status: AC

## 2018-10-22 NOTE — Telephone Encounter (Signed)
Called Kari Gallagher to advise of test results, no answer, left VM to call office back. Will also send My Chart message.

## 2018-10-22 NOTE — Telephone Encounter (Signed)
Pt called back regarding her Lab results explained that she would need to repeat pap in 1 year & that she tested positive for Trich, advised partner will need to be treated also, no sex until 2 weeks after partner has been Tx. Pt verbalized understanding. Pt also asked about the Iron infusion appt., advised that appt has not been made yet & she will be contacted once it has.

## 2018-10-22 NOTE — Addendum Note (Signed)
Addended by: Luvenia Redden on: 10/22/2018 08:47 AM   Modules accepted: Orders

## 2018-10-22 NOTE — Telephone Encounter (Signed)
Received call from Short Stay stating that the pt has an appt scheduled for 10/27/18 @ 1000.  Notified pt of her iron infusion appt and that her appt info is in her MyChart. Pt stated that she was able to get information via MyChart.  Pt verbalized understanding.

## 2018-10-22 NOTE — Telephone Encounter (Signed)
-----   Message from Luvenia Redden, PA-C sent at 10/22/2018  8:47 AM EDT ----- Please inform patient that she had a normal pap smear but the sample wasn't complete so she will need a repeat pap in 1 year. The sample also showed a trichomonas infection. I have sent Rx to the pharmacy and her partner will need treatment and they should abstain for 2 weeks from his treatment.   Luvenia Redden, PA-C 10/22/2018 8:47 AM

## 2018-10-27 ENCOUNTER — Other Ambulatory Visit: Payer: Self-pay

## 2018-10-27 ENCOUNTER — Other Ambulatory Visit (HOSPITAL_COMMUNITY): Payer: Self-pay | Admitting: *Deleted

## 2018-10-27 ENCOUNTER — Ambulatory Visit (HOSPITAL_COMMUNITY)
Admission: RE | Admit: 2018-10-27 | Discharge: 2018-10-27 | Disposition: A | Discharge status: Home / Self Care | Payer: Medicaid Other | Source: Ambulatory Visit | Attending: Medical | Admitting: Medical

## 2018-10-27 DIAGNOSIS — D649 Anemia, unspecified: Secondary | ICD-10-CM | POA: Insufficient documentation

## 2018-10-27 MED ORDER — SODIUM CHLORIDE 0.9 % IV SOLN
510.0000 mg | INTRAVENOUS | Status: DC
  Administered 2018-10-27: 11:00:00 510 mg via INTRAVENOUS
  Filled 2018-10-27: qty 17

## 2018-10-27 NOTE — Discharge Instructions (Signed)

## 2018-11-03 ENCOUNTER — Other Ambulatory Visit: Payer: Self-pay

## 2018-11-03 ENCOUNTER — Ambulatory Visit (HOSPITAL_COMMUNITY)
Admission: RE | Admit: 2018-11-03 | Discharge: 2018-11-03 | Disposition: A | Discharge status: Home / Self Care | Payer: Medicaid Other | Source: Ambulatory Visit | Attending: Medical | Admitting: Medical

## 2018-11-03 DIAGNOSIS — D649 Anemia, unspecified: Secondary | ICD-10-CM | POA: Insufficient documentation

## 2018-11-03 MED ORDER — SODIUM CHLORIDE 0.9 % IV SOLN
510.0000 mg | INTRAVENOUS | Status: DC
  Administered 2018-11-03: 510 mg via INTRAVENOUS
  Filled 2018-11-03: qty 17

## 2018-11-13 ENCOUNTER — Encounter: Payer: Self-pay | Admitting: Internal Medicine

## 2018-12-03 DIAGNOSIS — E559 Vitamin D deficiency, unspecified: Secondary | ICD-10-CM | POA: Diagnosis not present

## 2018-12-03 DIAGNOSIS — D509 Iron deficiency anemia, unspecified: Secondary | ICD-10-CM | POA: Diagnosis not present

## 2018-12-03 DIAGNOSIS — E034 Atrophy of thyroid (acquired): Secondary | ICD-10-CM | POA: Diagnosis not present

## 2018-12-04 DIAGNOSIS — E034 Atrophy of thyroid (acquired): Secondary | ICD-10-CM

## 2018-12-04 LAB — TSH: TSH: 4.43 u[IU]/mL (ref 0.450–4.500)

## 2018-12-04 LAB — VITAMIN D 25 HYDROXY (VIT D DEFICIENCY, FRACTURES): Vit D, 25-Hydroxy: 22.1 ng/mL — ABNORMAL LOW (ref 30.0–100.0)

## 2018-12-04 LAB — T4, FREE: Free T4: 0.98 ng/dL (ref 0.82–1.77)

## 2018-12-05 MED ORDER — LEVOTHYROXINE SODIUM 175 MCG PO TABS: 175.0000 ug | ORAL_TABLET | Freq: Every day | ORAL | 1 refills | Status: DC

## 2018-12-07 LAB — SPECIMEN STATUS REPORT

## 2018-12-07 LAB — IRON AND TIBC
Iron Saturation: 30 % (ref 15–55)
Iron: 88 ug/dL (ref 27–159)
Total Iron Binding Capacity: 289 ug/dL (ref 250–450)
UIBC: 201 ug/dL (ref 131–425)

## 2018-12-07 LAB — FERRITIN: Ferritin: 391 ng/mL — ABNORMAL HIGH (ref 15–150)

## 2019-02-02 ENCOUNTER — Ambulatory Visit (INDEPENDENT_AMBULATORY_CARE_PROVIDER_SITE_OTHER): Payer: Medicaid Other | Admitting: Internal Medicine

## 2019-02-02 ENCOUNTER — Encounter: Payer: Self-pay | Admitting: Internal Medicine

## 2019-02-02 ENCOUNTER — Other Ambulatory Visit: Payer: Self-pay

## 2019-02-02 VITALS — BP 120/76 | HR 90 | Temp 98.1°F | Ht 66.0 in | Wt 196.2 lb

## 2019-02-02 DIAGNOSIS — E890 Postprocedural hypothyroidism: Secondary | ICD-10-CM | POA: Diagnosis not present

## 2019-02-02 DIAGNOSIS — E034 Atrophy of thyroid (acquired): Secondary | ICD-10-CM

## 2019-02-02 DIAGNOSIS — E0500 Thyrotoxicosis with diffuse goiter without thyrotoxic crisis or storm: Secondary | ICD-10-CM

## 2019-02-02 LAB — T4, FREE: Free T4: 1.07 ng/dL (ref 0.60–1.60)

## 2019-02-02 LAB — TSH: TSH: 2.26 u[IU]/mL (ref 0.35–4.50)

## 2019-02-02 NOTE — Patient Instructions (Signed)

## 2019-02-02 NOTE — Progress Notes (Signed)
Name: Kari Gallagher  MRN/ DOB: [id number], 07-15-1980    Age/ Sex: 39 y.o., female    PCP: Smitty Cords, DO   Reason for Endocrinology Evaluation: Hypothyroidism     Date of Initial Endocrinology Evaluation: 02/02/2019     HPI: Kari Gallagher is a 39 y.o. female with a past medical history of migraine headaches, hypothyroidism and prediabetes. The patient presented for initial endocrinology clinic visit on 02/02/2019 for consultative assistance with her hypothyroidism.     Pt was diagnosed with hyperthyroidism in 2014 . She is S/P thyroid uptake and scan in 2014 and in 2016 with 24- hr increase uptake consistent with Graves' Disease.  She is S/P RAI ablation in 2016 followed by hypothyroidism (records not available)   She has been on LT- 4 replacement.   She has chronic fatigue and insomnia.  She takes levothyroxine on an empty stomach, 1 hr prior to breakfast. Takes MVI around 2 pm.     On Levothyroxine 175 mcg daily for the past year.   HISTORY:  Past Medical History:  Past Medical History:  Diagnosis Date  . Frequent headaches   . Hypothyroidism   . Leaky heart valve     Past Surgical History:  Past Surgical History:  Procedure Laterality Date  . CHOLECYSTECTOMY    . TUBAL LIGATION  2006      Social History:  reports that she quit smoking about 10 months ago. Her smoking use included cigarettes. She smoked 1.00 pack per day. She has quit using smokeless tobacco. She reports that she does not drink alcohol or use drugs.  Family History: family history includes Depression in her mother; Heart disease in her father; Lung cancer in her father; Mental illness in her mother; Stroke in her mother; Thyroid disease in her brother and father.   HOME MEDICATIONS: Allergies as of 02/02/2019   No Known Allergies     Medication List       Accurate as of February 02, 2019  7:35 AM. If you have any questions, ask your nurse or doctor.         levothyroxine 175 MCG tablet Commonly known as: SYNTHROID Take 1 tablet (175 mcg total) by mouth daily before breakfast.   multivitamin with minerals Tabs tablet Take 1 tablet by mouth daily.   nitrofurantoin (macrocrystal-monohydrate) 100 MG capsule Commonly known as: MACROBID Take 1 capsule (100 mg total) by mouth 2 (two) times daily.   sertraline 50 MG tablet Commonly known as: ZOLOFT Take 1 tablet (50 mg total) by mouth daily. May increase up to 100mg  in 2-4 weeks   traZODone 100 MG tablet Commonly known as: DESYREL Take 1 tablet (100 mg total) by mouth at bedtime. Start with half tablet (50mg ) for up to 1 week, then increase to 1 whole tablet         REVIEW OF SYSTEMS: A comprehensive ROS was conducted with the patient and is negative except as per HPI and below:  ROS     OBJECTIVE:  VS: There were no vitals taken for this visit.   Wt Readings from Last 3 Encounters:  11/03/18 180 lb (81.6 kg)  10/27/18 181 lb (82.1 kg)  10/15/18 183 lb (83 kg)     EXAM: General: Pt appears well and is in NAD  Eyes: External eye exam normal without stare, lid lag or exophthalmos.  EOM intact.  PERRL.  Neck: General: Supple without adenopathy. Thyroid: Thyroid size normal.  No goiter or  nodules appreciated. No thyroid bruit.  Lungs: Clear with good BS bilat with no rales, rhonchi, or wheezes  Heart: Auscultation: RRR.  Abdomen: Normoactive bowel sounds, soft, nontender, without masses or organomegaly palpable  Extremities:  BL LE: No pretibial edema normal ROM and strength.  Skin: Hair: Texture and amount normal with gender appropriate distribution Skin Inspection: No rashes Skin Palpation: Skin temperature, texture, and thickness normal to palpation  Neuro: Cranial nerves: II - XII grossly intact  Motor: Normal strength throughout DTRs: 2+ and symmetric in UE without delay in relaxation phase  Mental Status: Judgment, insight: Intact Orientation: Oriented to time, place,  and person Mood and affect: No depression, anxiety, or agitation     DATA REVIEWED:    Results for CANDAS, DEEMER (MRN [id number]) as of 02/03/2019 09:09  Ref. Range 12/03/2018 11:00 02/02/2019 11:10  TSH Latest Ref Range: 0.35 - 4.50 uIU/mL 4.430 2.26  T4,Free(Direct) Latest Ref Range: 0.60 - 1.60 ng/dL 0.98 1.07   ASSESSMENT/PLAN/RECOMMENDATIONS:   1. Postablative hypothyroidism    - Pt is clinically and biochemically euthyroid  - Her non-specific symptoms are not related to thyroid given recent normalization of TFT's - Pt educated extensively on the correct way to take levothyroxine (first thing in the morning with water, 30 minutes before eating or taking other medications). - Pt encouraged to double dose the following day if she were to miss a dose given long half-life of levothyroxine.   Medications :  Continue Levothyroxine 175 mcg daily   2. Grave's Disease:   No extra thyroidal manifestations of graves' disease    F/U in 4 months  Labs today and in 8 weeks   Signed electronically by: Mack Guise, MD  [facility] End[facility]  [address] [place], [place] [address] Phone: [phone] FAX: [phone]   CC: Olin Hauser, DO [place] [address] Phone: [phone] Fax: [phone]   Return to Endocrinology clinic as below: Future Appointments  Date Time Provider [place]  02/02/2019  9:30 AM Miquan Tandon, Melanie Crazier, MD [id number] None

## 2019-02-03 ENCOUNTER — Encounter: Payer: Self-pay | Admitting: Internal Medicine

## 2019-02-03 DIAGNOSIS — E0500 Thyrotoxicosis with diffuse goiter without thyrotoxic crisis or storm: Secondary | ICD-10-CM | POA: Insufficient documentation

## 2019-02-03 MED ORDER — LEVOTHYROXINE SODIUM 175 MCG PO TABS: 175.0000 ug | ORAL_TABLET | Freq: Every day | ORAL | 3 refills | Status: DC

## 2019-02-06 DIAGNOSIS — G4701 Insomnia due to medical condition: Secondary | ICD-10-CM

## 2019-02-06 DIAGNOSIS — F331 Major depressive disorder, recurrent, moderate: Secondary | ICD-10-CM

## 2019-02-06 MED ORDER — TRAZODONE HCL 100 MG PO TABS: 100.0000 mg | ORAL_TABLET | Freq: Every day | ORAL | 5 refills | Status: DC

## 2019-02-06 NOTE — Addendum Note (Signed)
Addended by: Smitty Cords on: 02/06/2019 05:54 PM   Modules accepted: Orders

## 2019-02-14 ENCOUNTER — Emergency Department (HOSPITAL_COMMUNITY)
Admission: EM | Admit: 2019-02-14 | Discharge: 2019-02-14 | Disposition: A | Discharge status: Home / Self Care | Payer: BC Managed Care – PPO | Attending: Emergency Medicine | Admitting: Emergency Medicine

## 2019-02-14 ENCOUNTER — Encounter (HOSPITAL_COMMUNITY): Payer: Self-pay

## 2019-02-14 ENCOUNTER — Other Ambulatory Visit: Payer: Self-pay

## 2019-02-14 DIAGNOSIS — E039 Hypothyroidism, unspecified: Secondary | ICD-10-CM | POA: Diagnosis not present

## 2019-02-14 DIAGNOSIS — Z87891 Personal history of nicotine dependence: Secondary | ICD-10-CM | POA: Diagnosis not present

## 2019-02-14 DIAGNOSIS — R110 Nausea: Secondary | ICD-10-CM | POA: Diagnosis not present

## 2019-02-14 DIAGNOSIS — Z79899 Other long term (current) drug therapy: Secondary | ICD-10-CM | POA: Diagnosis not present

## 2019-02-14 DIAGNOSIS — R109 Unspecified abdominal pain: Secondary | ICD-10-CM | POA: Diagnosis present

## 2019-02-14 DIAGNOSIS — R197 Diarrhea, unspecified: Secondary | ICD-10-CM | POA: Diagnosis not present

## 2019-02-14 DIAGNOSIS — B349 Viral infection, unspecified: Secondary | ICD-10-CM | POA: Insufficient documentation

## 2019-02-14 DIAGNOSIS — Z20822 Contact with and (suspected) exposure to covid-19: Secondary | ICD-10-CM | POA: Insufficient documentation

## 2019-02-14 DIAGNOSIS — R1084 Generalized abdominal pain: Secondary | ICD-10-CM

## 2019-02-14 LAB — CBC WITH DIFFERENTIAL/PLATELET
Abs Immature Granulocytes: 0.02 10*3/uL (ref 0.00–0.07)
Basophils Absolute: 0 10*3/uL (ref 0.0–0.1)
Basophils Relative: 0 %
Eosinophils Absolute: 0.1 10*3/uL (ref 0.0–0.5)
Eosinophils Relative: 1 %
HCT: 42.2 % (ref 36.0–46.0)
Hemoglobin: 13.7 g/dL (ref 12.0–15.0)
Immature Granulocytes: 0 %
Lymphocytes Relative: 32 %
Lymphs Abs: 2.2 10*3/uL (ref 0.7–4.0)
MCH: 30 pg (ref 26.0–34.0)
MCHC: 32.5 g/dL (ref 30.0–36.0)
MCV: 92.5 fL (ref 80.0–100.0)
Monocytes Absolute: 0.5 10*3/uL (ref 0.1–1.0)
Monocytes Relative: 6 %
Neutro Abs: 4.2 10*3/uL (ref 1.7–7.7)
Neutrophils Relative %: 61 %
Platelets: 214 10*3/uL (ref 150–400)
RBC: 4.56 MIL/uL (ref 3.87–5.11)
RDW: 14.5 % (ref 11.5–15.5)
WBC: 7 10*3/uL (ref 4.0–10.5)
nRBC: 0 % (ref 0.0–0.2)

## 2019-02-14 LAB — POC SARS CORONAVIRUS 2 AG -  ED: SARS Coronavirus 2 Ag: NEGATIVE

## 2019-02-14 LAB — COMPREHENSIVE METABOLIC PANEL
ALT: 16 U/L (ref 0–44)
AST: 19 U/L (ref 15–41)
Albumin: 4.2 g/dL (ref 3.5–5.0)
Alkaline Phosphatase: 95 U/L (ref 38–126)
Anion gap: 8 (ref 5–15)
BUN: 12 mg/dL (ref 6–20)
CO2: 24 mmol/L (ref 22–32)
Calcium: 9.2 mg/dL (ref 8.9–10.3)
Chloride: 105 mmol/L (ref 98–111)
Creatinine, Ser: 0.98 mg/dL (ref 0.44–1.00)
GFR calc Af Amer: >60 mL/min (ref >60)
GFR calc non Af Amer: >60 mL/min (ref >60)
Glucose, Bld: 98 mg/dL (ref 70–99)
Potassium: 4 mmol/L (ref 3.5–5.1)
Sodium: 137 mmol/L (ref 135–145)
Total Bilirubin: 0.5 mg/dL (ref 0.3–1.2)
Total Protein: 8.4 g/dL — ABNORMAL HIGH (ref 6.5–8.1)

## 2019-02-14 LAB — LIPASE, BLOOD: Lipase: 37 U/L (ref 11–51)

## 2019-02-14 LAB — URINALYSIS, ROUTINE W REFLEX MICROSCOPIC
Bilirubin Urine: NEGATIVE
Glucose, UA: NEGATIVE mg/dL
Hgb urine dipstick: NEGATIVE
Ketones, ur: NEGATIVE mg/dL
Nitrite: NEGATIVE
Protein, ur: NEGATIVE mg/dL
Specific Gravity, Urine: 1.028 (ref 1.005–1.030)
pH: 6 (ref 5.0–8.0)

## 2019-02-14 LAB — I-STAT BETA HCG BLOOD, ED (MC, WL, AP ONLY): I-stat hCG, quantitative: <5 m[IU]/mL (ref <5)

## 2019-02-14 MED ORDER — SODIUM CHLORIDE 0.9 % IV BOLUS
1000.0000 mL | Freq: Once | INTRAVENOUS | Status: AC
  Administered 2019-02-14: 1000 mL via INTRAVENOUS

## 2019-02-14 MED ORDER — ONDANSETRON HCL 4 MG/2ML IJ SOLN
4.0000 mg | Freq: Once | INTRAMUSCULAR | Status: AC
  Administered 2019-02-14: 12:00:00 4 mg via INTRAVENOUS
  Filled 2019-02-14: qty 2

## 2019-02-14 NOTE — ED Provider Notes (Signed)
[facility] DEPT Provider Note   CSN: [id number] Arrival date & time: 02/14/19  1048     History Chief Complaint  Patient presents with  . Abdominal Pain  . Nausea    Kari Gallagher is a 39 y.o. female with PMHx hypothyroidism who presents to the ED today complaining of gradual onset, intermittent, diffuse abdominal pain x 2-3 weeks.  She also endorses nausea.  She states that she has belly pain anytime she eats.  She states that even if she tries to eat a solid her stomach will become upset.  She is also endorsing diarrhea.  She reports her husband is in the ED as well with similar symptoms however his symptoms only started about a week ago.  She is denying any recent COVID-19 positive exposure however reports that her husband goes to work daily and could have been exposed there.  She denies fevers, chills, sore throat, cough, shortness of breath, vomiting, urinary symptoms, vaginal discharge, pelvic pain, any other associated symptoms. PSHx includes tubal ligation and cholecystectomy.   The history is provided by the patient and medical records.       Past Medical History:  Diagnosis Date  . Frequent headaches   . Hypothyroidism   . Leaky heart valve     Patient Active Problem List   Diagnosis Date Noted  . Graves disease 02/03/2019  . Chest pain, atypical 03/26/2016  . Migraine with aura and with status migrainosus, not intractable 03/26/2016  . Peripheral edema 03/26/2016  . Pre-diabetes 01/14/2016  . Insomnia 12/07/2015  . 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 Hypothyroidism 08/22/2015  . Vitamin D deficiency 08/22/2015    Past Surgical History:  Procedure Laterality Date  . CHOLECYSTECTOMY    . TUBAL LIGATION  2006     OB History   No obstetric history on file.     Family History  Problem Relation Age of Onset  . Stroke Mother   . Depression  Mother   . Mental illness Mother   . Heart disease Father   . Thyroid disease Father   . Lung cancer Father   . Thyroid disease Brother     Social History   Tobacco Use  . Smoking status: Former Smoker    Packs/day: 1.00    Types: Cigarettes    Quit date: 04/02/2018    Years since quitting: 0.8  . Smokeless tobacco: Former Network engineer Use Topics  . Alcohol use: No  . Drug use: No    Home Medications Prior to Admission medications   Medication Sig Start Date End Date Taking? Authorizing Provider  levothyroxine (SYNTHROID) 175 MCG tablet Take 1 tablet (175 mcg total) by mouth daily before breakfast. 02/03/19   Shamleffer, Melanie Crazier, MD  Multiple Vitamin (MULTIVITAMIN WITH MINERALS) TABS tablet Take 1 tablet by mouth daily.    [provider]  traZODone (DESYREL) 100 MG tablet Take 1 tablet (100 mg total) by mouth at bedtime. Start with half tablet (50mg ) for up to 1 week, then increase to 1 whole tablet 02/06/19   Olin Hauser, DO    Allergies    Patient has no known allergies.  Review of Systems   Review of Systems  Constitutional: Negative for chills and fever.  HENT: Negative for sore throat.   Respiratory: Negative for cough and shortness of breath.   Cardiovascular: Negative for chest pain.  Gastrointestinal: Positive for abdominal  pain, diarrhea and nausea. Negative for blood in stool and vomiting.  Genitourinary: Negative for difficulty urinating, dysuria, flank pain, pelvic pain and vaginal discharge.  Musculoskeletal: Negative for myalgias.  All other systems reviewed and are negative.   Physical Exam Updated Vital Signs BP (!) 157/81 (BP Location: Left Arm)   Pulse 98   Temp 98.4 F (36.9 C) (Oral)   Resp 18   LMP 01/22/2019 (Approximate)   SpO2 96%   Physical Exam Vitals and nursing note reviewed.  Constitutional:      Appearance: She is not ill-appearing or diaphoretic.  HENT:     Head: Normocephalic and atraumatic.    Eyes:     Conjunctiva/sclera: Conjunctivae normal.  Cardiovascular:     Rate and Rhythm: Normal rate and regular rhythm.     Heart sounds: Normal heart sounds.  Pulmonary:     Effort: Pulmonary effort is normal.     Breath sounds: Normal breath sounds. No wheezing, rhonchi or rales.  Abdominal:     General: Abdomen is flat.     Palpations: Abdomen is soft.     Tenderness: There is generalized abdominal tenderness. There is no right CVA tenderness, left CVA tenderness, guarding or rebound.  Musculoskeletal:     Cervical back: Neck supple.  Skin:    General: Skin is warm and dry.  Neurological:     Mental Status: She is alert.     ED Results / Procedures / Treatments   Labs (all labs ordered are listed, but only abnormal results are displayed) Labs Reviewed  COMPREHENSIVE METABOLIC PANEL - Abnormal; Notable for the following components:      Result Value   Total Protein 8.4 (*)    All other components within normal limits  URINALYSIS, ROUTINE W REFLEX MICROSCOPIC - Abnormal; Notable for the following components:   APPearance CLOUDY (*)    Leukocytes,Ua SMALL (*)    Bacteria, UA RARE (*)    All other components within normal limits  NOVEL CORONAVIRUS, NAA (HOSP ORDER, SEND-OUT TO REF LAB; TAT 18-24 HRS)  URINE CULTURE  LIPASE, BLOOD  CBC WITH DIFFERENTIAL/PLATELET  I-STAT BETA HCG BLOOD, ED ([facility], [facility], [facility] ONLY)  POC SARS CORONAVIRUS 2 AG -  ED    EKG None  Radiology No results found.  Procedures Procedures (including critical care time)  Medications Ordered in ED Medications  ondansetron (ZOFRAN) injection 4 mg (4 mg Intravenous Given 02/14/19 1140)  sodium chloride 0.9 % bolus 1,000 mL (0 mLs Intravenous Stopped 02/14/19 1228)    ED Course  I have reviewed the triage vital signs and the nursing notes.  Pertinent labs & imaging results that were available during my care of the patient were reviewed by me and considered in my medical decision making (see chart for  details).  39 year old female presents the ED today complaining of 2.5 weeks of abdominal pain that is present after she eats any type of food, nausea, diarrhea.  She does report her husband is in the ED as well today with complaints of abdominal pain for the past week with diarrhea and was sent by his employer for Covid testing as there have been cases of Covid at their job.  Patient is denying any recent COVID-19 positive exposure however lives in close quarters with her husband.  Arrival to the ED she is afebrile, nontachycardic and nontachypneic.  Past surgical history includes cholecystectomy.  She does report she had acute appendicitis a couple of years ago however they medically treated this  and did not take out her appendix.  He states that her symptoms today do not feel similar to this.  She denies fevers or chills at home.  On exam today she has some abdominal tenderness diffusely however no focal tenderness to the right lower quadrant.  No peritoneal signs.  Will obtain screening labs today and swab for Covid.  Fluids and nausea medicine given for symptomatic relief.  Labwork reassuring today.  CBC without leukocytosis. Hgb stable.  CMP without electrolyte abnormalities.  Lipase within normal limits.  Rapid covid negative. Will reswab with send out.   U/A with small leuks however 21-50 WBCs per HPF; suspect contamination. Given abdominal pain without urinary symptoms I will send for culture today.   On reeval patient states she feels improved and is ready to go home. I have advised her to self isolate until she receives her COVID results. Strict return precautions discussed; pt is in agreement with plan and stable for discharge home.   This note was prepared using Dragon voice recognition software and may include unintentional dictation errors due to the inherent limitations of voice recognition software.  Kari Gallagher was evaluated in Emergency Department on 02/14/2019 for the symptoms  described in the history of present illness. She was evaluated in the context of the global COVID-19 pandemic, which necessitated consideration that the patient might be at risk for infection with the SARS-CoV-2 virus that causes COVID-19. Institutional protocols and algorithms that pertain to the evaluation of patients at risk for COVID-19 are in a state of rapid change based on information released by regulatory bodies including the CDC and federal and state organizations. These policies and algorithms were followed during the patient's care in the ED.       MDM Rules/Calculators/A&P                       Final Clinical Impression(s) / ED Diagnoses Final diagnoses:  Generalized abdominal pain  Nausea  Diarrhea, unspecified type  Viral illness    Rx / DC Orders ED Discharge Orders    None       Discharge Instructions     Your labwork was reassuring today. There was some bacteria in your urine - we have sent this for culture. Given you are not having any urinary symptoms we will hold off on treating you with antibiotics today.   We have swabbed you for COVID 19 today - please stay home and self isolate until you receive your results. IF positive you will need to self isolate for 14 days starting today (cleared: 03/01/2019). If negative you may resume normal daily activity.   Return to the ED IMMEDIATELY for any worsening symptoms including worsening abdominal pain, vomiting excessively, vomiting blood, fevers > 100.4, shortness of breath, chest pain, or any other associated symptoms.   Follow up with your PCP regarding your ED visit today.        Tanda Rockers, PA-C 02/14/19 1310    Long, Arlyss Repress, MD 02/15/19 [phone]

## 2019-02-14 NOTE — Discharge Instructions (Addendum)
Your labwork was reassuring today. There was some bacteria in your urine - we have sent this for culture. Given you are not having any urinary symptoms we will hold off on treating you with antibiotics today.   We have swabbed you for COVID 19 today - please stay home and self isolate until you receive your results. IF positive you will need to self isolate for 14 days starting today (cleared: 03/01/2019). If negative you may resume normal daily activity.   Return to the ED IMMEDIATELY for any worsening symptoms including worsening abdominal pain, vomiting excessively, vomiting blood, fevers > 100.4, shortness of breath, chest pain, or any other associated symptoms.   Follow up with your PCP regarding your ED visit today.

## 2019-02-14 NOTE — ED Triage Notes (Signed)
Pt presents with c/o abdominal pain and nausea for 2.5 weeks. Pt also c/o hand swelling.

## 2019-02-15 LAB — NOVEL CORONAVIRUS, NAA (HOSP ORDER, SEND-OUT TO REF LAB; TAT 18-24 HRS): SARS-CoV-2, NAA: NOT DETECTED

## 2019-02-16 LAB — URINE CULTURE

## 2019-03-13 ENCOUNTER — Other Ambulatory Visit: Payer: Self-pay

## 2019-03-13 DIAGNOSIS — Z87891 Personal history of nicotine dependence: Secondary | ICD-10-CM | POA: Diagnosis not present

## 2019-03-13 DIAGNOSIS — R070 Pain in throat: Secondary | ICD-10-CM | POA: Diagnosis not present

## 2019-03-13 DIAGNOSIS — R05 Cough: Secondary | ICD-10-CM | POA: Diagnosis not present

## 2019-03-13 DIAGNOSIS — R0602 Shortness of breath: Secondary | ICD-10-CM | POA: Insufficient documentation

## 2019-03-13 DIAGNOSIS — Z20822 Contact with and (suspected) exposure to covid-19: Secondary | ICD-10-CM | POA: Insufficient documentation

## 2019-03-13 DIAGNOSIS — Z79899 Other long term (current) drug therapy: Secondary | ICD-10-CM | POA: Diagnosis not present

## 2019-03-13 DIAGNOSIS — E039 Hypothyroidism, unspecified: Secondary | ICD-10-CM | POA: Diagnosis not present

## 2019-03-13 DIAGNOSIS — R519 Headache, unspecified: Secondary | ICD-10-CM | POA: Diagnosis present

## 2019-03-14 ENCOUNTER — Emergency Department (HOSPITAL_COMMUNITY)
Admission: EM | Admit: 2019-03-14 | Discharge: 2019-03-14 | Disposition: A | Discharge status: Home / Self Care | Payer: BC Managed Care – PPO | Attending: Emergency Medicine | Admitting: Emergency Medicine

## 2019-03-14 ENCOUNTER — Encounter (HOSPITAL_COMMUNITY): Payer: Self-pay

## 2019-03-14 DIAGNOSIS — Z20822 Contact with and (suspected) exposure to covid-19: Secondary | ICD-10-CM

## 2019-03-14 DIAGNOSIS — R0602 Shortness of breath: Secondary | ICD-10-CM | POA: Diagnosis not present

## 2019-03-14 LAB — POC SARS CORONAVIRUS 2 AG -  ED: SARS Coronavirus 2 Ag: NEGATIVE

## 2019-03-14 LAB — SARS CORONAVIRUS 2 (TAT 6-24 HRS): SARS Coronavirus 2: NEGATIVE

## 2019-03-14 LAB — INFLUENZA PANEL BY PCR (TYPE A & B)
Influenza A By PCR: NEGATIVE
Influenza B By PCR: NEGATIVE

## 2019-03-14 MED ORDER — AEROCHAMBER PLUS FLO-VU MISC: 2 refills | Status: DC

## 2019-03-14 MED ORDER — PROMETHAZINE-DM 6.25-15 MG/5ML PO SYRP: 5.0000 mL | ORAL_SOLUTION | Freq: Four times a day (QID) | ORAL | 0 refills | Status: DC | PRN

## 2019-03-14 MED ORDER — ALBUTEROL SULFATE HFA 108 (90 BASE) MCG/ACT IN AERS: 2.0000 | INHALATION_SPRAY | RESPIRATORY_TRACT | 0 refills | Status: DC | PRN

## 2019-03-14 MED ORDER — ACETAMINOPHEN 500 MG PO TABS
1000.0000 mg | ORAL_TABLET | Freq: Once | ORAL | Status: AC
  Administered 2019-03-14: 1000 mg via ORAL
  Filled 2019-03-14: qty 2

## 2019-03-14 NOTE — ED Triage Notes (Addendum)
Arrived POV from home. Patient reports headache and cold/flu like symptoms. Patient reports she was at a family gathering 2 days ago and since returning home has not felt well. Came to ED to make sure she does not have COVID or the FLU

## 2019-03-14 NOTE — Discharge Instructions (Signed)
1. Medications: Alternate tylenol and ibuprofen for fever control, albuterol for wheezing or shortness of breath, promethazine DM for cough, continue usual home medications 2. Treatment: rest, drink plenty of fluids, isolate for the next 10 days 3. Follow Up: Please followup with your primary doctor if your symptoms are not improving after 10-14 days; Please return to the ER for high fevers, persistent vomiting, shortness of breath or other concerns.

## 2019-03-14 NOTE — ED Provider Notes (Signed)
[facility]-EMERGENCY DEPT Provider Note   CSN: [id number] Arrival date & time: 03/13/19  2332     History Chief Complaint  Patient presents with  . Flu Like symptoms    Kari Gallagher is a 39 y.o. female with a hx of hypothyroidism presents to the Emergency Department complaining of gradual, persistent, progressively worsening generalized and throbbing headache onset 2 days ago.  Additionally, patient complains of sore throat, intermittent cough and mild shortness of breath.  She reports she was with her family approximately 5 days ago and since that time everyone in the family has come down with similar symptoms.  No treatments prior to arrival.  Nothing seems to make her symptoms better or worse.  No one in the family has yet tested positive for Covid however patient and her daughter are the first to be tested tonight.  The history is provided by the patient and medical records. No language interpreter was used.       Past Medical History:  Diagnosis Date  . Frequent headaches   . Hypothyroidism   . Leaky heart valve     Patient Active Problem List   Diagnosis Date Noted  . Graves disease 02/03/2019  . Chest pain, atypical 03/26/2016  . Migraine with aura and with status migrainosus, not intractable 03/26/2016  . Peripheral edema 03/26/2016  . Pre-diabetes 01/14/2016  . Insomnia 12/07/2015  . 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 Recurrent moderate major depressive disorder with anxiety ([facility]) 10/03/2015  . Hypothyroidism 08/22/2015  . Vitamin D deficiency 08/22/2015    Past Surgical History:  Procedure Laterality Date  . CHOLECYSTECTOMY    . TUBAL LIGATION  2006     OB History   No obstetric history on file.     Family History  Problem Relation Age of Onset  . Stroke Mother   . Depression Mother   . Mental illness Mother   . Heart disease Father   . Thyroid disease Father   . Lung cancer Father   . Thyroid disease Brother      Social History   Tobacco Use  . Smoking status: Former Smoker    Packs/day: 0.50    Types: Cigarettes    Quit date: 04/02/2018    Years since quitting: 0.9  . Smokeless tobacco: Former Engineer, water Use Topics  . Alcohol use: No  . Drug use: No    Home Medications Prior to Admission medications   Medication Sig Start Date End Date Taking? Authorizing Provider  albuterol (VENTOLIN HFA) 108 (90 Base) MCG/ACT inhaler Inhale 2 puffs into the lungs every 2 (two) hours as needed for wheezing or shortness of breath (cough). 03/14/19   Markell Sciascia, Dahlia Client, PA-C  levothyroxine (SYNTHROID) 175 MCG tablet Take 1 tablet (175 mcg total) by mouth daily before breakfast. 02/03/19   Shamleffer, Konrad Dolores, MD  Multiple Vitamin (MULTIVITAMIN WITH MINERALS) TABS tablet Take 1 tablet by mouth daily.    [provider]  promethazine-dextromethorphan (PROMETHAZINE-DM) 6.25-15 MG/5ML syrup Take 5 mLs by mouth 4 (four) times daily as needed for cough. 03/14/19   Mamoru Takeshita, Dahlia Client, PA-C  Spacer/Aero-Holding Chambers (AEROCHAMBER PLUS WITH MASK) inhaler Use as instructed 03/14/19   Grant Henkes, Dahlia Client, PA-C  traZODone (DESYREL) 100 MG tablet Take 1 tablet (100 mg total) by mouth at bedtime. Start with half tablet (50mg ) for up to 1 week, then increase to 1 whole tablet 02/06/19   04/06/19, DO    Allergies    Patient  has no known allergies.  Review of Systems   Review of Systems  Constitutional: Positive for fatigue. Negative for appetite change, chills and fever.  HENT: Negative for congestion, ear discharge, ear pain, mouth sores, postnasal drip, rhinorrhea, sinus pressure and sore throat.   Eyes: Negative for visual disturbance.  Respiratory: Positive for cough and shortness of breath. Negative for chest tightness, wheezing and stridor.   Cardiovascular: Negative for chest pain, palpitations and leg swelling.  Gastrointestinal: Negative for abdominal pain, diarrhea,  nausea and vomiting.  Genitourinary: Negative for dysuria, frequency, hematuria and urgency.  Musculoskeletal: Negative for arthralgias, back pain, myalgias and neck stiffness.  Skin: Negative for rash.  Neurological: Positive for headaches. Negative for syncope, light-headedness and numbness.  Hematological: Negative for adenopathy.  Psychiatric/Behavioral: The patient is not nervous/anxious.   All other systems reviewed and are negative.   Physical Exam Updated Vital Signs BP 120/77 (BP Location: Left Arm)   Pulse 87   Temp 98.5 F (36.9 C) (Oral)   Resp 18   Ht 5\' 5"  (1.651 m)   Wt 86.2 kg   LMP 02/22/2019   SpO2 100%   BMI 31.62 kg/m   Physical Exam Vitals and nursing note reviewed.  Constitutional:      General: She is not in acute distress.    Appearance: She is not diaphoretic.  HENT:     Head: Normocephalic.  Eyes:     General: No scleral icterus.    Conjunctiva/sclera: Conjunctivae normal.  Cardiovascular:     Rate and Rhythm: Normal rate and regular rhythm.     Pulses: Normal pulses.          Radial pulses are 2+ on the right side and 2+ on the left side.  Pulmonary:     Effort: No tachypnea, accessory muscle usage, prolonged expiration, respiratory distress or retractions.     Breath sounds: Normal breath sounds. No stridor.     Comments: Equal chest rise. No increased work of breathing. Clear and equal breath sounds Abdominal:     General: There is no distension.     Palpations: Abdomen is soft.     Tenderness: There is no abdominal tenderness. There is no guarding or rebound.  Musculoskeletal:     Cervical back: Normal range of motion.     Comments: Moves all extremities equally and without difficulty.  Skin:    General: Skin is warm and dry.     Capillary Refill: Capillary refill takes less than 2 seconds.  Neurological:     Mental Status: She is alert.     GCS: GCS eye subscore is 4. GCS verbal subscore is 5. GCS motor subscore is 6.      Comments: Speech is clear and goal oriented.  Psychiatric:        Mood and Affect: Mood normal.     ED Results / Procedures / Treatments   Labs (all labs ordered are listed, but only abnormal results are displayed) Labs Reviewed  SARS CORONAVIRUS 2 (TAT 6-24 HRS)  INFLUENZA PANEL BY PCR (TYPE A & B)  POC SARS CORONAVIRUS 2 AG -  ED    Procedures Procedures (including critical care time)  Medications Ordered in ED Medications - No data to display  ED Course  I have reviewed the triage vital signs and the nursing notes.  Pertinent labs & imaging results that were available during my care of the patient were reviewed by me and considered in my medical decision making (see chart for  details).    MDM Rules/Calculators/A&P                      CHERRON BLITZER was evaluated in Emergency Department on 03/14/2019 for the symptoms described in the history of present illness. She was evaluated in the context of the global COVID-19 pandemic, which necessitated consideration that the patient might be at risk for infection with the SARS-CoV-2 virus that causes COVID-19. Institutional protocols and algorithms that pertain to the evaluation of patients at risk for COVID-19 are in a state of rapid change based on information released by regulatory bodies including the CDC and federal and state organizations. These policies and algorithms were followed during the patient's care in the ED.  Patient presents with Covid-like symptoms.  She is well-appearing on exam.  No hypoxia or tachycardia.  Patient expresses concern for possible Covid and/or flu.  Will swab for both.  Patient does have my chart and will follow up on results.  In the meantime we will give symptomatic therapy including albuterol for mild shortness of breath.  No wheezing here on exam.  Will additionally give cough medication.  Recommend Tylenol for headache and fever control.  Discussed reasons to return to the emergency department  including new or worsening symptoms.  Patient states understanding and is in agreement with the plan.  Final Clinical Impression(s) / ED Diagnoses Final diagnoses:  Suspected COVID-19 virus infection    Rx / DC Orders ED Discharge Orders         Ordered    albuterol (VENTOLIN HFA) 108 (90 Base) MCG/ACT inhaler  Every 2 hours PRN     03/14/19 0533    Spacer/Aero-Holding Chambers (AEROCHAMBER PLUS WITH MASK) inhaler     03/14/19 0533    promethazine-dextromethorphan (PROMETHAZINE-DM) 6.25-15 MG/5ML syrup  4 times daily PRN     03/14/19 0533           Clemie General, Jarrett Soho, PA-C 03/14/19 0534    Mesner, Corene Cornea, MD 03/14/19 [phone]

## 2019-03-17 ENCOUNTER — Encounter (HOSPITAL_COMMUNITY): Payer: Self-pay

## 2019-03-17 ENCOUNTER — Other Ambulatory Visit: Payer: Self-pay

## 2019-03-17 ENCOUNTER — Emergency Department (HOSPITAL_COMMUNITY)
Admission: EM | Admit: 2019-03-17 | Discharge: 2019-03-17 | Disposition: A | Discharge status: Home / Self Care | Payer: BC Managed Care – PPO | Attending: Emergency Medicine | Admitting: Emergency Medicine

## 2019-03-17 DIAGNOSIS — Z87891 Personal history of nicotine dependence: Secondary | ICD-10-CM | POA: Insufficient documentation

## 2019-03-17 DIAGNOSIS — R0602 Shortness of breath: Secondary | ICD-10-CM | POA: Diagnosis not present

## 2019-03-17 DIAGNOSIS — J029 Acute pharyngitis, unspecified: Secondary | ICD-10-CM | POA: Diagnosis not present

## 2019-03-17 DIAGNOSIS — E039 Hypothyroidism, unspecified: Secondary | ICD-10-CM | POA: Diagnosis not present

## 2019-03-17 DIAGNOSIS — Z20822 Contact with and (suspected) exposure to covid-19: Secondary | ICD-10-CM | POA: Diagnosis not present

## 2019-03-17 DIAGNOSIS — Z79899 Other long term (current) drug therapy: Secondary | ICD-10-CM | POA: Insufficient documentation

## 2019-03-17 DIAGNOSIS — R519 Headache, unspecified: Secondary | ICD-10-CM | POA: Diagnosis not present

## 2019-03-17 LAB — SARS CORONAVIRUS 2 (TAT 6-24 HRS): SARS Coronavirus 2: NEGATIVE

## 2019-03-17 NOTE — ED Provider Notes (Signed)
[facility] EMERGENCY DEPARTMENT Provider Note   CSN: [id number] Arrival date & time: 03/17/19  1346     History Chief Complaint  Patient presents with  . Covid Exposure    Kari Gallagher is a 39 y.o. female with history of hypothyroidism who presents with close exposure to COVID+.   HPI   Patient's daughter developed symptoms on Wednesday night. She took her to the ED on Friday to be swabbed for COVID. Result returned on Sunday as positive. She been around her daughter until the results return. They have tried to separate from her, use sanitizer since discovering the results. Of note patient and her family went to visit her father the weekend of March 6th. Father developed symptoms on Wednesday, his test and his family all returned positive on Sunday. Her sister who was also present that weekend has also tested positive  Patient developed sore throat, headache, SOB, malaise over the weekend. When seen in the ED she was given albuterol to help with SOB, she feels this is helping some. Patient denies fever, chest pain, changes in smell or taste, rash, increase work of breathing, rhinorrhea, cough, ear pain.      Past Medical History:  Diagnosis Date  . Frequent headaches   . Hypothyroidism   . Leaky heart valve     Patient Active Problem List   Diagnosis Date Noted  . Graves disease 02/03/2019  . Chest pain, atypical 03/26/2016  . Migraine with aura and with status migrainosus, not intractable 03/26/2016  . Peripheral edema 03/26/2016  . Pre-diabetes 01/14/2016  . Insomnia 12/07/2015  . 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 Recurrent moderate major depressive disorder with anxiety ([place]) 10/03/2015  . Hypothyroidism 08/22/2015  . Vitamin D deficiency 08/22/2015    Past Surgical History:  Procedure Laterality Date  . CHOLECYSTECTOMY    . TUBAL LIGATION  2006     OB History   No obstetric history on file.     Family History  Problem Relation  Age of Onset  . Stroke Mother   . Depression Mother   . Mental illness Mother   . Heart disease Father   . Thyroid disease Father   . Lung cancer Father   . Thyroid disease Brother     Social History   Tobacco Use  . Smoking status: Former Smoker    Packs/day: 0.50    Types: Cigarettes    Quit date: 04/02/2018    Years since quitting: 0.9  . Smokeless tobacco: Former Network engineer Use Topics  . Alcohol use: No  . Drug use: No    Home Medications Prior to Admission medications   Medication Sig Start Date End Date Taking? Authorizing Provider  albuterol (VENTOLIN HFA) 108 (90 Base) MCG/ACT inhaler Inhale 2 puffs into the lungs every 2 (two) hours as needed for wheezing or shortness of breath (cough). 03/14/19   Muthersbaugh, Jarrett Soho, PA-C  levothyroxine (SYNTHROID) 175 MCG tablet Take 1 tablet (175 mcg total) by mouth daily before breakfast. 02/03/19   Shamleffer, Melanie Crazier, MD  Multiple Vitamin (MULTIVITAMIN WITH MINERALS) TABS tablet Take 1 tablet by mouth daily.    [provider]  promethazine-dextromethorphan (PROMETHAZINE-DM) 6.25-15 MG/5ML syrup Take 5 mLs by mouth 4 (four) times daily as needed for cough. 03/14/19   Muthersbaugh, Jarrett Soho, PA-C  Spacer/Aero-Holding Chambers (AEROCHAMBER PLUS WITH MASK) inhaler Use as instructed 03/14/19   Muthersbaugh, Jarrett Soho, PA-C  traZODone (DESYREL) 100 MG tablet Take 1 tablet (100 mg total)  by mouth at bedtime. Start with half tablet (50mg ) for up to 1 week, then increase to 1 whole tablet 02/06/19   04/06/19, DO    Allergies    Patient has no known allergies.  Review of Systems   Review of Systems   Negative except as stated in the HPI  Physical Exam Updated Vital Signs Pulse 100   Temp 97.9 F (36.6 C) (Oral)   LMP 02/22/2019   SpO2 98%   Physical Exam General: Alert, well-appearing female in NAD.  HEENT:              Eyes: Sclerae are anicteric. Red reflex normal bilaterally. Normal corneal light  reflex. Normal cover/uncover test             Throat: Moist mucous membranes.Oropharynx clear with no erythema or exudate Neck: normal range of motion, no lymphadenopathy Cardiovascular: Regular rate and rhythm, S1 and S2 normal. No murmur, rub, or gallop appreciated. Radial pulse +2 bilaterally Pulmonary: Normal work of breathing. Clear to auscultation bilaterally with no wheezes or crackles present, Cap refill <2 secs Abdomen: Normoactive bowel sounds. Soft, non-tender, non-distended.  Extremities: Warm and well-perfused, without cyanosis or edema. Full ROM Skin: No rashes or lesions.  ED Results / Procedures / Treatments   Labs (all labs ordered are listed, but only abnormal results are displayed) Labs Reviewed  SARS CORONAVIRUS 2 (TAT 6-24 HRS)    EKG None  Radiology No results found.  Procedures Procedures (including critical care time)  Medications Ordered in ED Medications - No data to display  ED Course  I have reviewed the triage vital signs and the nursing notes.  Pertinent labs & imaging results that were available during my care of the patient were reviewed by me and considered in my medical decision making (see chart for details).    MDM Rules/Calculators/A&P                      38y/o female with history of hypothyroidism who presents with multiple COVID+ exposure, here for COVID testing. Patient symptomatic starting Sunday, symptoms include: sore throat, headache, SOB, malaise over the weekend. Vital signs within normal limits. Physical exam unremarkable. Normal work of breathing.   Provided COVID+ test, discussed how to obtain results via my-chart. Educated on symptoms of respiratory distress. Return precautions provided. Recommended using albuterol prn for intermittent SOB. Discussed with  continuing quarantine until results return. Discussed separating from daughter as much as possible. They should reach out to the health department to determine if quarantine  is required given close exposure and inability to completely separate from COVID+daughter. Recommended reaching out to health department regardless of results. Patient voiced understanding of plan and feels comfortable with discharge.   Final Clinical Impression(s) / ED Diagnoses Final diagnoses:  Close exposure to COVID-19 virus    Rx / DC Orders ED Discharge Orders    None       Tuesday I, MD 03/17/19 1540    03/19/19, MD 03/22/19 2334

## 2019-03-17 NOTE — ED Triage Notes (Signed)
Pt. Coming in this afternoon with a c/o COVID exposure that occurred over the weekend. Pt. States that family members were starting to have some symptoms, such as headaches, sore throats, and emesis. Pt. Began having these symptoms yesterday. Pts. Daughter test positive for COVID this Sunday. No fevers.

## 2019-03-26 ENCOUNTER — Other Ambulatory Visit: Payer: Self-pay | Admitting: Internal Medicine

## 2019-03-26 DIAGNOSIS — E890 Postprocedural hypothyroidism: Secondary | ICD-10-CM

## 2019-03-26 DIAGNOSIS — E034 Atrophy of thyroid (acquired): Secondary | ICD-10-CM

## 2019-03-30 ENCOUNTER — Other Ambulatory Visit: Payer: Self-pay

## 2019-03-30 ENCOUNTER — Other Ambulatory Visit (INDEPENDENT_AMBULATORY_CARE_PROVIDER_SITE_OTHER): Payer: BC Managed Care – PPO

## 2019-03-30 DIAGNOSIS — E890 Postprocedural hypothyroidism: Secondary | ICD-10-CM

## 2019-03-30 LAB — T4, FREE: Free T4: 1.54 ng/dL (ref 0.60–1.60)

## 2019-03-30 LAB — TSH: TSH: 0.04 u[IU]/mL — ABNORMAL LOW (ref 0.35–4.50)

## 2019-06-05 NOTE — Progress Notes (Deleted)
Name: Kari Gallagher  MRN/ DOB: [id number], 1980/02/03    Age/ Sex: 39 y.o., female     PCP: Olin Hauser, DO   Reason for Endocrinology Evaluation: Hypothyroidism     Initial Endocrinology Clinic Visit: 02/02/2019    PATIENT IDENTIFIER: Ms. Kari Gallagher is a 40 y.o., female with a past medical history of migraine headaches, hypothyroidism and prediabetes.  She has followed with [facility] since 02/02/2019 for consultative assistance with management of her Hypothyroidism.   HISTORICAL SUMMARY: The patient was first diagnosed with  hyperthyroidism in 2014 . She is S/P thyroid uptake and scan in 2014 and in 2016 with 24- hr increase uptake consistent with Graves' Disease.  She is S/P RAI ablation in 2016 followed by hypothyroidism (records not available)   She has been on LT- 4 replacement.    SUBJECTIVE:   Today (06/08/2019):  [name] is here for a follow up on hypothyroidism.    ROS:  As per HPI.   HISTORY:  Past Medical History:  Past Medical History:  Diagnosis Date  . Frequent headaches   . Hypothyroidism   . Leaky heart valve     Past Surgical History:  Past Surgical History:  Procedure Laterality Date  . CHOLECYSTECTOMY    . TUBAL LIGATION  2006     Social History:  reports that she quit smoking about 14 months ago. Her smoking use included cigarettes. She smoked 0.50 packs per day. She has quit using smokeless tobacco. She reports that she does not drink alcohol or use drugs. Family History:  Family History  Problem Relation Age of Onset  . Stroke Mother   . Depression Mother   . Mental illness Mother   . Heart disease Father   . Thyroid disease Father   . Lung cancer Father   . Thyroid disease Brother       HOME MEDICATIONS: Allergies as of 06/08/2019   No Known Allergies     Medication List       Accurate as of June 08, 2019  7:25 AM. If you have any questions, ask your nurse or doctor.          aerochamber plus with mask inhaler Use as instructed   albuterol 108 (90 Base) MCG/ACT inhaler Commonly known as: VENTOLIN HFA Inhale 2 puffs into the lungs every 2 (two) hours as needed for wheezing or shortness of breath (cough).   levothyroxine 175 MCG tablet Commonly known as: SYNTHROID Take 1 tablet (175 mcg total) by mouth daily before breakfast.   multivitamin with minerals Tabs tablet Take 1 tablet by mouth daily.   promethazine-dextromethorphan 6.25-15 MG/5ML syrup Commonly known as: PROMETHAZINE-DM Take 5 mLs by mouth 4 (four) times daily as needed for cough.   traZODone 100 MG tablet Commonly known as: DESYREL Take 1 tablet (100 mg total) by mouth at bedtime. Start with half tablet (50mg ) for up to 1 week, then increase to 1 whole tablet         OBJECTIVE:   PHYSICAL EXAM: VS: There were no vitals taken for this visit.   EXAM: General: Pt appears well and is in NAD  Neck: General: Supple without adenopathy. Thyroid: No goiter or nodules appreciated  Lungs: Clear with good BS bilat with no rales, rhonchi, or wheezes  Heart: Auscultation: RRR.  Abdomen: Normoactive bowel sounds, soft, nontender, without masses or organomegaly palpable  Extremities:  BL LE: No pretibial edema normal ROM and strength.  Skin: Hair: Texture  and amount normal with gender appropriate distribution Skin Inspection: No rashes Skin Palpation: Skin temperature, texture, and thickness normal to palpation  Mental Status: Judgment, insight: Intact Orientation: Oriented to time, place, and person Mood and affect: No depression, anxiety, or agitation     DATA REVIEWED: ***    ASSESSMENT / PLAN / RECOMMENDATIONS:   1. Postablative hypothyroidism    - Pt is clinically and biochemically euthyroid  - Her non-specific symptoms are not related to thyroid given recent normalization of TFT's - Pt educated extensively on the correct way to take levothyroxine (first thing in the  morning with water, 30 minutes before eating or taking other medications). - Pt encouraged to double dose the following day if she were to miss a dose given long half-life of levothyroxine.   Medications :  Continue Levothyroxine 175 mcg daily   2. Grave's Disease:   No extra thyroidal manifestations of graves' disease    Signed electronically by: Lyndle Herrlich, MD  [facility] Endocrinology  [facility] [address] [place] [place], [place] [address] Phone: [phone] FAX: [phone]      CC: Smitty Cords, DO [address] [place] [address] Phone: [phone]  Fax: [phone]   Return to Endocrinology clinic as below: Future Appointments  Date Time Provider Department Center  06/08/2019  7:30 AM Jaylin Roundy, Konrad Dolores, MD [facility] None

## 2019-06-08 ENCOUNTER — Ambulatory Visit: Payer: Medicaid Other | Admitting: Internal Medicine

## 2019-06-12 ENCOUNTER — Encounter: Payer: Self-pay | Admitting: Internal Medicine

## 2019-06-12 ENCOUNTER — Other Ambulatory Visit: Payer: Self-pay

## 2019-06-12 ENCOUNTER — Ambulatory Visit (INDEPENDENT_AMBULATORY_CARE_PROVIDER_SITE_OTHER): Payer: BC Managed Care – PPO | Admitting: Internal Medicine

## 2019-06-12 VITALS — BP 118/78 | HR 88 | Ht 65.0 in | Wt 199.8 lb

## 2019-06-12 DIAGNOSIS — E559 Vitamin D deficiency, unspecified: Secondary | ICD-10-CM

## 2019-06-12 DIAGNOSIS — E890 Postprocedural hypothyroidism: Secondary | ICD-10-CM

## 2019-06-12 DIAGNOSIS — E034 Atrophy of thyroid (acquired): Secondary | ICD-10-CM

## 2019-06-12 DIAGNOSIS — E0500 Thyrotoxicosis with diffuse goiter without thyrotoxic crisis or storm: Secondary | ICD-10-CM

## 2019-06-12 LAB — TSH: TSH: 3.12 u[IU]/mL (ref 0.35–4.50)

## 2019-06-12 LAB — VITAMIN D 25 HYDROXY (VIT D DEFICIENCY, FRACTURES): VITD: 30.7 ng/mL (ref 30.00–100.00)

## 2019-06-12 LAB — T4, FREE: Free T4: 1.29 ng/dL (ref 0.60–1.60)

## 2019-06-12 MED ORDER — LEVOTHYROXINE SODIUM 175 MCG PO TABS: 175.0000 ug | ORAL_TABLET | Freq: Every day | ORAL | 3 refills | Status: DC

## 2019-06-12 NOTE — Patient Instructions (Signed)

## 2019-06-12 NOTE — Progress Notes (Signed)
Name: Kari Gallagher  MRN/ DOB: [id number], 1980/08/01    Age/ Sex: 39 y.o., female     PCP: Smitty Cords, DO   Reason for Endocrinology Evaluation: Hypothyroidism     Initial Endocrinology Clinic Visit: 02/02/2019    PATIENT IDENTIFIER: Kari Gallagher is a 39 y.o., female with a past medical history of migraine headaches, hypothyroidism and prediabetes.  She has followed with [facility] since 02/02/2019 for consultative assistance with management of her Hypothyroidism.   HISTORICAL SUMMARY: The patient was first diagnosed with  hyperthyroidism in 2014 . She is S/P thyroid uptake and scan in 2014 and in 2016 with 24- hr increase uptake consistent with Graves' Disease.  She is S/P RAI ablation in 2016 followed by hypothyroidism (records not available)   She has been on LT- 4 replacement.    SUBJECTIVE:   Today (06/12/2019):  Kari Gallagher is here for a follow up on hypothyroidism.   She has been compliant with levothyroxine intake since using the "pill box" she takes half a tablet on Sundays and 1 tablet the rest of the week.  She has noted weight gain, LE swelling, tenderness over the shins as well as hair loss.  She denies any constipation     ROS:  As per HPI.   HISTORY:  Past Medical History:  Past Medical History:  Diagnosis Date  . Frequent headaches   . Hypothyroidism   . Leaky heart valve    Past Surgical History:  Past Surgical History:  Procedure Laterality Date  . CHOLECYSTECTOMY    . TUBAL LIGATION  2006    Social History:  reports that she quit smoking about 14 months ago. Her smoking use included cigarettes. She smoked 0.50 packs per day. She has quit using smokeless tobacco. She reports that she does not drink alcohol and does not use drugs. Family History:  Family History  Problem Relation Age of Onset  . Stroke Mother   . Depression Mother   . Mental illness Mother   . Heart disease Father   . Thyroid disease  Father   . Lung cancer Father   . Thyroid disease Brother      HOME MEDICATIONS: Allergies as of 06/12/2019   No Known Allergies     Medication List       Accurate as of June 12, 2019 12:49 PM. If you have any questions, ask your nurse or doctor.        aerochamber plus with mask inhaler Use as instructed   albuterol 108 (90 Base) MCG/ACT inhaler Commonly known as: VENTOLIN HFA Inhale 2 puffs into the lungs every 2 (two) hours as needed for wheezing or shortness of breath (cough).   levothyroxine 175 MCG tablet Commonly known as: SYNTHROID Take 1 tablet (175 mcg total) by mouth daily before breakfast.   multivitamin with minerals Tabs tablet Take 1 tablet by mouth daily.   promethazine-dextromethorphan 6.25-15 MG/5ML syrup Commonly known as: PROMETHAZINE-DM Take 5 mLs by mouth 4 (four) times daily as needed for cough.   traZODone 100 MG tablet Commonly known as: DESYREL Take 1 tablet (100 mg total) by mouth at bedtime. Start with half tablet (50mg ) for up to 1 week, then increase to 1 whole tablet         OBJECTIVE:   PHYSICAL EXAM: VS: BP 118/78 (BP Location: Left Arm, Patient Position: Sitting, Cuff Size: Normal)   Pulse 88   Ht 5\' 5"  (1.651 m)   Wt 199  lb 12.8 oz (90.6 kg)   LMP 06/04/2019 (Exact Date)   SpO2 99%   BMI 33.25 kg/m    EXAM: General: Pt appears well and is in NAD  Neck: General: Supple without adenopathy. Thyroid: No goiter or nodules appreciated  Lungs: Clear with good BS bilat with no rales, rhonchi, or wheezes  Heart: Auscultation: RRR.  Abdomen: Normoactive bowel sounds, soft, nontender, without masses or organomegaly palpable  Extremities:  BL LE: Trace pretibial edema  Mental Status: Judgment, insight: Intact Orientation: Oriented to time, place, and person Mood and affect: No depression, anxiety, or agitation     DATA REVIEWED: Results for Kari, Gallagher (MRN [id number]) as of 06/12/2019 16:30  Ref. Range 06/12/2019  09:50  TSH Latest Ref Range: 0.35 - 4.50 uIU/mL 3.12  T4,Free(Direct) Latest Ref Range: 0.60 - 1.60 ng/dL 1.29      ASSESSMENT / PLAN / RECOMMENDATIONS:   1. Postablative hypothyroidism   - Repeat TFT's are normal  - Pt educated extensively on the correct way to take levothyroxine (first thing in the morning with water, 30 minutes before eating or taking other medications). - Pt encouraged to double dose the following day if she were to miss a dose given long half-life of levothyroxine.   Medications :  Continue Levothyroxine 175 mcg daily except sundays to take half a tablet     2. Grave's Disease:   No extra thyroidal manifestations of graves' disease   3. Vitamin D deficiency:  - She has been diagnosed with vitamin D deficiency in the past with a Vitamin D level of 14.4 ng/dMl in 02/2018 - Repeat vitamin D is normal today    Continue Vitamin D3 2000 iu daily   4. Lower Extremity Edema :  - We had discussed low salt diet and compression stockings   F/U in 4 months    Signed electronically by: Mack Guise, MD  [facility] Endocrinology  [facility] [address] [place], [place] [address] Phone: [phone] FAX: [phone]      CC: Olin Hauser, DO [place] [address] Phone: [phone]  Fax: [phone]   Return to Endocrinology clinic as below: Future Appointments  Date Time Provider [place]  10/16/2019  9:30 AM Braden Cimo, Melanie Crazier, MD [facility] None

## 2019-06-23 ENCOUNTER — Encounter: Payer: Self-pay | Admitting: Family Medicine

## 2019-06-23 ENCOUNTER — Ambulatory Visit (INDEPENDENT_AMBULATORY_CARE_PROVIDER_SITE_OTHER): Payer: BC Managed Care – PPO | Admitting: Family Medicine

## 2019-06-23 ENCOUNTER — Other Ambulatory Visit: Payer: Self-pay

## 2019-06-23 VITALS — BP 107/62 | HR 92 | Temp 97.1°F | Resp 16 | Ht 65.0 in | Wt 198.6 lb

## 2019-06-23 DIAGNOSIS — E034 Atrophy of thyroid (acquired): Secondary | ICD-10-CM | POA: Diagnosis not present

## 2019-06-23 DIAGNOSIS — G4701 Insomnia due to medical condition: Secondary | ICD-10-CM | POA: Diagnosis not present

## 2019-06-23 DIAGNOSIS — R609 Edema, unspecified: Secondary | ICD-10-CM

## 2019-06-23 DIAGNOSIS — F3342 Major depressive disorder, recurrent, in full remission: Secondary | ICD-10-CM | POA: Diagnosis not present

## 2019-06-23 DIAGNOSIS — I890 Lymphedema, not elsewhere classified: Secondary | ICD-10-CM | POA: Diagnosis not present

## 2019-06-23 MED ORDER — FUROSEMIDE 20 MG PO TABS: 20.0000 mg | ORAL_TABLET | Freq: Every day | ORAL | 2 refills | Status: DC | PRN

## 2019-06-23 MED ORDER — POTASSIUM CHLORIDE CRYS ER 10 MEQ PO TBCR: 10.0000 meq | EXTENDED_RELEASE_TABLET | Freq: Every day | ORAL | 2 refills | Status: DC | PRN

## 2019-06-23 NOTE — Patient Instructions (Addendum)
Thank you for coming to the office today.  Likely lymphedema causing swelling. Also if veins weaker with insufficiency the valves cannot keep the fluid from going back down the leg with gravity.  Try fluid pill furosemide 20mg  - once daily AS NEEDED ONLY for swelling, can take potassium pill WITH it. Try to only take it 2-3 times a week, if truly needed more can take 2 pills a day but should follow up with me sooner May need to check blood potassium level  Use RICE therapy: - R - Rest / relative rest with activity modification avoid overuse of joint - I - Ice packs (make sure you use a towel or sock / something to protect skin) - C - Compression with compression stockings or ACE wrap to apply pressure and reduce swelling allowing more support - E - Elevation - if significant swelling, lift leg above heart level (toes above your nose) to help reduce swelling, most helpful at night after day of being on your feet  Referral to Vascular Vein Specialist [facility] - stay tuned for apt.      Lymphedema  Lymphedema is swelling that is caused by the abnormal collection of lymph in the tissues under the skin. Lymph is fluid from the tissues in your body that is removed through the lymphatic system. This system is part of your body's defense system (immune system) and includes lymph nodes and lymph vessels. The lymph vessels collect and carry the excess fluid, fats, proteins, and wastes from the tissues of the body to the bloodstream. This system also works to clean and remove bacteria and waste products from the body. Lymphedema occurs when the lymphatic system is blocked. When the lymph vessels or lymph nodes are blocked or damaged, lymph does not drain properly. This causes an abnormal buildup of lymph, which leads to swelling in the affected area. This may include the trunk area, or an arm or leg. Lymphedema cannot be cured by medicines, but various methods can be used to help reduce the  swelling. There are two types of lymphedema: primary lymphedema and secondary lymphedema. What are the causes? The cause of this condition depends on the type of lymphedema that you have.  Primary lymphedema is caused by the absence of lymph vessels or having abnormal lymph vessels at birth.  Secondary lymphedema occurs when lymph vessels are blocked or damaged. Secondary lymphedema is more common. Common causes of lymph vessel blockage include: ? Skin infection, such as cellulitis. ? Infection by parasites (filariasis). ? Injury. ? Radiation therapy. ? Cancer. ? Formation of scar tissue. ? Surgery. What are the signs or symptoms? Symptoms of this condition include:  Swelling of the arm or leg.  A heavy or tight feeling in the arm or leg.  Swelling of the feet, toes, or fingers. Shoes or rings may fit more tightly than before.  Redness of the skin over the affected area.  Limited movement of the affected limb.  Sensitivity to touch or discomfort in the affected limb. How is this diagnosed? This condition may be diagnosed based on:  Your symptoms and medical history.  A physical exam.  Bioimpedance spectroscopy. In this test, painless electrical currents are used to measure fluid levels in your body.  Imaging tests, such as: ? Lymphoscintigraphy. In this test, a low dose of a radioactive substance is injected to trace the flow of lymph through the lymph vessels. ? MRI. ? CT scan. ? Duplex ultrasound. This test uses sound waves to produce images  of the vessels and the blood flow on a screen. ? Lymphangiography. In this test, a contrast dye is injected into the lymph vessel to help show blockages. How is this treated? Treatment for this condition may depend on the cause of your lymphedema. Treatment may include:  Complete decongestive therapy (CDT). This is done by a certified lymphedema therapist to reduce fluid congestion. This therapy includes: ? Manual lymph drainage.  This is a special massage technique that promotes lymph drainage out of a limb. ? Skin care. ? Compression wrapping of the affected area. ? Specific exercises. Certain exercises can help fluid move out of the affected limb.  Compression. Various methods may be used to apply pressure to the affected limb to reduce the swelling. They include: ? Wearing compression stockings or sleeves on the affected limb. ? Wrapping the affected limb with special bandages.  Surgery. This is usually done for severe cases only. For example, surgery may be done if you have trouble moving the limb or if the swelling does not get better with other treatments. If an underlying condition is causing the lymphedema, treatment for that condition will be done. For example, antibiotic medicines may be used to treat an infection. Follow these instructions at home: Self-care  The affected area is more likely to become injured or infected. Take these steps to help prevent infection: ? Keep the affected area clean and dry. ? Use approved creams or lotions to keep the skin moisturized. ? Protect your skin from cuts:  Use gloves while cooking or gardening.  Do not walk barefoot.  If you shave the affected area, use an Neurosurgeon.  Do not wear tight clothes, shoes, or jewelry.  Eat a healthy diet that includes a lot of fruits and vegetables. Activity  Exercise regularly as directed by your health care provider.  Do not sit with your legs crossed.  When possible, keep the affected limb raised (elevated) above the level of your heart.  Avoid carrying things with an arm that is affected by lymphedema. General instructions  Wear compression stockings or sleeves as told by your health care provider.  Note any changes in size of the affected limb. You may be instructed to take regular measurements and keep track of them.  Take over-the-counter and prescription medicines only as told by your health care  provider.  If you were prescribed an antibiotic medicine, take or apply it as told by your health care provider. Do not stop using the antibiotic even if you start to feel better.  Do not use heating pads or ice packs over the affected area.  Avoid having blood draws, IV insertions, or blood pressure checked on the affected limb.  Keep all follow-up visits as told by your health care provider. This is important. Contact a health care provider if you:  Continue to have swelling in your limb.  Have a cut that does not heal.  Have redness or pain in the affected area. Get help right away if you:  Have new swelling in your limb that comes on suddenly.  Develop purplish spots, rash or sores (lesions) on your affected limb.  Have shortness of breath.  Have a fever or chills. Summary  Lymphedema is swelling that is caused by the abnormal collection of lymph in the tissues under the skin.  Lymph is fluid from the tissues in your body that is removed through the lymphatic system. This system collects and carries excess fluid, fats, proteins, and wastes from the tissues  of the body to the bloodstream.  Lymphedema causes swelling, pain, and redness in the affected area. This may include the trunk area, or an arm or leg.  Treatment for this condition may depend on the cause of your lymphedema. Treatment may include complete decongestive therapy (CDT), compression methods, surgery, or treating the underlying cause. This information is not intended to replace advice given to you by your health care provider. Make sure you discuss any questions you have with your health care provider. Document Revised: 12/31/2016 Document Reviewed: 12/31/2016 Elsevier Patient Education  2020 [employer].      Please schedule a Follow-up Appointment to: Return in about 6 weeks (around 08/04/2019) for 6 weeks as needed for swelling / lymphedema.  If you have any other questions or concerns, please feel  free to call the office or send a message through MyChart. You may also schedule an earlier appointment if necessary.  Additionally, you may be receiving a survey about your experience at our office within a few days to 1 week by e-mail or mail. We value your feedback.  [name], DO [facility] [place], [place]

## 2019-06-23 NOTE — Assessment & Plan Note (Signed)
Episodic worsening of chronic problem bilateral LE with lymphedema on exam and history Fam history of similar lymphedema Years for bilateral leg swelling Temporary improve with RICE therapy and conservative measures  Trial back on Furosemide PRN 20mg  - infrequent use advised, may take with K supplement  Referral to [facility]) for further consultation imaging ultrasound and further management

## 2019-06-23 NOTE — Progress Notes (Signed)
Subjective:    Patient ID: Kari Gallagher, female    DOB: March 11, 1980, 39 y.o.   MRN: [id number]  Kari Gallagher is a 39 y.o. female presenting on 06/23/2019 for Edema   HPI   Bilateral Lower Extremity Edema Chronic Venous insufficiency vs Lymphedema - Reports chronic issue episodic problem with bilateral lower extremity swelling feet ankles and lower legs over past several years. She describes worse with warmer weather hot temperatures, increased activity walking / stairs etc, also admits some painful at times with inc activity. Seems to improve overnight with rest and elevation then worse again during day. Says it never 100% goes away. She has strong family history of lymphedema with similar problems in other relatives. - In past she was on furosemide PRN with some good results - No prior vascular evaluation for lymphedema Denies any leg redness or warmth or laceration or injury  Hypothyroidism Followed by Dr Lonzo Cloud [facility] Endocrinology [facility]) Last visit 06/12/19 -had normal range thyroid panel and Vitamin D level Continued on Levothyroxine daily Doing well, feels her energy and overall mood is improved now thyroid controlled  PMH younger brother sarcoidosis  INSOMNIA Chronic problem. Previously Shift Work Sleep Disorder, was working variable shifts and 2nd shift, had disrupted sleep schedule. She has been on medications in past with Trazodone and Ambien in past with mixed results. - Now for past 1 year during COVID no longer working, and she has maintained a more normal sleep schedule and has done much better. - She feels more rested and no longer has depression symptoms - She takes Trazodone 100mg  PRN ONLY rarely taking now.  Major Depression, Recurrent, in remission Known history of depression and some mixed mood / anxiety Also on SSRI in past. Now only PRN Trazodone - See above history on insomnia - Denies agitation, panic attacks, suicidal or  homicidal ideation, anxiety   Depression screen [facility] 2/9 06/23/2019 10/15/2018 07/16/2018  Decreased Interest 0 1 2  Down, Depressed, Hopeless 0 1 2  PHQ - 2 Score 0 2 4  Altered sleeping 0 3 3  Tired, decreased energy 1 3 3   Change in appetite 0 0 3  Feeling bad or failure about yourself  0 0 2  Trouble concentrating 0 1 1  Moving slowly or fidgety/restless 0 1 3  Suicidal thoughts 0 0 0  PHQ-9 Score 1 10 19   Difficult doing work/chores Not difficult at all - Not difficult at all  Some recent data might be hidden    Social History   Tobacco Use  . Smoking status: Former Smoker    Packs/day: 0.50    Types: Cigarettes    Quit date: 04/02/2018    Years since quitting: 1.2  . Smokeless tobacco: Former  . Vaping Use: Never used  Substance Use Topics  . Alcohol use: No  . Drug use: No    Review of Systems Per HPI unless specifically indicated above     Objective:    BP 107/62   Pulse 92   Temp (!) 97.1 F (36.2 C) (Temporal)   Resp 16   Ht 5\' 5"  (1.651 m)   Wt 198 lb 9.6 oz (90.1 kg)   LMP 06/04/2019 (Exact Date)   SpO2 100%   BMI 33.05 kg/m   Wt Readings from Last 3 Encounters:  06/23/19 198 lb 9.6 oz (90.1 kg)  06/12/19 199 lb 12.8 oz (90.6 kg)  03/14/19 190 lb (86.2 kg)  Physical Exam Vitals and nursing note reviewed.  Constitutional:      General: She is not in acute distress.    Appearance: She is well-developed. She is not diaphoretic.     Comments: Well-appearing, comfortable, cooperative  HENT:     Head: Normocephalic and atraumatic.  Eyes:     General:        Right eye: No discharge.        Left eye: No discharge.     Conjunctiva/sclera: Conjunctivae normal.  Neck:     Thyroid: No thyromegaly.  Cardiovascular:     Rate and Rhythm: Normal rate and regular rhythm.     Heart sounds: Normal heart sounds. No murmur heard.   Pulmonary:     Effort: Pulmonary effort is normal. No respiratory distress.     Breath sounds: Normal  breath sounds. No wheezing or rales.  Musculoskeletal:        General: Normal range of motion.     Cervical back: Normal range of motion and neck supple.     Right lower leg: Edema present.     Left lower leg: Edema (non pitting generalized edema +1-2 bilateral lower extremity) present.     Comments: Non tender, no erythema  Lymphadenopathy:     Cervical: No cervical adenopathy.  Skin:    General: Skin is warm and dry.     Findings: No erythema or rash.  Neurological:     Mental Status: She is alert and oriented to person, place, and time.  Psychiatric:        Behavior: Behavior normal.     Comments: Well groomed, good eye contact, normal speech and thoughts       Results for orders placed or performed in visit on 06/12/19  VITAMIN D 25 Hydroxy (Vit-D Deficiency, Fractures)  Result Value Ref Range   VITD 30.70 30.00 - 100.00 ng/mL  TSH  Result Value Ref Range   TSH 3.12 0.35 - 4.50 uIU/mL  T4, free  Result Value Ref Range   Free T4 1.29 0.60 - 1.60 ng/dL      Assessment & Plan:   Problem List Items Addressed This Visit    Peripheral edema   Relevant Orders   Ambulatory referral to Vascular Surgery   Major depression in full remission ([place])    Resolved now in remission Had secondary insomnia related to shift work and mood / thyroid Now no longer issue with improved sleep pattern On Trazodone PRN only insomnia NO longer on SSRI      Relevant Medications   traZODone (DESYREL) 100 MG tablet   Lymphedema of both lower extremities - Primary    Episodic worsening of chronic problem bilateral LE with lymphedema on exam and history Fam history of similar lymphedema Years for bilateral leg swelling Temporary improve with RICE therapy and conservative measures  Trial back on Furosemide PRN 20mg  - infrequent use advised, may take with K supplement  Referral to Vascular [facility]) for further consultation imaging ultrasound and further management      Relevant  Medications   furosemide (LASIX) 20 MG tablet   potassium chloride SA (KLOR-CON) 10 MEQ tablet   Other Relevant Orders   Ambulatory referral to Vascular Surgery   Insomnia    Dramatic improvement now no longer on 2nd shift work She is on normal day routine and not working at this time Sleep has improved overall and mood has improved since her thyroid is controlled On Trazodone PRN ONLY rarely taking No longer  on SSRI      Relevant Medications   traZODone (DESYREL) 100 MG tablet   Hypothyroidism    Followed by [facility] Controlled on last lab panel on Levothyroxine daily           Orders Placed This Encounter  Procedures  . Ambulatory referral to Vascular Surgery    Referral Priority:   Routine    Referral Type:   Surgical    Referral Reason:   Specialty Services Required    Requested Specialty:   Vascular Surgery    Number of Visits Requested:   1     Meds ordered this encounter  Medications  . furosemide (LASIX) 20 MG tablet    Sig: Take 1 tablet (20 mg total) by mouth daily as needed for fluid or edema.    Dispense:  30 tablet    Refill:  2  . potassium chloride SA (KLOR-CON) 10 MEQ tablet    Sig: Take 1 tablet (10 mEq total) by mouth daily as needed (take with fluid pill).    Dispense:  30 tablet    Refill:  2      Follow up plan: Return in about 6 weeks (around 08/04/2019) for 6 weeks as needed for swelling / lymphedema.   [name], DO [facility] 06/23/2019, 9:03 AM

## 2019-06-23 NOTE — Assessment & Plan Note (Signed)
Resolved now in remission Had secondary insomnia related to shift work and mood / thyroid Now no longer issue with improved sleep pattern On Trazodone PRN only insomnia NO longer on SSRI

## 2019-06-23 NOTE — Assessment & Plan Note (Signed)
Dramatic improvement now no longer on 2nd shift work She is on normal day routine and not working at this time Sleep has improved overall and mood has improved since her thyroid is controlled On Trazodone PRN ONLY rarely taking No longer on SSRI

## 2019-06-23 NOTE — Assessment & Plan Note (Signed)
Followed by [facility] Endocrinology [place] Controlled on last lab panel on Levothyroxine daily

## 2019-08-12 ENCOUNTER — Other Ambulatory Visit: Payer: Self-pay

## 2019-08-12 DIAGNOSIS — R609 Edema, unspecified: Secondary | ICD-10-CM

## 2019-08-27 ENCOUNTER — Ambulatory Visit (INDEPENDENT_AMBULATORY_CARE_PROVIDER_SITE_OTHER): Payer: BC Managed Care – PPO | Admitting: Physician Assistant

## 2019-08-27 ENCOUNTER — Ambulatory Visit (HOSPITAL_COMMUNITY)
Admission: RE | Admit: 2019-08-27 | Discharge: 2019-08-27 | Disposition: A | Discharge status: Home / Self Care | Payer: BC Managed Care – PPO | Source: Ambulatory Visit | Attending: Vascular Surgery | Admitting: Vascular Surgery

## 2019-08-27 ENCOUNTER — Other Ambulatory Visit: Payer: Self-pay

## 2019-08-27 VITALS — BP 115/88 | HR 80 | Temp 98.7°F | Resp 20 | Ht 65.0 in | Wt 192.7 lb

## 2019-08-27 DIAGNOSIS — R609 Edema, unspecified: Secondary | ICD-10-CM | POA: Insufficient documentation

## 2019-08-27 DIAGNOSIS — M7989 Other specified soft tissue disorders: Secondary | ICD-10-CM

## 2019-08-27 DIAGNOSIS — I872 Venous insufficiency (chronic) (peripheral): Secondary | ICD-10-CM | POA: Diagnosis not present

## 2019-08-27 NOTE — Progress Notes (Signed)
Office Note     CC:  follow up Requesting Provider:  [name] *  HPI: Kari Gallagher is a 39 y.o. (03-Apr-1980) female who presents for evaluation of BLE edema and cramping at night.  She has been experiencing edema of both legs since high school.  She has a family history of lymphedema on both sides of her family.  Cramping has only been occurring in the last year.  She denies any history of DVT, venous ulcers, vascular interventions or trauma.  Edema worsens after being on her feet during the day.  Edema has improved some since she was put on a diuretic by her PCP.  She has tried knee high compression in the past because she felt they were cutting into her legs and effecting her circulation.  She is a former smoker.  Past Medical History:  Diagnosis Date  . Frequent headaches   . Hypothyroidism   . Leaky heart valve     Past Surgical History:  Procedure Laterality Date  . CHOLECYSTECTOMY    . TUBAL LIGATION  2006    Social History   Socioeconomic History  . Marital status: Married    Spouse name: Not on file  . Number of children: 3  . Years of education: Not on file  . Highest education level: Not on file  Occupational History  . Not on file  Tobacco Use  . Smoking status: Former Smoker    Packs/day: 0.50    Types: Cigarettes    Quit date: 04/02/2018    Years since quitting: 1.4  . Smokeless tobacco: Former Clinical biochemist  . Vaping Use: Never used  Substance and Sexual Activity  . Alcohol use: No  . Drug use: No  . Sexual activity: Not on file  Other Topics Concern  . Not on file  Social History Narrative  . Not on file   Social Determinants of Health   Financial Resource Strain:   . Difficulty of Paying Living Expenses: Not on file  Food Insecurity:   . Worried About Programme researcher, broadcasting/film/video in the Last Year: Not on file  . Ran Out of Food in the Last Year: Not on file  Transportation Needs:   . Lack of Transportation (Medical): Not on file  .  Lack of Transportation (Non-Medical): Not on file  Physical Activity:   . Days of Exercise per Week: Not on file  . Minutes of Exercise per Session: Not on file  Stress:   . Feeling of Stress : Not on file  Social Connections:   . Frequency of Communication with Friends and Family: Not on file  . Frequency of Social Gatherings with Friends and Family: Not on file  . Attends Religious Services: Not on file  . Active Member of Clubs or Organizations: Not on file  . Attends Banker Meetings: Not on file  . Marital Status: Not on file  Intimate Partner Violence:   . Fear of Current or Ex-Partner: Not on file  . Emotionally Abused: Not on file  . Physically Abused: Not on file  . Sexually Abused: Not on file    Family History  Problem Relation Age of Onset  . Stroke Mother   . Depression Mother   . Mental illness Mother   . Heart disease Father   . Thyroid disease Father   . Lung cancer Father   . Thyroid disease Brother     Current Outpatient Medications  Medication Sig Dispense Refill  .  furosemide (LASIX) 20 MG tablet Take 1 tablet (20 mg total) by mouth daily as needed for fluid or edema. 30 tablet 2  . levothyroxine (SYNTHROID) 175 MCG tablet Take 1 tablet (175 mcg total) by mouth daily before breakfast. 78 tablet 3  . Multiple Vitamin (MULTIVITAMIN WITH MINERALS) TABS tablet Take 1 tablet by mouth daily.    . potassium chloride (KLOR-CON) 10 MEQ tablet Take 10 mEq by mouth daily as needed.    . traZODone (DESYREL) 100 MG tablet Take 1 tablet (100 mg total) by mouth at bedtime as needed for sleep. Start with half tablet (50mg ) for up to 1 week, then increase to 1 whole tablet 30 tablet 5   No current facility-administered medications for this visit.    No Known Allergies   REVIEW OF SYSTEMS:   [X]  denotes positive finding, [ ]  denotes negative finding Cardiac  Comments:  Chest pain or chest pressure:    Shortness of breath upon exertion:    Short of  breath when lying flat:    Irregular heart rhythm:        Vascular    Pain in calf, thigh, or hip brought on by ambulation:    Pain in feet at night that wakes you up from your sleep:     Blood clot in your veins:    Leg swelling:         Pulmonary    Oxygen at home:    Productive cough:     Wheezing:         Neurologic    Sudden weakness in arms or legs:     Sudden numbness in arms or legs:     Sudden onset of difficulty speaking or slurred speech:    Temporary loss of vision in one eye:     Problems with dizziness:         Gastrointestinal    Blood in stool:     Vomited blood:         Genitourinary    Burning when urinating:     Blood in urine:        Psychiatric    Major depression:         Hematologic    Bleeding problems:    Problems with blood clotting too easily:        Skin    Rashes or ulcers:        Constitutional    Fever or chills:      PHYSICAL EXAMINATION:  Vitals:   08/27/19 1138  BP: 115/88  Pulse: 80  Resp: 20  Temp: 98.7 F (37.1 C)  TempSrc: Temporal  SpO2: 99%  Weight: 192 lb 11.2 oz (87.4 kg)  Height: 5\' 5"  (1.651 m)    General:  WDWN in NAD; vital signs documented above Gait: Not observed HENT: WNL, normocephalic Pulmonary: normal non-labored breathing , without Rales, rhonchi,  wheezing Cardiac: regular HR Abdomen: soft, NT, no masses Skin: without rashes Vascular Exam/Pulses:  Right Left  Radial 2+ (normal) 2+ (normal)  Ulnar absent absent  Femoral 2+ (normal) 2+ (normal)  Popliteal absent absent  DP 1+ (weak) 1+ (weak)   Extremities: without ischemic changes, without Gangrene , without cellulitis; without open wounds; no venous ulcerations or varicosities noted on exam today; edema of dorsal foot and ankles noted bilaterally Musculoskeletal: no muscle wasting or atrophy  Neurologic: A&O X 3;  No focal weakness or paresthesias are detected Psychiatric:  The pt has Normal affect.   Non-Invasive  Vascular Imaging:    Venous Reflux BLE Negative for DVT of BLE Reflux noted in R CFV Negative for R GSV reflux  Reflux noted in L CFV Reflux noted in distal thigh and proximal calf GSV    ASSESSMENT/PLAN:: 39 y.o. female here for evaluation of BLE edema and occasional cramping at night  Venous relfux study negative for DVT Patient did have CFV reflux of BLE and minimal L GSV reflux however not a candidate for laser ablation She was measured for and recommended thigh high compression to be worn daily as she was unable to tolerate knee high compression in the past due to discomfort from indentation Also encouraged elevation of BLE when possible during the day Unsure of night cramping etiology but will defer to PCP to monitor electrolytes Nothing further to add from a vascular surgery standpoint; she may follow up on an as needed basis   Emilie Rutter, PA-C [facility] [phone]  Clinic MD:   Darrick Penna

## 2019-09-14 DIAGNOSIS — Z113 Encounter for screening for infections with a predominantly sexual mode of transmission: Secondary | ICD-10-CM | POA: Diagnosis not present

## 2019-09-14 DIAGNOSIS — Z202 Contact with and (suspected) exposure to infections with a predominantly sexual mode of transmission: Secondary | ICD-10-CM | POA: Diagnosis not present

## 2019-10-16 ENCOUNTER — Ambulatory Visit: Payer: BC Managed Care – PPO | Admitting: Internal Medicine

## 2019-10-16 NOTE — Progress Notes (Deleted)
Name: ZAIRAH ARISTA  MRN/ DOB: [id number], 07-03-1980    Age/ Sex: 39 y.o., female     PCP: Smitty Cords, DO   Reason for Endocrinology Evaluation: Hypothyroidism     Initial Endocrinology Clinic Visit: 02/02/2019    PATIENT IDENTIFIER: Kari Gallagher is a 39 y.o., female with a past medical history of migraine headaches, hypothyroidism and prediabetes.  She has followed with [facility] since 02/02/2019 for consultative assistance with management of her Hypothyroidism.   HISTORICAL SUMMARY: The patient was first diagnosed with  hyperthyroidism in 2014 . She is S/P thyroid uptake and scan in 2014 and in 2016 with 24- hr increase uptake consistent with Graves' Disease.  She is S/P RAI ablation in 2016 followed by hypothyroidism (records not available)   She has been on LT- 4 replacement.    SUBJECTIVE:   Today (10/16/2019):  Ms. Puckett is here for a follow up on hypothyroidism.   She has been compliant with levothyroxine intake since using the "pill box" she takes half a tablet on Sundays and 1 tablet the rest of the week.  She has noted weight gain, LE swelling, tenderness over the shins as well as hair loss.  She denies any constipation     ROS:  As per HPI.   HISTORY:  Past Medical History:  Past Medical History:  Diagnosis Date  . Frequent headaches   . Hypothyroidism   . Leaky heart valve    Past Surgical History:  Past Surgical History:  Procedure Laterality Date  . CHOLECYSTECTOMY    . TUBAL LIGATION  2006    Social History:  reports that she quit smoking about 18 months ago. Her smoking use included cigarettes. She smoked 0.50 packs per day. She has quit using smokeless tobacco. She reports that she does not drink alcohol and does not use drugs. Family History:  Family History  Problem Relation Age of Onset  . Stroke Mother   . Depression Mother   . Mental illness Mother   . Heart disease Father   . Thyroid disease  Father   . Lung cancer Father   . Thyroid disease Brother      HOME MEDICATIONS: Allergies as of 10/16/2019   No Known Allergies     Medication List       Accurate as of October 16, 2019  7:21 AM. If you have any questions, ask your nurse or doctor.        furosemide 20 MG tablet Commonly known as: LASIX Take 1 tablet (20 mg total) by mouth daily as needed for fluid or edema.   levothyroxine 175 MCG tablet Commonly known as: SYNTHROID Take 1 tablet (175 mcg total) by mouth daily before breakfast.   multivitamin with minerals Tabs tablet Take 1 tablet by mouth daily.   potassium chloride 10 MEQ tablet Commonly known as: KLOR-CON Take 10 mEq by mouth daily as needed.   traZODone 100 MG tablet Commonly known as: DESYREL Take 1 tablet (100 mg total) by mouth at bedtime as needed for sleep. Start with half tablet (50mg ) for up to 1 week, then increase to 1 whole tablet         OBJECTIVE:   PHYSICAL EXAM: VS: There were no vitals taken for this visit.   EXAM: General: Pt appears well and is in NAD  Neck: General: Supple without adenopathy. Thyroid: No goiter or nodules appreciated  Lungs: Clear with good BS bilat with no rales, rhonchi, or  wheezes  Heart: Auscultation: RRR.  Abdomen: Normoactive bowel sounds, soft, nontender, without masses or organomegaly palpable  Extremities:  BL LE: Trace pretibial edema  Mental Status: Judgment, insight: Intact Orientation: Oriented to time, place, and person Mood and affect: No depression, anxiety, or agitation     DATA REVIEWED: Results for JOSEFINA, RYNDERS (MRN [id number]) as of 06/12/2019 16:30  Ref. Range 06/12/2019 09:50  TSH Latest Ref Range: 0.35 - 4.50 uIU/mL 3.12  T4,Free(Direct) Latest Ref Range: 0.60 - 1.60 ng/dL 6.38      ASSESSMENT / PLAN / RECOMMENDATIONS:   1. Postablative hypothyroidism   - Repeat TFT's are normal  - Pt educated extensively on the correct way to take levothyroxine (first  thing in the morning with water, 30 minutes before eating or taking other medications). - Pt encouraged to double dose the following day if she were to miss a dose given long half-life of levothyroxine.   Medications :  Continue Levothyroxine 175 mcg daily except sundays to take half a tablet     2. Grave's Disease:   No extra thyroidal manifestations of graves' disease   3. Vitamin D deficiency:  - She has been diagnosed with vitamin D deficiency in the past with a Vitamin D level of 14.4 ng/dMl in 02/2018 - Repeat vitamin D is normal today    Continue Vitamin D3 2000 iu daily    F/U in 4 months    Signed electronically by: Lyndle Herrlich, MD  [facility]  [facility] [address] [place] [place], [place] [address] Phone: [phone] FAX: [phone]      CC: Smitty Cords, DO [address] [place] [address] Phone: [phone]  Fax: [phone]   Return to Endocrinology clinic as below: Future Appointments  Date Time Provider Department Center  10/16/2019  9:30 AM Darril Patriarca, Konrad Dolores, MD [facility] None  10/30/2019  8:15 AM Marny Lowenstein, PA-C [facility]

## 2019-10-19 ENCOUNTER — Other Ambulatory Visit: Payer: Self-pay

## 2019-10-19 ENCOUNTER — Encounter: Payer: Self-pay | Admitting: Internal Medicine

## 2019-10-19 ENCOUNTER — Ambulatory Visit (INDEPENDENT_AMBULATORY_CARE_PROVIDER_SITE_OTHER): Payer: BC Managed Care – PPO | Admitting: Internal Medicine

## 2019-10-19 VITALS — BP 116/72 | HR 86 | Temp 96.8°F | Ht 65.0 in | Wt 192.0 lb

## 2019-10-19 DIAGNOSIS — E0500 Thyrotoxicosis with diffuse goiter without thyrotoxic crisis or storm: Secondary | ICD-10-CM | POA: Diagnosis not present

## 2019-10-19 DIAGNOSIS — L680 Hirsutism: Secondary | ICD-10-CM

## 2019-10-19 DIAGNOSIS — E890 Postprocedural hypothyroidism: Secondary | ICD-10-CM

## 2019-10-19 LAB — TSH: TSH: 37.06 u[IU]/mL — ABNORMAL HIGH (ref 0.35–4.50)

## 2019-10-19 NOTE — Progress Notes (Signed)
Name: Kari Gallagher  MRN/ DOB: [id number], May 06, 1980    Age/ Sex: 39 y.o., female     PCP: Smitty Cords, DO   Reason for Endocrinology Evaluation: Hypothyroidism     Initial Endocrinology Clinic Visit: 02/02/2019    PATIENT IDENTIFIER: Kari Gallagher is a 39 y.o., female with a past medical history of migraine headaches, hypothyroidism and prediabetes.  She has followed with [facility] since 02/02/2019 for consultative assistance with management of her Hypothyroidism.   HISTORICAL SUMMARY: The patient was first diagnosed with  hyperthyroidism in 2014 . She is S/P thyroid uptake and scan in 2014 and in 2016 with 24- hr increase uptake consistent with Graves' Disease.  She is S/P RAI ablation in 2016 followed by hypothyroidism (records not available)     SUBJECTIVE:   Today (10/19/2019):  Kari Gallagher is here for a follow up on hypothyroidism.   She has been compliant with levothyroxine intake since using the "pill box" she takes half a tablet on Sundays and 1 tablet the rest of the week.    She has multiple complaints today weight loss, cold intolerance, hair loss, facial hirsutism, mood changes to include depression and irritability   Had an episode last week with dizziness, lightheadedness and vomiting, EMS were called and check up was normal    She denies any constipation  Has a Gyn appointment in next few days , her periods have been arriving earlier    HISTORY:  Past Medical History:  Past Medical History:  Diagnosis Date   Frequent headaches    Hypothyroidism    Leaky heart valve    Past Surgical History:  Past Surgical History:  Procedure Laterality Date   CHOLECYSTECTOMY     TUBAL LIGATION  2006    Social History:  reports that she quit smoking about 18 months ago. Her smoking use included cigarettes. She smoked 0.50 packs per day. She has quit using smokeless tobacco. She reports that she does not drink alcohol and  does not use drugs. Family History:  Family History  Problem Relation Age of Onset   Stroke Mother    Depression Mother    Mental illness Mother    Heart disease Father    Thyroid disease Father    Lung cancer Father    Thyroid disease Brother      HOME MEDICATIONS: Allergies as of 10/19/2019   No Known Allergies     Medication List       Accurate as of October 19, 2019  7:30 AM. If you have any questions, ask your nurse or doctor.        furosemide 20 MG tablet Commonly known as: LASIX Take 1 tablet (20 mg total) by mouth daily as needed for fluid or edema.   levothyroxine 175 MCG tablet Commonly known as: SYNTHROID Take 1 tablet (175 mcg total) by mouth daily before breakfast.   multivitamin with minerals Tabs tablet Take 1 tablet by mouth daily.   potassium chloride 10 MEQ tablet Commonly known as: KLOR-CON Take 10 mEq by mouth daily as needed.   traZODone 100 MG tablet Commonly known as: DESYREL Take 1 tablet (100 mg total) by mouth at bedtime as needed for sleep. Start with half tablet (50mg ) for up to 1 week, then increase to 1 whole tablet         OBJECTIVE:   PHYSICAL EXAM: VS:BP 116/72    Pulse 86    Temp (!) 96.8 F (36 C) (  Temporal)    Ht 5\' 5"  (1.651 m)    Wt 192 lb (87.1 kg)    LMP 10/14/2019    SpO2 99%    BMI 31.95 kg/m     EXAM: General: Pt appears well and is in NAD Thick dark hairs noted aver the beard/chin area   Neck: General: Supple without adenopathy. Thyroid: No goiter or nodules appreciated  Lungs: Clear with good BS bilat with no rales, rhonchi, or wheezes  Heart: Auscultation: RRR.  Abdomen: Normoactive bowel sounds, soft, nontender, without masses or organomegaly palpable  Extremities:  BL LE: Trace pretibial edema  Mental Status: Judgment, insight: Intact Orientation: Oriented to time, place, and person Mood and affect: No depression, anxiety, or agitation     DATA REVIEWED:  Results for Kari Gallagher  (MRN Kari Gallagher) as of 10/20/2019 13:18  Ref. Range 03/30/2019 08:31 06/12/2019 09:50 10/19/2019 11:21  TSH Latest Ref Range: 0.35 - 4.50 uIU/mL 0.04 (L) 3.12 37.06 (H)  T4,Free(Direct) Latest Ref Range: 0.60 - 1.60 ng/dL 10/21/2019 9.38     Results for Kari Gallagher (MRN Kari Gallagher) as of 10/22/2019 13:25  Ref. Range 10/19/2019 11:21  Testosterone, Total, LC-MS-MS Latest Ref Range: 2 - 45 ng/dL 19    ASSESSMENT / PLAN / RECOMMENDATIONS:   1. Postablative hypothyroidism   -  Pt with multiple non-specific symptoms  - Her TSH went from 3.12 uIU/mL to 37.06 uIU/mL on the same EXACT dose of levothyroxine, this is only explained by non-compliance  - I have confirmed the above with [facility] listed on her chart and they confirmed last pick up was end of May ,2021 # 90 tablets.   - Pt is requesting "band name"    Medications :  STOP  Levothyroxine 175 mcg  Start Synthroid 150 mcg daily     2. Grave's Disease:  No extra thyroidal manifestations of graves' disease    3. Idiopathic Hirsutism :   - Pt states this has been noted a month ago. Seems to be limited to the beard/chin area  -Testosterone is normal    F/U in 4 months    Signed electronically by: 01-04-1998, MD  [facility] Endocrinology  [facility] [address] [place] [place], [place] Phone: [phone] FAX: [phone]      CC: [phone], DO [address] [place] Phone: [phone]  Fax: [phone]   Return to Endocrinology clinic as below: Future Appointments  Date Time Provider Department Center  10/19/2019 10:30 AM Dany Harten, 10/21/2019, MD [facility] None  10/30/2019  8:15 AM 11/01/2019, PA-C [facility]

## 2019-10-19 NOTE — Patient Instructions (Signed)

## 2019-10-20 ENCOUNTER — Encounter: Payer: Self-pay | Admitting: Internal Medicine

## 2019-10-20 DIAGNOSIS — L680 Hirsutism: Secondary | ICD-10-CM | POA: Insufficient documentation

## 2019-10-20 MED ORDER — SYNTHROID 150 MCG PO TABS: 150.0000 ug | ORAL_TABLET | Freq: Every day | ORAL | 3 refills | Status: DC

## 2019-10-21 ENCOUNTER — Encounter: Payer: Self-pay | Admitting: Family Medicine

## 2019-10-21 ENCOUNTER — Ambulatory Visit: Payer: Medicaid Other | Admitting: Family Medicine

## 2019-10-21 ENCOUNTER — Other Ambulatory Visit: Payer: Self-pay

## 2019-10-21 VITALS — BP 114/81 | HR 79 | Temp 97.8°F | Resp 16 | Ht 65.0 in | Wt 194.0 lb

## 2019-10-21 DIAGNOSIS — G4719 Other hypersomnia: Secondary | ICD-10-CM | POA: Diagnosis not present

## 2019-10-21 DIAGNOSIS — R55 Syncope and collapse: Secondary | ICD-10-CM | POA: Diagnosis not present

## 2019-10-21 DIAGNOSIS — G4701 Insomnia due to medical condition: Secondary | ICD-10-CM | POA: Diagnosis not present

## 2019-10-21 DIAGNOSIS — F3342 Major depressive disorder, recurrent, in full remission: Secondary | ICD-10-CM

## 2019-10-21 DIAGNOSIS — E034 Atrophy of thyroid (acquired): Secondary | ICD-10-CM

## 2019-10-21 LAB — TESTOSTERONE, TOTAL, LC/MS/MS: Testosterone, Total, LC-MS-MS: 19 ng/dL (ref 2–45)

## 2019-10-21 NOTE — Progress Notes (Signed)
Subjective:    Patient ID: Kari Gallagher, female    DOB: December 25, 1980, 39 y.o.   MRN: [id number]  Kari Gallagher is a 39 y.o. female presenting on 10/21/2019 for Loss of Consciousness (onset week ago--vomitting so far happened X4 in four years as per patient hypothyroidism causes it but had seen endocrinologist --suggested to seen by PCP for hair loss )   HPI   Vasovagal Syncope Recent events. She got up and felt lightheadedness, dizziness, nausea, and she was found down and lost consciousness. She felt weak and required help after it resolved. - She has had this in past >4 years ago - She has family history father has similar episodes  Hypothyroidism / hirsutism Followed by Dr Kari Gallagher - [facility]) Last visit 10/19/19  Had issues with thyroid dosing mix up, now back on right dose. Should be on Synthroid daily Concern with some hair growth hirsutism, awaiting testosterone lab result  INSOMNIA Chronic problem. Previously Shift Work Sleep Disorder, was working variable shifts and 2nd shift, had disrupted sleep schedule. She has been on medications in past with Trazodone and Ambien in past with mixed results. - Now for past 1 year during COVID no longer working, and she has maintained a more normal sleep schedule and has done much better - however now still having episodes very poor sleep insomnia, unable to explain. Her father has a sleep disorder thought to be genetic unsure diagnosis. - She now feels less rested, but not having worse mood symptoms - Takes Trazodone 100mg  nightly PRN - never completed sleep study in past.  Depression screen [facility] 2/9 10/21/2019 06/23/2019 10/15/2018  Decreased Interest 0 0 1  Down, Depressed, Hopeless 0 0 1  PHQ - 2 Score 0 0 2  Altered sleeping 0 0 3  Tired, decreased energy 0 1 3  Change in appetite 0 0 0  Feeling bad or failure about yourself  0 0 0  Trouble concentrating 0 0 1  Moving slowly or fidgety/restless  0 0 1  Suicidal thoughts 0 0 0  PHQ-9 Score 0 1 10  Difficult doing work/chores Not difficult at all Not difficult at all -  Some recent data might be hidden    Social History   Tobacco Use  . Smoking status: Former Smoker    Packs/day: 0.50    Types: Cigarettes    Quit date: 04/02/2018    Years since quitting: 1.5  . Smokeless tobacco: Former 06/02/2018  . Vaping Use: Never used  Substance Use Topics  . Alcohol use: No  . Drug use: No    Review of Systems Per HPI unless specifically indicated above     Objective:    BP 114/81   Pulse 79   Temp 97.8 F (36.6 C) (Temporal)   Resp 16   Ht 5\' 5"  (1.651 m)   Wt 194 lb (88 kg)   LMP 10/14/2019   SpO2 100%   BMI 32.28 kg/m   Wt Readings from Last 3 Encounters:  10/21/19 194 lb (88 kg)  10/19/19 192 lb (87.1 kg)  08/27/19 192 lb 11.2 oz (87.4 kg)    Physical Exam Vitals and nursing note reviewed.  Constitutional:      General: She is not in acute distress.    Appearance: She is well-developed. She is not diaphoretic.     Comments: Well-appearing, comfortable, cooperative  HENT:     Head: Normocephalic and atraumatic.  Eyes:  General:        Right eye: No discharge.        Left eye: No discharge.     Conjunctiva/sclera: Conjunctivae normal.  Neck:     Thyroid: No thyromegaly.  Cardiovascular:     Rate and Rhythm: Normal rate and regular rhythm.     Heart sounds: Normal heart sounds. No murmur heard.   Pulmonary:     Effort: Pulmonary effort is normal. No respiratory distress.     Breath sounds: Normal breath sounds. No wheezing or rales.  Musculoskeletal:        General: Normal range of motion.     Cervical back: Normal range of motion and neck supple.  Lymphadenopathy:     Cervical: No cervical adenopathy.  Skin:    General: Skin is warm and dry.     Findings: No erythema or rash.  Neurological:     Mental Status: She is alert and oriented to person, place, and time.     Comments: , tired  appearing  Psychiatric:        Behavior: Behavior normal.     Comments: Well groomed, good eye contact, normal speech and thoughts    Results for orders placed or performed in visit on 10/19/19  TSH  Result Value Ref Range   TSH 37.06 (H) 0.35 - 4.50 uIU/mL      Assessment & Plan:   Problem List Items Addressed This Visit    Major depression in full remission (HCC)   Insomnia   Relevant Orders   Ambulatory referral to Neurology   Hypothyroidism    Other Visit Diagnoses    Vasovagal syncope    -  Primary   Relevant Orders   Ambulatory referral to Neurology   Excessive daytime sleepiness       Relevant Orders   Ambulatory referral to Neurology      Hirsutism Per endocrine has testosterone lab running, pending She has scheduled w/ GYN awaiting apt Can follow up in future if indicated. Do not see meds that would cause.  #Vasovagal syncope Suspected most likely vasovagal syncopal episode given history and reassuring clinical exam History of similar episodes Father has same history No other complications, has resolved, no recurrence now  Plan: 1. Reassurance, counseled on vasovagal syncope 2. Improve hydration, keep track of water consumption, less tea.  3. Will refer to [facility] Neuro for additional eval, if severe recurrence seek care immediately may warrant future cardiology if unable to identify neurological cause related to her sleep and episodes.  #Insomnia, excessive sleepiness / sleep disorder referral to [facility] Neurology for evaluation of chronic sleep disorder and syncopal episodes, has family history of similar problem with father, she had severe difficulty with shift work sleep disorder in past now still has persistent sleep problem insomnia and question sleep disorder with awakenings and possible RLS overnight on regular daytime work schedule, she would benefit from Neuro eval and sleep study    Orders Placed This Encounter  Procedures  . Ambulatory referral to  Neurology    Referral Priority:   Routine    Referral Type:   Consultation    Referral Reason:   Specialty Services Required    Requested Specialty:   Neurology    Number of Visits Requested:   1     No orders of the defined types were placed in this encounter.     Follow up plan: Return if symptoms worsen or fail to improve, for insomnia /sleep .  [name], DO [facility] 10/21/2019, 11:04 AM

## 2019-10-21 NOTE — Patient Instructions (Addendum)
Thank you for coming to the office today.  [facility] Neurology referral due to both sleep issue and the syncope passing out episodes.  1. As discussed, I do not know the exact cause of your syncopal episodes (passing out), usually we divide this into either concerning or less concerning syncopal episodes. The most common type is Vasovagal Syncope, often described as you did with feeling flushed or sweating, lightheaded or dizzy, usually these are random episodes, triggered by a variety of causes (can be stress, emotional, physical, straining with exercise or bowel movement even, dehydration poor intake). Still a medical concern, but usually more of a benign problem, there is limited treatment or testing to be done for this type of syncope.  The concerning syncope is either caused by Cardiac (Heart) or Neurogenic (Brain), usually provoked by exertional activity, associated with high risk symptoms chest pain, tightness or pressure, shortness of breath, headache, or stroke like symptoms significant facial or arm/leg weakness, numbness, or related to seizure activity - if you develop any of these symptoms seek help immediately at hospital ED.  From now on, be mindful of possible syncopal episodes, I would encourage increase water hydration, try using a bottle or way to measure water intake, goal for at least 12-16 oz container about 2-3 times a day, can reduce tea intake, as this tends to cause you to be more dehydrated.   Also try to limit alcohol intake  If syncopal episode occurs again without any of the above significant red flag symptoms, and it resolves on it's own and you don't feel persistently sick, then you may notify our office, follow-up in our office, or seek treatment at Urgent Care, we can discuss future referrals such as Cardiology and other testing.    Please schedule a Follow-up Appointment to: Return if symptoms worsen or fail to improve, for insomnia /sleep .  If you have any other  questions or concerns, please feel free to call the office or send a message through MyChart. You may also schedule an earlier appointment if necessary.  Additionally, you may be receiving a survey about your experience at our office within a few days to 1 week by e-mail or mail. We value your feedback.  [name], DO [facility], [place]

## 2019-10-30 ENCOUNTER — Ambulatory Visit: Payer: BC Managed Care – PPO | Admitting: Medical

## 2020-01-28 DIAGNOSIS — R55 Syncope and collapse: Secondary | ICD-10-CM | POA: Diagnosis not present

## 2020-02-22 ENCOUNTER — Other Ambulatory Visit: Payer: Self-pay

## 2020-02-22 DIAGNOSIS — R55 Syncope and collapse: Secondary | ICD-10-CM | POA: Diagnosis not present

## 2020-02-22 DIAGNOSIS — E039 Hypothyroidism, unspecified: Secondary | ICD-10-CM | POA: Diagnosis not present

## 2020-02-24 ENCOUNTER — Encounter: Payer: Self-pay | Admitting: Internal Medicine

## 2020-02-24 ENCOUNTER — Other Ambulatory Visit: Payer: Self-pay

## 2020-02-24 ENCOUNTER — Ambulatory Visit (INDEPENDENT_AMBULATORY_CARE_PROVIDER_SITE_OTHER): Payer: BC Managed Care – PPO | Admitting: Internal Medicine

## 2020-02-24 VITALS — BP 116/82 | HR 76 | Ht 65.0 in | Wt 182.4 lb

## 2020-02-24 DIAGNOSIS — E559 Vitamin D deficiency, unspecified: Secondary | ICD-10-CM

## 2020-02-24 DIAGNOSIS — E890 Postprocedural hypothyroidism: Secondary | ICD-10-CM | POA: Diagnosis not present

## 2020-02-24 LAB — VITAMIN D 25 HYDROXY (VIT D DEFICIENCY, FRACTURES): VITD: 21.62 ng/mL — ABNORMAL LOW (ref 30.00–100.00)

## 2020-02-24 LAB — TSH: TSH: 0.27 u[IU]/mL — ABNORMAL LOW (ref 0.35–4.50)

## 2020-02-24 MED ORDER — SYNTHROID 137 MCG PO TABS: 137.0000 ug | ORAL_TABLET | Freq: Every day | ORAL | 1 refills | Status: DC

## 2020-02-24 MED ORDER — LEVOTHYROXINE SODIUM 137 MCG PO TABS: 137.0000 ug | ORAL_TABLET | Freq: Every day | ORAL | 1 refills | Status: DC

## 2020-02-24 NOTE — Patient Instructions (Signed)
Please stop by the lab

## 2020-02-24 NOTE — Progress Notes (Signed)
Name: Kari Gallagher  MRN/ DOB: [id number], 1980/07/06    Age/ Sex: 40 y.o., female     PCP: Smitty Cords, DO   Reason for Endocrinology Evaluation: Hypothyroidism     Initial Endocrinology Clinic Visit: 02/02/2019    PATIENT IDENTIFIER: Kari Gallagher is a 40 y.o., female with a past medical history of migraine headaches, hypothyroidism and prediabetes.  She has followed with [facility] since 02/02/2019 for consultative assistance with management of her Hypothyroidism.   HISTORICAL SUMMARY: The patient was first diagnosed with  hyperthyroidism in 2014 . She is S/P thyroid uptake and scan in 2014 and in 2016 with 24- hr increase uptake consistent with Graves' Disease.  She is S/P RAI ablation in 2016 followed by hypothyroidism (records not available)     SUBJECTIVE:   Today (02/24/2020):  Kari Gallagher is here for a follow up on hypothyroidism.   She has been compliant with levothyroxine intake since using the "pill box" she takes half a tablet on Sundays and 1 tablet the rest of the week.   She has lost weight  She has been episodes of lightheaded and weakness associated with sweats associated with nausea - has a referral to cardio and endo    She denies any constipation No local neck symptoms  Menstruations more regular     HISTORY:  Past Medical History:  Past Medical History:  Diagnosis Date  . Frequent headaches   . Hypothyroidism   . Leaky heart valve    Past Surgical History:  Past Surgical History:  Procedure Laterality Date  . CHOLECYSTECTOMY    . TUBAL LIGATION  2006    Social History:  reports that she quit smoking about 22 months ago. Her smoking use included cigarettes. She smoked 0.50 packs per day. She has quit using smokeless tobacco. She reports that she does not drink alcohol and does not use drugs. Family History:  Family History  Problem Relation Age of Onset  . Stroke Mother   . Depression Mother   . Mental  illness Mother   . Heart disease Father   . Thyroid disease Father   . Lung cancer Father   . Thyroid disease Brother      HOME MEDICATIONS: Allergies as of 02/24/2020   No Known Allergies     Medication List       Accurate as of February 24, 2020  9:42 AM. If you have any questions, ask your nurse or doctor.        furosemide 20 MG tablet Commonly known as: LASIX Take 1 tablet (20 mg total) by mouth daily as needed for fluid or edema.   multivitamin with minerals Tabs tablet Take 1 tablet by mouth daily.   potassium chloride 10 MEQ tablet Commonly known as: KLOR-CON Take 10 mEq by mouth daily as needed.   Synthroid 150 MCG tablet Generic drug: levothyroxine Take 1 tablet (150 mcg total) by mouth daily before breakfast.   traZODone 100 MG tablet Commonly known as: DESYREL Take 1 tablet (100 mg total) by mouth at bedtime as needed for sleep. Start with half tablet (50mg ) for up to 1 week, then increase to 1 whole tablet         OBJECTIVE:   PHYSICAL EXAM: VS:BP 116/82   Pulse 76   Ht 5\' 5"  (1.651 m)   Wt 182 lb 6 oz (82.7 kg)   LMP 02/23/2020   SpO2 99%   BMI 30.35 kg/m  EXAM: General: Pt appears well and is in NAD Thick dark hairs noted aver the beard/chin area   Neck: General: Supple without adenopathy. Thyroid: No goiter or nodules appreciated  Lungs: Clear with good BS bilat with no rales, rhonchi, or wheezes  Heart: Auscultation: RRR.  Abdomen: Normoactive bowel sounds, soft, nontender, without masses or organomegaly palpable  Extremities:  BL LE: Trace pretibial edema  Mental Status: Judgment, insight: Intact Orientation: Oriented to time, place, and person Mood and affect: No depression, anxiety, or agitation     DATA REVIEWED: Results for Kari, Gallagher (MRN [id number]) as of 02/24/2020 17:01  Ref. Range 03/30/2019 08:31 06/12/2019 09:50 10/19/2019 11:21 02/24/2020 10:05  TSH Latest Ref Range: 0.35 - 4.50 uIU/mL 0.04 (L) 3.12 37.06  (H) 0.27 (L)   ASSESSMENT / PLAN / RECOMMENDATIONS:   1. Postablative hypothyroidism   - She is clinically euthyroid - No local neck symptoms  - Historically with compliance issues, hence the fluctuating TSH . Recently more compliant and current dose of synthroid needs to be decreased   Medications :   Stop  Synthroid 150 mcg daily  Start Synthroid 137 mcg daily    2. Grave's Disease:  No extra thyroidal manifestations of graves' disease    3. Vitamin D deficiency:  She is on 2000 iu daily , Vitamin D low, will increase to 3000 iu daily    F/U in 6 months  Labs in 8 weeks    Signed electronically by: Lyndle Herrlich, MD  [facility]  [facility] [address] [place] [place], [place] [address] Phone: [phone] FAX: [phone]      CC: Smitty Cords, DO [address] [place] [address] Phone: [phone]  Fax: [phone]   Return to Endocrinology clinic as below: No future appointments.

## 2020-03-14 DIAGNOSIS — R55 Syncope and collapse: Secondary | ICD-10-CM | POA: Diagnosis not present

## 2020-03-18 ENCOUNTER — Other Ambulatory Visit: Payer: Self-pay

## 2020-03-18 ENCOUNTER — Encounter: Payer: Self-pay | Admitting: Family Medicine

## 2020-03-18 ENCOUNTER — Ambulatory Visit (INDEPENDENT_AMBULATORY_CARE_PROVIDER_SITE_OTHER): Payer: BC Managed Care – PPO | Admitting: Family Medicine

## 2020-03-18 ENCOUNTER — Telehealth: Payer: Self-pay

## 2020-03-18 VITALS — BP 115/82 | HR 97 | Ht 65.0 in | Wt 179.4 lb

## 2020-03-18 DIAGNOSIS — R6881 Early satiety: Secondary | ICD-10-CM | POA: Diagnosis not present

## 2020-03-18 DIAGNOSIS — R55 Syncope and collapse: Secondary | ICD-10-CM | POA: Diagnosis not present

## 2020-03-18 DIAGNOSIS — R1084 Generalized abdominal pain: Secondary | ICD-10-CM

## 2020-03-18 DIAGNOSIS — R630 Anorexia: Secondary | ICD-10-CM

## 2020-03-18 NOTE — Patient Instructions (Addendum)
Thank you for coming to the office today.  Work note with restrictions  We will do the short term disability for 3 months. May need to extend it if need  -------------  Referral to GI specialist, stay tuned for apt  [facility]) [address] - [address] [place], [place] [address] Phone: [phone]  As discussed, I do not know the exact cause of your syncopal episodes (passing out), usually we divide this into either concerning or less concerning syncopal episodes. The most common type is Vasovagal Syncope, often described as you did with feeling flushed or sweating, lightheaded or dizzy, usually these are random episodes, triggered by a variety of causes (can be stress, emotional, physical, straining with exercise or bowel movement even, dehydration poor intake). Still a medical concern, but usually more of a benign problem, there is limited treatment or testing to be done for this type of syncope.  The concerning syncope is either caused by Cardiac (Heart) or Neurogenic (Brain), usually provoked by exertional activity, associated with high risk symptoms chest pain, tightness or pressure, shortness of breath, headache, or stroke like symptoms significant facial or arm/leg weakness, numbness, or related to seizure activity - if you develop any of these symptoms seek help immediately at hospital ED.  From now on, be mindful of possible syncopal episodes, I would encourage increase water hydration, try using a bottle or way to measure water intake, goal for at least 12-16 oz container about 2-3 times a day, can reduce tea intake, as this tends to cause you to be more dehydrated.    Please schedule a Follow-up Appointment to: Return in about 6 weeks (around 04/29/2020) for 4 week follow-up syncopal episodes / nutrition.  If you have any other questions or concerns, please feel free to call the office or send a message through MyChart. You may also schedule an  earlier appointment if necessary.  Additionally, you may be receiving a survey about your experience at our office within a few days to 1 week by e-mail or mail. We value your feedback.  [name], DO [facility], [facility]   Syncope Syncope is when you pass out (faint) for a short time. It is caused by a sudden decrease in blood flow to the brain. Signs that you may be about to pass out include:  Feeling dizzy or light-headed.  Feeling sick to your stomach (nauseous).  Seeing all white or all black.  Having cold, clammy skin. If you pass out, get help right away. Call your local emergency services (911 in the [place] Do not drive yourself to the hospital. Follow these instructions at home: Watch for any changes in your symptoms. Take these actions to stay safe and help with your symptoms: Lifestyle  Do not drive, use machinery, or play sports until your doctor says it is okay.  Do not drink alcohol.  Do not use any products that contain nicotine or tobacco, such as cigarettes and e-cigarettes. If you need help quitting, ask your doctor.  Drink enough fluid to keep your pee (urine) pale yellow. General instructions  Take over-the-counter and prescription medicines only as told by your doctor.  If you are taking blood pressure or heart medicine, sit up and stand up slowly. Spend a few minutes getting ready to sit and then stand. This can help you feel less dizzy.  Have someone stay with you until you feel stable.  If you start to feel like you might pass out, lie  down right away and raise (elevate) your feet above the level of your heart. Breathe deeply and steadily. Wait until all of the symptoms are gone.  Keep all follow-up visits as told by your doctor. This is important. Get help right away if:  You have a very bad headache.  You pass out once or more than once.  You have pain in your chest, belly, or back.  You have a very fast or uneven  heartbeat (palpitations).  It hurts to breathe.  You are bleeding from your mouth or your bottom (rectum).  You have black or tarry poop (stool).  You have jerky movements that you cannot control (seizure).  You are confused.  You have trouble walking.  You are very weak.  You have vision problems. These symptoms may be an emergency. Do not wait to see if the symptoms will go away. Get medical help right away. Call your local emergency services (911 in the [place] Do not drive yourself to the hospital. Summary  Syncope is when you pass out (faint) for a short time. It is caused by a sudden decrease in blood flow to the brain.  Signs that you may be about to faint include feeling dizzy, light-headed, or sick to your stomach, seeing all white or all black, or having cold, clammy skin.  If you start to feel like you might pass out, lie down right away and raise (elevate) your feet above the level of your heart. Breathe deeply and steadily. Wait until all of the symptoms are gone. This information is not intended to replace advice given to you by your health care provider. Make sure you discuss any questions you have with your health care provider. Document Revised: 01/28/2019 Document Reviewed: 01/30/2017 Elsevier Patient Education  2021 [employer].

## 2020-03-18 NOTE — Telephone Encounter (Signed)
Copied from CRM [id number]. Topic: Appointment Scheduling - Scheduling Inquiry for Clinic >> Mar 18, 2020  9:18 AM Elliot Gault wrote: Patient was advised to call PCP office and request an appt as soon as possible if she had a vasovagal episode. Patient exp. dizziness at work on Tuesday 03/15/2020. Scheduled patient for Monday 03/21/2020. Please call patient regarding work in

## 2020-03-18 NOTE — Telephone Encounter (Signed)
Appt scheduled for this afternoon at 4pm.

## 2020-03-18 NOTE — Progress Notes (Signed)
Subjective:    Patient ID: Kari Gallagher, female    DOB: Jul 26, 1980, 40 y.o.   MRN: [id number]  Kari Gallagher is a 40 y.o. female presenting on 03/18/2020 for Loss of Consciousness   HPI   Syncopal episodes, suspected vasovagal Syncope  Early Satiety / Poor PO intake / Chronic postprandial abdominal pain  Previous job she was given letter for restrictions, can not accommodate her. She has now relocated jobs. Now new job - 8 hours a day, Mon-Fri, however they advised her she may need to work up to 7 days.  On Tuesday 3/15, Sudden episode felt overheated and dizzy, she was going to clock out from work, she went to bathroom and sat on bench, felt very lightheaded. She felt better and then sudden "rush of heat"  Family history of syncope, her father had similar problem  Cardiology and a Echocardiogram done on 03/14/2020. She has a Neurology appt scheduled in 05/25/2020  Neurology is ordering labs dysautonomia panel, and EEG for ruling out seizures. Awaiting EEG.  She has issues with managing her thyroid still. Followed by Endocrinology  Poor PO intake due to meals hurt her stomach, causes cramps. Taking Ensure. She feels overworked and stressed and overall not rested. She admits can go days without eating.  Working up to 12 hour shifts Emotional lability at times as well   Past Surgical History:  Procedure Laterality Date  . CHOLECYSTECTOMY    . TUBAL LIGATION  2006    Depression screen [facility] 2/9 10/21/2019 06/23/2019 10/15/2018  Decreased Interest 0 0 1  Down, Depressed, Hopeless 0 0 1  PHQ - 2 Score 0 0 2  Altered sleeping 0 0 3  Tired, decreased energy 0 1 3  Change in appetite 0 0 0  Feeling bad or failure about yourself  0 0 0  Trouble concentrating 0 0 1  Moving slowly or fidgety/restless 0 0 1  Suicidal thoughts 0 0 0  PHQ-9 Score 0 1 10  Difficult doing work/chores Not difficult at all Not difficult at all -  Some recent data might be hidden    Social  History   Tobacco Use  . Smoking status: Former Smoker    Packs/day: 0.50    Types: Cigarettes    Quit date: 04/02/2018    Years since quitting: 1.9  . Smokeless tobacco: Former Clinical biochemist  . Vaping Use: Never used  Substance Use Topics  . Alcohol use: No  . Drug use: No    Review of Systems Per HPI unless specifically indicated above     Objective:    BP 115/82   Pulse 97   Ht 5\' 5"  (1.651 m)   Wt 179 lb 6.4 oz (81.4 kg)   LMP 02/23/2020   SpO2 100%   BMI 29.85 kg/m   Wt Readings from Last 3 Encounters:  03/18/20 179 lb 6.4 oz (81.4 kg)  02/24/20 182 lb 6 oz (82.7 kg)  10/21/19 194 lb (88 kg)    Physical Exam Vitals and nursing note reviewed.  Constitutional:      General: She is not in acute distress.    Appearance: She is well-developed. She is not diaphoretic.     Comments: Well-appearing, comfortable, cooperative  HENT:     Head: Normocephalic and atraumatic.  Eyes:     General:        Right eye: No discharge.        Left eye: No discharge.  Conjunctiva/sclera: Conjunctivae normal.  Cardiovascular:     Rate and Rhythm: Normal rate.  Pulmonary:     Effort: Pulmonary effort is normal.  Skin:    General: Skin is warm and dry.     Findings: No erythema or rash.  Neurological:     Mental Status: She is alert and oriented to person, place, and time.  Psychiatric:        Behavior: Behavior normal.     Comments: Well groomed, good eye contact, normal speech and thoughts, anxious, crying spell    Results for orders placed or performed in visit on 02/24/20  VITAMIN D 25 Hydroxy (Vit-D Deficiency, Fractures)  Result Value Ref Range   VITD 21.62 (L) 30.00 - 100.00 ng/mL  TSH  Result Value Ref Range   TSH 0.27 (L) 0.35 - 4.50 uIU/mL      Assessment & Plan:   Problem List Items Addressed This Visit   None   Visit Diagnoses    Vasovagal syncope    -  Primary   Early satiety       Relevant Orders   Ambulatory referral to Gastroenterology    Appetite loss       Relevant Orders   Ambulatory referral to Gastroenterology   Generalized postprandial abdominal pain       Relevant Orders   Ambulatory referral to Gastroenterology      #Vasovagal Syncope Currently being worked up by [facility] Recent ECHO Upcoming EEG,still needs to be done it seems Currently determined by specialists still likely vasovagal etiology Clinically consistent with occurring with episodes of emotional and physical stress and exhaustion Also has family history similar with father having episodes  Today discussed that she will need to continue with work restrictions as she had in place from prior job, will write new letter today, given to patient with her work restrictions, max 5 days a week, 8 hours a day 40 hours a week, avoid heavy lifting >15 lbs. - however, if she cannot work current job with any of these restrictions they will have her go on Short Term Disability, will need to complete paperwork, she will send it to [place] via fax or email, can complete it next week   Early Satiety Poor PO Chronic postprandial abdominal pain She is s/p cholecystectomy, history of ruptured appendix non surgical management 2015 or earlier Prior reported history of EGD dx with GERD but she does not recall when or where, somewhere in [place] problems today discussed, may be major contributing factor to her recurrent vasovagal syncope episodes and her constellation of problem that are impacting her now.  Very poor nutrition by her report, suspect some early satiety is impacting her daily nutrition and impacting her function. Discussed Ensure, supplement, vitamins, hydration, gatorade G2  Referral to [facility] for evaluation, may warrant other work up  Orders Placed This Encounter  Procedures  . Ambulatory referral to Gastroenterology    Referral Priority:   Routine    Referral Type:   Consultation    Referral Reason:   Specialty Services Required     Number of Visits Requested:   1    No orders of the defined types were placed in this encounter.     Follow up plan: Return in about 6 weeks (around 04/29/2020) for 4 week follow-up syncopal episodes / nutrition.   [name], DO [facility] 03/18/2020, 4:23 PM

## 2020-03-18 NOTE — Telephone Encounter (Signed)
The pt is requesting to be worked in today for a vasovagal episode that happen on Tuesday, March 15 th. An appt was scheduled for this coming up Monday, March 21 st. The patient was seen by Cardiology and a Echocardiogram done on 03/14/2020. She has a Neurology appt scheduled in 05/25/2020.  Please advise

## 2020-03-21 ENCOUNTER — Encounter: Payer: Self-pay | Admitting: *Deleted

## 2020-03-21 ENCOUNTER — Ambulatory Visit: Payer: BC Managed Care – PPO | Admitting: Family Medicine

## 2020-04-11 DIAGNOSIS — M2240 Chondromalacia patellae, unspecified knee: Secondary | ICD-10-CM | POA: Insufficient documentation

## 2020-04-12 ENCOUNTER — Ambulatory Visit (INDEPENDENT_AMBULATORY_CARE_PROVIDER_SITE_OTHER): Payer: BC Managed Care – PPO | Admitting: Gastroenterology

## 2020-04-12 ENCOUNTER — Encounter: Payer: Self-pay | Admitting: Gastroenterology

## 2020-04-12 VITALS — BP 125/81 | HR 96 | Temp 97.9°F | Ht 65.0 in | Wt 179.6 lb

## 2020-04-12 DIAGNOSIS — R109 Unspecified abdominal pain: Secondary | ICD-10-CM

## 2020-04-12 DIAGNOSIS — R1013 Epigastric pain: Secondary | ICD-10-CM

## 2020-04-12 MED ORDER — PANTOPRAZOLE SODIUM 40 MG PO TBEC: 40.0000 mg | DELAYED_RELEASE_TABLET | Freq: Every day | ORAL | 0 refills | Status: DC

## 2020-04-12 MED ORDER — METAMUCIL 0.36 G PO CAPS: 1.0000 | ORAL_CAPSULE | Freq: Every day | ORAL | 0 refills | Status: AC

## 2020-04-12 NOTE — Progress Notes (Signed)
Melodie Bouillon [address]  [address]  [place], [place] [address]  Main: [phone]  Fax: [phone]   Gastroenterology Consultation  Referring Provider:     [name] * Primary Care Physician:  Smitty Cords, DO Reason for Consultation:    Abdominal pain        HPI:    Chief Complaint  Patient presents with  . Abdominal Pain    GIANA CASTNER is a 40 y.o. y/o female referred for consultation & management  by Dr. Althea Charon, Netta Neat, DO.  Patient reports diffuse abdominal pain for 2 to 3 months.  Sharp, nonradiating, 5 or 10, no aggravating or relieving factors.  Reports early satiety.  Associated nausea and vomiting.  Denies specific dysphagia, but states it sometimes feels like food is sitting in her chest. Had an Upper endoscopy 2015 with Dr. Servando Snare for dysphagia. See provation.  They showed a normal esophagus.  Gastric erythema.  Biopsies were obtained in the esophagus and stomach.  Biopsy reports not available.  Clinic notes associated with this visit not available.  Patient reports depending on what she eats she will have 1 loose bowel movement a day or none at all.  No blood in stool.  No family history of colon cancer.  No prior colonoscopy.  Most recently, her TSH was very elevated at 37 in October, possibly due to medical medication noncompliance as noted by provider comments under that result,  and her medications were adjusted, and now it is low in February 2022 at 0.27.  Past Medical History:  Diagnosis Date  . Frequent headaches   . Hypothyroidism   . Leaky heart valve     Past Surgical History:  Procedure Laterality Date  . CHOLECYSTECTOMY    . TUBAL LIGATION  2006    Prior to Admission medications   Medication Sig Start Date End Date Taking? Authorizing Provider  pantoprazole (PROTONIX) 40 MG tablet Take 1 tablet (40 mg total) by mouth daily. 04/12/20 05/12/20 Yes Arron Tetrault, Michel Bickers B, MD  Psyllium (METAMUCIL) 0.36  g CAPS Take 1 Dose by mouth daily. 04/12/20 05/12/20 Yes Pasty Spillers, MD  SYNTHROID 137 MCG tablet Take 1 tablet (137 mcg total) by mouth daily before breakfast. 02/24/20  Yes Shamleffer, Konrad Dolores, MD  traZODone (DESYREL) 100 MG tablet Take 1 tablet (100 mg total) by mouth at bedtime as needed for sleep. Start with half tablet (50mg ) for up to 1 week, then increase to 1 whole tablet 06/23/19  Yes Karamalegos, 06/25/19, DO  furosemide (LASIX) 20 MG tablet Take 1 tablet (20 mg total) by mouth daily as needed for fluid or edema. Patient not taking: Reported on 04/12/2020 06/23/19   06/25/19, DO  Multiple Vitamin (MULTIVITAMIN WITH MINERALS) TABS tablet Take 1 tablet by mouth daily. Patient not taking: Reported on 04/12/2020    [provider]  potassium chloride (KLOR-CON) 10 MEQ tablet Take 10 mEq by mouth daily as needed. Patient not taking: Reported on 04/12/2020 06/23/19   [provider]    Family History  Problem Relation Age of Onset  . Stroke Mother   . Depression Mother   . Mental illness Mother   . Heart disease Father   . Thyroid disease Father   . Lung cancer Father   . Thyroid disease Brother      Social History   Tobacco Use  . Smoking status: Former Smoker    Packs/day: 0.50    Types: Cigarettes  Quit date: 04/02/2018    Years since quitting: 2.0  . Smokeless tobacco: Former Clinical biochemist  . Vaping Use: Never used  Substance Use Topics  . Alcohol use: No  . Drug use: No    Allergies as of 04/12/2020  . (No Known Allergies)    Review of Systems:    All systems reviewed and negative except where noted in HPI.   Physical Exam:  BP 125/81   Pulse 96   Temp 97.9 F (36.6 C) (Oral)   Ht 5\' 5"  (1.651 m)   Wt 179 lb 9.6 oz (81.5 kg)   BMI 29.89 kg/m  No LMP recorded. Psych:  Alert and cooperative. Normal mood and affect. General:   Alert,  Well-developed, well-nourished, pleasant and cooperative in NAD Head:   Normocephalic and atraumatic. Eyes:  Sclera clear, no icterus.   Conjunctiva pink. Ears:  Normal auditory acuity. Nose:  No deformity, discharge, or lesions. Mouth:  No deformity or lesions,oropharynx pink & moist. Neck:  Supple; no masses or thyromegaly. Abdomen:  Normal bowel sounds.  No bruits.  Soft, non-tender and non-distended without masses, hepatosplenomegaly or hernias noted.  No guarding or rebound tenderness.    Msk:  Symmetrical without gross deformities. Good, equal movement & strength bilaterally. Pulses:  Normal pulses noted. Extremities:  No clubbing or edema.  No cyanosis. Neurologic:  Alert and oriented x3;  grossly normal neurologically. Skin:  Intact without significant lesions or rashes. No jaundice. Lymph Nodes:  No significant cervical adenopathy. Psych:  Alert and cooperative. Normal mood and affect.   Labs: CBC    Component Value Date/Time   WBC 7.0 02/14/2019 1132   RBC 4.56 02/14/2019 1132   HGB 13.7 02/14/2019 1132   HGB 10.9 (L) 10/15/2018 1115   HCT 42.2 02/14/2019 1132   HCT 35.3 10/15/2018 1115   PLT 214 02/14/2019 1132   PLT 282 10/15/2018 1115   MCV 92.5 02/14/2019 1132   MCV 80 10/15/2018 1115   MCV 86 01/20/2014 1649   MCH 30.0 02/14/2019 1132   MCHC 32.5 02/14/2019 1132   RDW 14.5 02/14/2019 1132   RDW 14.5 10/15/2018 1115   RDW 13.1 01/20/2014 1649   LYMPHSABS 2.2 02/14/2019 1132   LYMPHSABS 2.3 01/20/2014 1649   MONOABS 0.5 02/14/2019 1132   MONOABS 0.5 01/20/2014 1649   EOSABS 0.1 02/14/2019 1132   EOSABS 0.0 01/20/2014 1649   BASOSABS 0.0 02/14/2019 1132   BASOSABS 0.0 01/20/2014 1649   CMP     Component Value Date/Time   NA 137 02/14/2019 1132   NA 138 02/03/2018 1043   NA 137 01/20/2014 1649   K 4.0 02/14/2019 1132   K 4.1 01/20/2014 1649   CL 105 02/14/2019 1132   CL 104 01/20/2014 1649   CO2 24 02/14/2019 1132   CO2 26 01/20/2014 1649   GLUCOSE 98 02/14/2019 1132   GLUCOSE 141 (H) 01/20/2014 1649   BUN 12  02/14/2019 1132   BUN 9 02/03/2018 1043   BUN 11 01/20/2014 1649   CREATININE 0.98 02/14/2019 1132   CREATININE 0.90 04/10/2016 0001   CALCIUM 9.2 02/14/2019 1132   CALCIUM 9.4 01/20/2014 1649   PROT 8.4 (H) 02/14/2019 1132   PROT 7.6 02/03/2018 1043   PROT 8.1 01/20/2014 1649   ALBUMIN 4.2 02/14/2019 1132   ALBUMIN 4.4 02/03/2018 1043   ALBUMIN 3.7 01/20/2014 1649   AST 19 02/14/2019 1132   AST 48 (H) 01/20/2014 1649   ALT 16 02/14/2019 1132  ALT 40 01/20/2014 1649   ALKPHOS 95 02/14/2019 1132   ALKPHOS 180 (H) 01/20/2014 1649   BILITOT 0.5 02/14/2019 1132   BILITOT 0.3 02/03/2018 1043   BILITOT 0.3 01/20/2014 1649   GFRNONAA >60 02/14/2019 1132   GFRNONAA 83 04/10/2016 0001   GFRAA >60 02/14/2019 1132   GFRAA >89 04/10/2016 0001    Imaging Studies: No results found.  Assessment and Plan:   SHAQUILA SIGMAN is a 40 y.o. y/o female has been referred for abdominal pain  Obtain H. pylori breath test Start PPI for 30 days for dyspepsia after obtaining H. pylori breath test  Start Metamucil daily to help regulate bowel movements  Patient states she may have had similar symptoms at the time of her previous upper endoscopy  No indication for urgent endoscopy at this time  If symptoms do not improve, consider endoscopy  (Risks of PPI use were discussed with patient including bone loss, C. Diff diarrhea, pneumonia, infections, CKD, electrolyte abnormalities.  Pt. Verbalizes understanding and chooses to continue the medication.)   Dr Melodie Bouillon  Speech recognition software was used to dictate the above note.

## 2020-04-14 LAB — H. PYLORI BREATH TEST: H pylori Breath Test: NEGATIVE

## 2020-04-21 ENCOUNTER — Other Ambulatory Visit (INDEPENDENT_AMBULATORY_CARE_PROVIDER_SITE_OTHER): Payer: BC Managed Care – PPO

## 2020-04-21 ENCOUNTER — Other Ambulatory Visit: Payer: Self-pay

## 2020-04-21 DIAGNOSIS — E890 Postprocedural hypothyroidism: Secondary | ICD-10-CM | POA: Diagnosis not present

## 2020-04-21 LAB — TSH: TSH: 0.23 u[IU]/mL — ABNORMAL LOW (ref 0.35–4.50)

## 2020-04-22 ENCOUNTER — Encounter: Payer: Self-pay | Admitting: Internal Medicine

## 2020-04-22 MED ORDER — SYNTHROID 125 MCG PO TABS: 125.0000 ug | ORAL_TABLET | Freq: Every day | ORAL | 6 refills | Status: DC

## 2020-04-26 ENCOUNTER — Encounter: Payer: Self-pay | Admitting: Internal Medicine

## 2020-04-27 ENCOUNTER — Other Ambulatory Visit: Payer: Self-pay | Admitting: Internal Medicine

## 2020-04-27 DIAGNOSIS — E890 Postprocedural hypothyroidism: Secondary | ICD-10-CM

## 2020-04-27 NOTE — Telephone Encounter (Signed)
PLease call the pt and schedule her for a " LAB appointment only "   Thanks

## 2020-05-01 DIAGNOSIS — R55 Syncope and collapse: Secondary | ICD-10-CM | POA: Diagnosis not present

## 2020-05-04 ENCOUNTER — Other Ambulatory Visit (INDEPENDENT_AMBULATORY_CARE_PROVIDER_SITE_OTHER): Payer: BC Managed Care – PPO

## 2020-05-04 ENCOUNTER — Other Ambulatory Visit: Payer: Self-pay

## 2020-05-04 DIAGNOSIS — E890 Postprocedural hypothyroidism: Secondary | ICD-10-CM | POA: Diagnosis not present

## 2020-05-04 LAB — TSH: TSH: 0.21 u[IU]/mL — ABNORMAL LOW (ref 0.35–4.50)

## 2020-05-23 ENCOUNTER — Other Ambulatory Visit: Payer: Self-pay

## 2020-05-23 ENCOUNTER — Encounter: Payer: Self-pay | Admitting: Family Medicine

## 2020-05-23 ENCOUNTER — Ambulatory Visit (INDEPENDENT_AMBULATORY_CARE_PROVIDER_SITE_OTHER): Payer: BC Managed Care – PPO | Admitting: Family Medicine

## 2020-05-23 VITALS — BP 100/62 | HR 64 | Resp 15 | Ht 65.0 in | Wt 180.6 lb

## 2020-05-23 DIAGNOSIS — G4701 Insomnia due to medical condition: Secondary | ICD-10-CM | POA: Diagnosis not present

## 2020-05-23 DIAGNOSIS — R630 Anorexia: Secondary | ICD-10-CM | POA: Diagnosis not present

## 2020-05-23 DIAGNOSIS — F3342 Major depressive disorder, recurrent, in full remission: Secondary | ICD-10-CM

## 2020-05-23 DIAGNOSIS — E890 Postprocedural hypothyroidism: Secondary | ICD-10-CM | POA: Diagnosis not present

## 2020-05-23 DIAGNOSIS — R6881 Early satiety: Secondary | ICD-10-CM

## 2020-05-23 DIAGNOSIS — R55 Syncope and collapse: Secondary | ICD-10-CM

## 2020-05-23 NOTE — Patient Instructions (Addendum)
Thank you for coming to the office today.  Paper work completed today.  Proceed with sleep study.  Return to work tomorrow with restrictions so you cannot return to work for 3 months. Eventually 3 months will be returning without restrictions.  Please schedule a Follow-up Appointment to: Return in about 3 months (around 08/23/2020) for 3 month follow-up Return to work - insomnia, near syncopal.  If you have any other questions or concerns, please feel free to call the office or send a message through MyChart. You may also schedule an earlier appointment if necessary.  Additionally, you may be receiving a survey about your experience at our office within a few days to 1 week by e-mail or mail. We value your feedback.  [name], DO [facility], [place]

## 2020-05-23 NOTE — Progress Notes (Signed)
Subjective:    Patient ID: Kari Gallagher, female    DOB: 11-15-1980, 40 y.o.   MRN: [id number]  Kari Gallagher is a 40 y.o. female presenting on 05/23/2020 for FMLA (Paper work) and Syncope   HPI   Syncopal episodes, suspected vasovagal Syncope  Early Satiety / Poor PO intake / Chronic postprandial abdominal pain  Last visit with me for this topic 03/18/20, initial visit for evaluation for syncope here.  Short Term Disability initial start 03/17/20. Recently updated that this was approved through 05/25/20.  On Tuesday 3/15, Sudden episode felt overheated and dizzy, she was going to clock out from work, she went to bathroom and sat on bench, felt very lightheaded. She felt better and then sudden "rush of heat"  Family history of syncope, her father had similar problem  Cardiology and a Echocardiogram done on 03/14/2020. She has a Neurology appt scheduled in 05/25/2020  Neurology is ordering labs dysautonomia panel, and EEG for ruling out seizures. Awaiting EEG.  Here today for extension, since cannot return to work due to work restrictions have not allowed her to perform her job.  Last episode last week Thursday or Friday. Similar near syncopal episode.  She has issues with insomnia and not able to go to sleep at times, up to 38 hours straight awake. Taking Trazodone PRN with some improvement.  Takes Boost PRN. She has upcoming apt with Sleep Study and GI Specialist  She has issues with managing her thyroid still. Followed by Endocrinology  Poor PO intake due to meals hurt her stomach, causes cramps.  She feels overworked and stressed and overall not rested. She admits can go days without eating.  Working up to 12 hour shifts previously Emotional lability at times as well    Depression screen [facility] 2/9 05/23/2020 10/21/2019 06/23/2019  Decreased Interest 1 0 0  Down, Depressed, Hopeless 1 0 0  PHQ - 2 Score 2 0 0  Altered sleeping 2 0 0  Tired, decreased energy 1 0 1   Change in appetite 3 0 0  Feeling bad or failure about yourself  0 0 0  Trouble concentrating 2 0 0  Moving slowly or fidgety/restless 1 0 0  Suicidal thoughts 0 0 0  PHQ-9 Score 11 0 1  Difficult doing work/chores Very difficult Not difficult at all Not difficult at all  Some recent data might be hidden    Social History   Tobacco Use  . Smoking status: Former Smoker    Packs/day: 0.50    Types: Cigarettes    Quit date: 04/02/2018    Years since quitting: 2.1  . Smokeless tobacco: Former Clinical biochemist  . Vaping Use: Never used  Substance Use Topics  . Alcohol use: No  . Drug use: No    Review of Systems Per HPI unless specifically indicated above     Objective:    BP 100/62 (BP Location: Left Arm, Patient Position: Sitting, Cuff Size: Normal)   Pulse 64   Resp 15   Ht 5\' 5"  (1.651 m)   Wt 180 lb 9.6 oz (81.9 kg)   BMI 30.05 kg/m   Wt Readings from Last 3 Encounters:  05/23/20 180 lb 9.6 oz (81.9 kg)  04/12/20 179 lb 9.6 oz (81.5 kg)  03/18/20 179 lb 6.4 oz (81.4 kg)    Physical Exam Vitals and nursing note reviewed.  Constitutional:      General: She is not in acute distress.    Appearance:  She is well-developed. She is not diaphoretic.     Comments: Well-appearing, comfortable, cooperative  HENT:     Head: Normocephalic and atraumatic.  Eyes:     General:        Right eye: No discharge.        Left eye: No discharge.     Conjunctiva/sclera: Conjunctivae normal.  Neck:     Thyroid: No thyromegaly.  Cardiovascular:     Rate and Rhythm: Normal rate and regular rhythm.     Heart sounds: Normal heart sounds. No murmur heard.   Pulmonary:     Effort: Pulmonary effort is normal. No respiratory distress.     Breath sounds: Normal breath sounds. No wheezing or rales.  Musculoskeletal:        General: Normal range of motion.     Cervical back: Normal range of motion and neck supple.  Lymphadenopathy:     Cervical: No cervical adenopathy.  Skin:     General: Skin is warm and dry.     Findings: No erythema or rash.  Neurological:     Mental Status: She is alert and oriented to person, place, and time.     Comments: Appears tired  Psychiatric:        Behavior: Behavior normal.     Comments: Well groomed, good eye contact, normal speech and thoughts       Results for orders placed or performed in visit on 05/04/20  TSH  Result Value Ref Range   TSH 0.21 (L) 0.35 - 4.50 uIU/mL      Assessment & Plan:   Problem List Items Addressed This Visit    Major depression in full remission (HCC)   Insomnia   Hypothyroidism    Other Visit Diagnoses    Vasovagal syncope    -  Primary   Early satiety       Appetite loss          #Vasovagal Syncope #Insomnia Currently being worked up by [facility] Neurology and Cardiology Upcoming EEG,still needs to be done Upcoming sleep study Currently determined by specialists still likely vasovagal etiology Clinically consistent with occurring with episodes of emotional and physical stress and exhaustion Also has family history similar with father having episodes  Today discussed that she may return to work with restrictions. However if cannot work with restrictions then she will be out on short term disability.  Today will write new letter today, given to patient with her work restrictions, RETURN Tomorrow on Tues 05/24/20 however with still restrictions with max 5 days a week, 8 hours a day 40 hours a week, avoid heavy lifting >15 lbs.  Will recommend work restrictions extend for 3 months through 08/23/20. Anticipated return date with full duty no restrictions on 08/24/20.  Will complete her Return to Work Form for [employer] and fax it back.   Early Satiety Poor PO Chronic postprandial abdominal pain She is s/p cholecystectomy, history of ruptured appendix non surgical management 2015 or earlier Prior reported history of EGD dx with GERD but she does not recall when or where,  somewhere in [place]  problems today discussed, may be major contributing factor to her recurrent vasovagal syncope episodes and her constellation of problem that are impacting her now.  Suspect some early satiety is impacting her daily nutrition and impacting her function. Continue Boost, supplement, vitamins, hydration, gatorade G2  Followed by GI specialist, last seen 04/2020  No orders of the defined types were placed in this encounter.  Follow up plan: Return in about 3 months (around 08/23/2020) for 3 month follow-up Return to work - insomnia, near syncopal.   [name], DO [facility] 05/23/2020, 9:58 AM

## 2020-05-25 ENCOUNTER — Ambulatory Visit: Payer: BC Managed Care – PPO | Admitting: Gastroenterology

## 2020-06-09 ENCOUNTER — Ambulatory Visit: Payer: Medicaid Other | Admitting: Medical

## 2020-07-21 ENCOUNTER — Other Ambulatory Visit: Payer: Self-pay

## 2020-07-21 ENCOUNTER — Ambulatory Visit (INDEPENDENT_AMBULATORY_CARE_PROVIDER_SITE_OTHER): Payer: BC Managed Care – PPO | Admitting: Gastroenterology

## 2020-07-21 VITALS — BP 118/79 | HR 82 | Temp 97.7°F | Ht 65.0 in | Wt 178.0 lb

## 2020-07-21 DIAGNOSIS — K5900 Constipation, unspecified: Secondary | ICD-10-CM | POA: Diagnosis not present

## 2020-07-21 DIAGNOSIS — K219 Gastro-esophageal reflux disease without esophagitis: Secondary | ICD-10-CM

## 2020-07-21 MED ORDER — POLYETHYLENE GLYCOL 3350 17 GM/SCOOP PO POWD: 17.0000 g | Freq: Every day | ORAL | 2 refills | Status: DC

## 2020-07-21 NOTE — Progress Notes (Signed)
Melodie Bouillon, MD [address]  [address]  [place], [place] [address]  Main: [phone]  Fax: [phone]   Primary Care Physician: Smitty Cords, DO   Chief Complaint  Patient presents with   Follow-up    Pt has tried to change eating habits, eating more smaller meals... still feels full and bloated  after eating... noticed some improvement... BM every other day currently... Denies N/V    HPI: Kari Gallagher is a 40 y.o. female here for follow-up of abdominal pain.  Patient was prescribed PPI on last visit and with this her abdominal pain has improved.  However, is reporting constipation with bowel movements every other day and some abdominal bloating and bilateral lower quadrant abdominal discomfort due to this.  No weight loss.  No nausea or vomiting.  No blood in stool.  Good appetite.   ROS: All ROS reviewed and negative except as per HPI   Past Medical History:  Diagnosis Date   Frequent headaches    Hypothyroidism    Leaky heart valve     Past Surgical History:  Procedure Laterality Date   CHOLECYSTECTOMY     TUBAL LIGATION  2006    Prior to Admission medications   Medication Sig Start Date End Date Taking? Authorizing Provider  furosemide (LASIX) 20 MG tablet Take 1 tablet (20 mg total) by mouth daily as needed for fluid or edema. 06/23/19  Yes Karamalegos, Netta Neat, DO  Multiple Vitamin (MULTIVITAMIN WITH MINERALS) TABS tablet Take 1 tablet by mouth daily.   Yes [provider]  polyethylene glycol powder (GLYCOLAX/MIRALAX) 17 GM/SCOOP powder Take 17 g by mouth daily. 07/21/20  Yes Niccolo Burggraf, Michel Bickers B, MD  potassium chloride (KLOR-CON) 10 MEQ tablet Take 10 mEq by mouth daily as needed. 06/23/19  Yes [provider]  SYNTHROID 125 MCG tablet Take 1 tablet (125 mcg total) by mouth daily before breakfast. 04/22/20  Yes Shamleffer, Konrad Dolores, MD  traZODone (DESYREL) 100 MG tablet Take 1 tablet (100 mg total) by mouth  at bedtime as needed for sleep. Start with half tablet (50mg ) for up to 1 week, then increase to 1 whole tablet 06/23/19  Yes Karamalegos, 06/25/19, DO    Family History  Problem Relation Age of Onset   Stroke Mother    Depression Mother    Mental illness Mother    Heart disease Father    Thyroid disease Father    Lung cancer Father    Thyroid disease Brother      Social History   Tobacco Use   Smoking status: Former    Packs/day: 0.50    Types: Cigarettes    Quit date: 04/02/2018    Years since quitting: 2.3   Smokeless tobacco: Former  06/02/2018 Use: Never used  Substance Use Topics   Alcohol use: No   Drug use: No    Allergies as of 07/21/2020   (No Known Allergies)    Physical Examination:  Constitutional: General:   Alert,  Well-developed, well-nourished, pleasant and cooperative in NAD BP 118/79   Pulse 82   Temp 97.7 F (36.5 C) (Oral)   Ht 5\' 5"  (1.651 m)   Wt 178 lb (80.7 kg)   BMI 29.62 kg/m   Respiratory: Normal respiratory effort  Gastrointestinal:  Soft, non-tender and non-distended without masses, hepatosplenomegaly or hernias noted.  No guarding or rebound tenderness.     Cardiac: No clubbing or edema.  No cyanosis. Normal posterior  tibial pedal pulses noted.  Psych:  Alert and cooperative. Normal mood and affect.  Musculoskeletal:  Normal gait. Head normocephalic, atraumatic. Symmetrical without gross deformities. 5/5 Lower extremity strength bilaterally.  Skin: Warm. Intact without significant lesions or rashes. No jaundice.  Neck: Supple, trachea midline  Lymph: No cervical lymphadenopathy  Psych:  Alert and oriented x3, Alert and cooperative. Normal mood and affect.  Labs: CMP     Component Value Date/Time   NA 137 02/14/2019 1132   NA 138 02/03/2018 1043   NA 137 01/20/2014 1649   K 4.0 02/14/2019 1132   K 4.1 01/20/2014 1649   CL 105 02/14/2019 1132   CL 104 01/20/2014 1649   CO2 24 02/14/2019 1132   CO2 26  01/20/2014 1649   GLUCOSE 98 02/14/2019 1132   GLUCOSE 141 (H) 01/20/2014 1649   BUN 12 02/14/2019 1132   BUN 9 02/03/2018 1043   BUN 11 01/20/2014 1649   CREATININE 0.98 02/14/2019 1132   CREATININE 0.90 04/10/2016 0001   CALCIUM 9.2 02/14/2019 1132   CALCIUM 9.4 01/20/2014 1649   PROT 8.4 (H) 02/14/2019 1132   PROT 7.6 02/03/2018 1043   PROT 8.1 01/20/2014 1649   ALBUMIN 4.2 02/14/2019 1132   ALBUMIN 4.4 02/03/2018 1043   ALBUMIN 3.7 01/20/2014 1649   AST 19 02/14/2019 1132   AST 48 (H) 01/20/2014 1649   ALT 16 02/14/2019 1132   ALT 40 01/20/2014 1649   ALKPHOS 95 02/14/2019 1132   ALKPHOS 180 (H) 01/20/2014 1649   BILITOT 0.5 02/14/2019 1132   BILITOT 0.3 02/03/2018 1043   BILITOT 0.3 01/20/2014 1649   GFRNONAA >60 02/14/2019 1132   GFRNONAA 83 04/10/2016 0001   GFRAA >60 02/14/2019 1132   GFRAA >89 04/10/2016 0001   Lab Results  Component Value Date   WBC 7.0 02/14/2019   HGB 13.7 02/14/2019   HCT 42.2 02/14/2019   MCV 92.5 02/14/2019   PLT 214 02/14/2019    Imaging Studies:   Assessment and Plan:   Kari Gallagher is a 40 y.o. y/o female here for follow-up of abdominal pain  Previous abdominal pain has improved with PPI therapy that she completed for reflux  Patient educated extensively on acid reflux lifestyle modification, including buying a bed wedge, not eating 3 hrs before bedtime, diet modifications, and handout given for the same.    She is now reporting constipation  High-fiber diet MiraLAX daily with goal of 1-2 soft bowel movements daily.  If not at goal, patient instructed to increase dose to twice daily.  If loose stools with the medication, patient asked to decrease the medication to every other day, or half dose daily.  Patient verbalized understanding  If symptoms do not improve in the next 3 to 4 weeks, patient advised to call us back and she verbalized understanding  No alarm symptoms present at this time    Dr Melodie Bouillon

## 2020-08-25 ENCOUNTER — Ambulatory Visit (INDEPENDENT_AMBULATORY_CARE_PROVIDER_SITE_OTHER): Payer: BC Managed Care – PPO | Admitting: Internal Medicine

## 2020-08-25 ENCOUNTER — Other Ambulatory Visit: Payer: Self-pay

## 2020-08-25 ENCOUNTER — Telehealth: Payer: Self-pay

## 2020-08-25 VITALS — BP 122/80 | HR 90 | Ht 66.0 in | Wt 174.2 lb

## 2020-08-25 DIAGNOSIS — E559 Vitamin D deficiency, unspecified: Secondary | ICD-10-CM | POA: Diagnosis not present

## 2020-08-25 DIAGNOSIS — E890 Postprocedural hypothyroidism: Secondary | ICD-10-CM | POA: Diagnosis not present

## 2020-08-25 LAB — VITAMIN D 25 HYDROXY (VIT D DEFICIENCY, FRACTURES): VITD: 23.49 ng/mL — ABNORMAL LOW (ref 30.00–100.00)

## 2020-08-25 LAB — TSH: TSH: 1.9 u[IU]/mL (ref 0.35–5.50)

## 2020-08-25 MED ORDER — SYNTHROID 125 MCG PO TABS: 125.0000 ug | ORAL_TABLET | Freq: Every day | ORAL | 2 refills | Status: DC

## 2020-08-25 NOTE — Progress Notes (Signed)
Name: Kari Gallagher  MRN/ DOB: [id number], 04-13-1980    Age/ Sex: 40 y.o., female     PCP: Smitty Cords, DO   Reason for Endocrinology Evaluation: Hypothyroidism     Initial Endocrinology Clinic Visit: 02/02/2019    PATIENT IDENTIFIER: Kari Gallagher is a 40 y.o., female with a past medical history of migraine headaches, hypothyroidism and prediabetes.  She has followed with [facility] since 02/02/2019 for consultative assistance with management of her Hypothyroidism.   HISTORICAL SUMMARY: The patient was first diagnosed with  hyperthyroidism in 2014 . She is S/P thyroid uptake and scan in 2014 and in 2016 with 24- hr increase uptake consistent with Graves' Disease.  She is S/P RAI ablation in 2016 followed by hypothyroidism (records not available)       SUBJECTIVE:   Today (08/25/2020):  Kari Gallagher is here for a follow up on hypothyroidism.   She had missed 1 day of synthroid  Lost baby brother  Mother with vascular dementia   Weight continues to trend down  Denies diarrhea  Has occasional palpitations  Has been evaluated by neurology and cardiology dx with vasovagal   No local neck symptoms  LMP 08/03/2020     Synthroid  125 mcg daily  Vitamin D 1000 iu daily    HISTORY:  Past Medical History:  Past Medical History:  Diagnosis Date   Frequent headaches    Hypothyroidism    Leaky heart valve    Past Surgical History:  Past Surgical History:  Procedure Laterality Date   CHOLECYSTECTOMY     TUBAL LIGATION  2006   Social History:  reports that she quit smoking about 2 years ago. Her smoking use included cigarettes. She smoked an average of .5 packs per day. She has quit using smokeless tobacco. She reports that she does not drink alcohol and does not use drugs. Family History:  Family History  Problem Relation Age of Onset   Stroke Mother    Depression Mother    Mental illness Mother    Heart disease Father    Thyroid  disease Father    Lung cancer Father    Thyroid disease Brother      HOME MEDICATIONS: Allergies as of 08/25/2020   No Known Allergies      Medication List        Accurate as of August 25, 2020  9:03 AM. If you have any questions, ask your nurse or doctor.          furosemide 20 MG tablet Commonly known as: LASIX Take 1 tablet (20 mg total) by mouth daily as needed for fluid or edema.   multivitamin with minerals Tabs tablet Take 1 tablet by mouth daily.   polyethylene glycol powder 17 GM/SCOOP powder Commonly known as: GLYCOLAX/MIRALAX Take 17 g by mouth daily.   potassium chloride 10 MEQ tablet Commonly known as: KLOR-CON Take 10 mEq by mouth daily as needed.   Synthroid 125 MCG tablet Generic drug: levothyroxine Take 1 tablet (125 mcg total) by mouth daily before breakfast.   traZODone 100 MG tablet Commonly known as: DESYREL Take 1 tablet (100 mg total) by mouth at bedtime as needed for sleep. Start with half tablet (50mg ) for up to 1 week, then increase to 1 whole tablet          OBJECTIVE:   PHYSICAL EXAM: VS:BP 122/80 (BP Location: Left Arm, Patient Position: Sitting, Cuff Size: Normal)   Pulse 90  Ht 5\' 6"  (1.676 m)   Wt 174 lb 3.2 oz (79 kg)   SpO2 99%   BMI 28.12 kg/m     EXAM: General: Pt appears well and is in NAD Thick dark hairs noted aver the beard/chin area   Neck: General: Supple without adenopathy. Thyroid: No goiter or nodules appreciated  Lungs: Clear with good BS bilat with no rales, rhonchi, or wheezes  Heart: Auscultation: RRR.  Abdomen: Normoactive bowel sounds, soft, nontender, without masses or organomegaly palpable  Extremities:  BL LE: Trace pretibial edema  Mental Status: Judgment, insight: Intact Orientation: Oriented to time, place, and person Mood and affect: No depression, anxiety, or agitation     DATA REVIEWED: Results for Kari Gallagher, Kari Gallagher (MRN [name]) as of 08/25/2020 13:10  Ref. Range 08/25/2020  09:11  VITD Latest Ref Range: 30.00 - 100.00 ng/mL 23.49 (L)  TSH Latest Ref Range: 0.35 - 5.50 uIU/mL 1.90    ASSESSMENT / PLAN / RECOMMENDATIONS:   Postablative hypothyroidism    - She is clinically and biochemically euthyroid - No local neck symptoms  - Historically with compliance issues, hence the fluctuating TSH . Recently more compliant    Medications :    Continue Synthroid 125 mcg daily    2. Grave's Disease:   No extra thyroidal manifestations of graves' disease     3. Vitamin D deficiency:   -On her last visit she told me she is on 2000 IUs of vitamin D 3 but today she confirms she is on 1000.  Vitamin D continues to be low will increase as below    Medication Vitamin D3 2000 IU daily  F/U in 6 months  Labs in 8 weeks    Signed electronically by: 2001, MD  [facility]  [facility] [address] [place] [place], [place] Phone: [phone] FAX: [phone]      CC: [phone], DO [address] [place] Phone: [phone]  Fax: [phone]   Return to Endocrinology clinic as below: Future Appointments  Date Time Provider Department Center  10/26/2020  1:45 PM 10/28/2020, MD [facility] None

## 2020-08-25 NOTE — Telephone Encounter (Signed)
Copied from CRM [id number]. Topic: General - Other >> Aug 25, 2020  1:23 PM Gwenlyn Fudge wrote: Reason for CRM: Pt calling in regarding the letter she received from PCP Today. She states that the form was not completed correctly and she is needing to have it filled out in order to return to work. She states that it is missing a return to work date with no restrictions on their specific form. She is requesting to have this taken care of asap. Please advise.    08/25/20 at 2:42pm- I spoke briefly with the patient to get clarification as to what we need to do.   I see in her chart that Dr.K had written a letter and stated a return to work date and that she had no restrictions back on 05/23/20... and that she could return around 08/23/20.  She said that is was nothing on our end that was the issue.   Then it became difficult to hear her as she seemed to be speaking to someone else then it disconnected.   Not sure if she will need to call back but everything she needs to know is in the letter that Dr. Kirtland Bouchard wrote on 05/23/20.

## 2020-08-31 NOTE — Telephone Encounter (Signed)
Disregard this last message.  This should be resolved. Rachell has brought this form to my attention and it was already completed earlier today  [name], DO [facility] 08/31/2020, 6:50 PM

## 2020-08-31 NOTE — Telephone Encounter (Deleted)
I am no longer sure what she needs.  We resolved this issue last week on Thurs 8/25, I re wrote her letter with new return to work date without restrictions as requested.  I have no other paperwork for her at this time.  [name], DO [facility] 08/31/2020, 6:49 PM

## 2020-08-31 NOTE — Telephone Encounter (Signed)
Pt called in stating she needs this form urgently so she could get back to work, please advise.

## 2020-10-26 ENCOUNTER — Ambulatory Visit: Payer: BC Managed Care – PPO | Admitting: Gastroenterology

## 2021-02-27 ENCOUNTER — Other Ambulatory Visit: Payer: Self-pay

## 2021-02-27 ENCOUNTER — Ambulatory Visit: Payer: Medicaid Other | Admitting: Internal Medicine

## 2021-03-01 ENCOUNTER — Other Ambulatory Visit: Payer: Self-pay

## 2021-03-01 ENCOUNTER — Ambulatory Visit (INDEPENDENT_AMBULATORY_CARE_PROVIDER_SITE_OTHER): Payer: Medicaid Other | Admitting: Internal Medicine

## 2021-03-01 ENCOUNTER — Encounter: Payer: Self-pay | Admitting: Internal Medicine

## 2021-03-01 VITALS — BP 104/68 | HR 84 | Ht 66.0 in | Wt 164.6 lb

## 2021-03-01 DIAGNOSIS — E890 Postprocedural hypothyroidism: Secondary | ICD-10-CM | POA: Diagnosis not present

## 2021-03-01 LAB — TSH: TSH: 1.61 u[IU]/mL (ref 0.35–5.50)

## 2021-03-01 LAB — T4, FREE: Free T4: 0.88 ng/dL (ref 0.60–1.60)

## 2021-03-01 NOTE — Progress Notes (Signed)
? ?Name: Kari Gallagher  ?MRN/ DOB: [id number], 04/29/1980    ?Age/ Sex: 41 y.o., female   ? ? ?PCP: Smitty Cords, DO   ?Reason for Endocrinology Evaluation: Hypothyroidism  ?   ?Initial Endocrinology Clinic Visit: 02/02/2019  ? ? ?PATIENT IDENTIFIER: Ms. Kari Gallagher is a 41 y.o., female with a past medical history of migraine headaches, hypothyroidism and prediabetes.  She has followed with [facility] since 02/02/2019 for consultative assistance with management of her Hypothyroidism.  ? ?HISTORICAL SUMMARY: The patient was first diagnosed with  hyperthyroidism in 2014 . She is S/P thyroid uptake and scan in 2014 and in 2016 with 24- hr increase uptake consistent with Graves' Disease.  ?She is S/P RAI ablation in 2016 followed by hypothyroidism (records not available) ?  ?  ? ? ?SUBJECTIVE:  ? ?Today (03/01/2021):  Ms. Kari Gallagher is here for a follow up on hypothyroidism.  She has been compliant with LT-4 replacement except for couple days when she had to go out of town and forgot her prescription at home. ? 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 ? ? ? ?HISTORY:  ?Past Medical History:  ?Past Medical History:  ?Diagnosis Date  ? Frequent headaches   ? Hypothyroidism   ? Leaky heart valve   ? ?Past Surgical History:  ?Past Surgical History:  ?Procedure Laterality Date  ? CHOLECYSTECTOMY    ? TUBAL LIGATION  2006  ? ?Social History:  reports that she quit smoking about 2 years ago. Her smoking use included cigarettes. She smoked an average of .5 packs per day. She has quit using smokeless tobacco. She reports that she does not drink alcohol and does not use drugs. ?Family History:  ?Family History  ?Problem Relation Age of Onset  ?  Stroke Mother   ? Depression Mother   ? Mental illness Mother   ? Heart disease Father   ? Thyroid disease Father   ? Lung cancer Father   ? Thyroid disease Brother   ? ? ? ?HOME MEDICATIONS: ?Allergies as of 03/01/2021   ?No Known Allergies ?  ? ?  ?Medication List  ?  ? ?  ? Accurate as of March 01, 2021  4:04 PM. If you have any questions, ask your nurse or doctor.  ?  ?  ? ?  ? ?furosemide 20 MG tablet ?Commonly known as: LASIX ?Take 1 tablet (20 mg total) by mouth daily as needed for fluid or edema. ?  ?multivitamin with minerals Tabs tablet ?Take 1 tablet by mouth daily. ?  ?polyethylene glycol powder 17 GM/SCOOP powder ?Commonly known as: GLYCOLAX/MIRALAX ?Take 17 g by mouth daily. ?  ?potassium chloride 10 MEQ tablet ?Commonly known as: KLOR-CON ?Take 10 mEq by mouth daily as needed. ?  ?Synthroid 125 MCG tablet ?Generic drug: levothyroxine ?Take 1 tablet (125 mcg total) by mouth daily before breakfast. ?  ?traZODone 100 MG tablet ?Commonly known as: DESYREL ?Take 1 tablet (100 mg total) by mouth at bedtime as needed for sleep. Start with half tablet (50mg ) for up to 1 week, then increase to 1 whole tablet ?  ? ?  ? ? ? ? ?OBJECTIVE:  ? ?PHYSICAL EXAM: ?VS:BP 104/68 (BP Location: Left Arm, Patient Position:  Sitting, Cuff Size: Small)   Pulse 84   Ht 5\' 6"  (1.676 m)   Wt 164 lb 9.6 oz (74.7 kg)   SpO2 95%   BMI 26.57 kg/m?  ? ? ? ?EXAM: ?General: Pt appears well and is in NAD ?Thick dark hairs noted aver the beard/chin area   ?Neck: General: Supple without adenopathy. ?Thyroid: No goiter or nodules appreciated  ?Lungs: Clear with good BS bilat with no rales, rhonchi, or wheezes  ?Heart: Auscultation: RRR.  ?Abdomen: Normoactive bowel sounds, soft, nontender, without masses or organomegaly palpable  ?Extremities:  ?BL LE: Trace pretibial edema  ?Mental Status: Judgment, insight: Intact ?Orientation: Oriented to time, place, and person ?Mood and affect: No depression, anxiety, or agitation  ? ? ? ?DATA  REVIEWED: ? Latest Reference Range & Units 03/01/21 14:03  ?TSH 0.35 - 5.50 uIU/mL 1.61  ?T4,Free(Direct) 0.60 - 1.60 ng/dL 05/01/21  ? ? ? ?ASSESSMENT / PLAN / RECOMMENDATIONS:  ? ?Postablative hypothyroidism  ?  ?- She is clinically and biochemically euthyroid ?- No local neck symptoms  ?- She has been compliant with LT-4 replacement. ?- TFT's normal, no change  ?  ?Medications : ?  ? ?Continue Synthroid 125 mcg daily  ? ? ?2. Grave's Disease: ?  ?No extra thyroidal manifestations of graves' disease ?  ? ? ? ?F/U in 1 year ? ? ? ?Signed electronically by: ?Abby 5.09, MD ? ?[facility]  ?[facility] ?[address] ?[place], [place] ?Phone: [phone] ?FAX: [phone]  ? ? ? ? ?CC: ?[phone], DO ?[address] ?[place] ?Phone: [phone]  ?Fax: [phone] ? ? ?Return to Endocrinology clinic as below: ?No future appointments. ?  ? ?

## 2021-03-02 MED ORDER — SYNTHROID 125 MCG PO TABS: 125.0000 ug | ORAL_TABLET | Freq: Every day | ORAL | 3 refills | Status: DC

## 2021-03-28 ENCOUNTER — Other Ambulatory Visit: Payer: Self-pay | Admitting: Internal Medicine

## 2021-03-28 ENCOUNTER — Encounter: Payer: Self-pay | Admitting: Internal Medicine

## 2021-03-28 MED ORDER — LEVOTHYROXINE SODIUM 125 MCG PO TABS: 125.0000 ug | ORAL_TABLET | Freq: Every day | ORAL | 3 refills | Status: DC

## 2021-05-24 ENCOUNTER — Encounter: Payer: Medicaid Other | Admitting: Certified Nurse Midwife

## 2021-05-24 DIAGNOSIS — Z01419 Encounter for gynecological examination (general) (routine) without abnormal findings: Secondary | ICD-10-CM

## 2021-06-13 IMAGING — DX LEFT TIBIA AND FIBULA - 2 VIEW
4 series · 4 of 4 positions shown · non-contrast
Comparison: None.

CLINICAL DATA: Left knee and lower extremity pain after fall at
work [DATE]. Persistent pain.

EXAM:
LEFT TIBIA AND FIBULA - 2 VIEW

[tibia ap (1 of 2)]
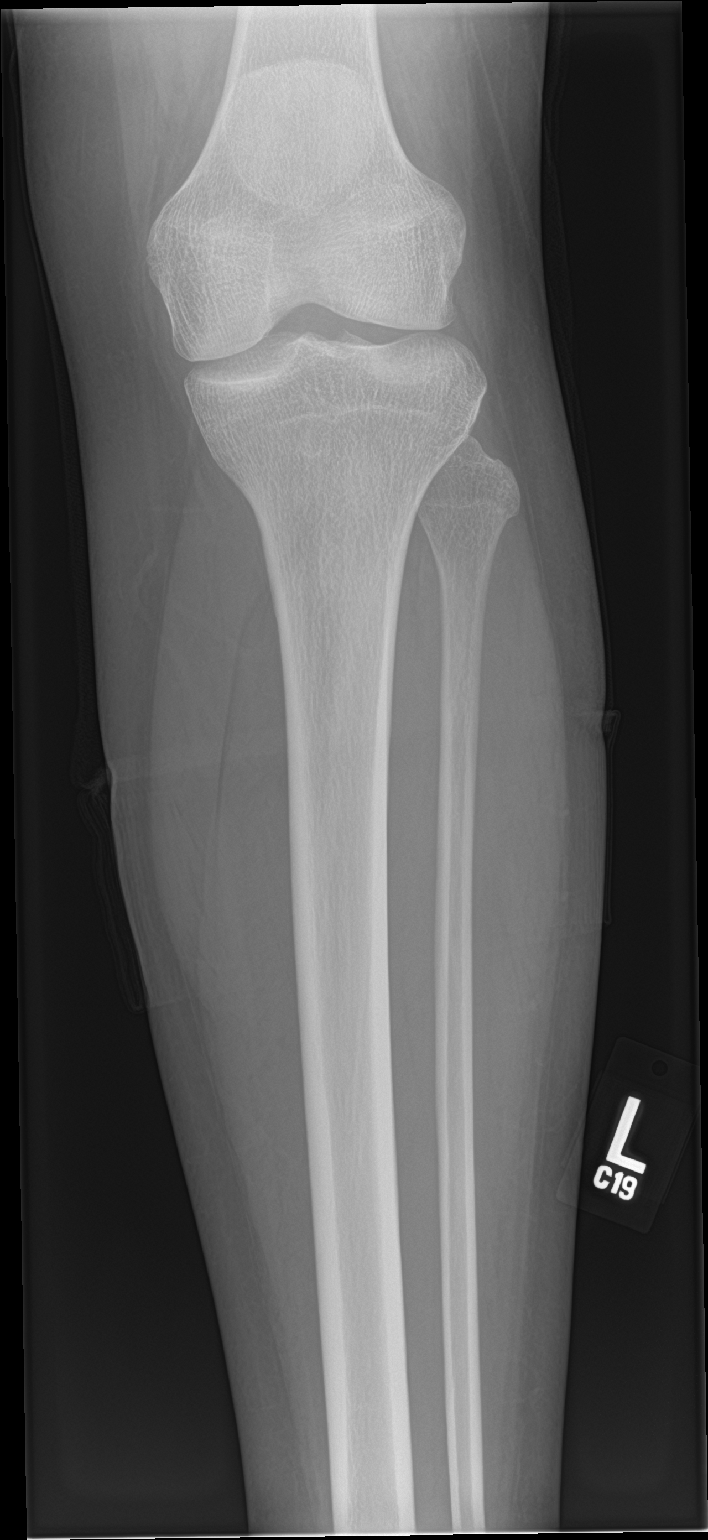

[tibia ap (2 of 2)]
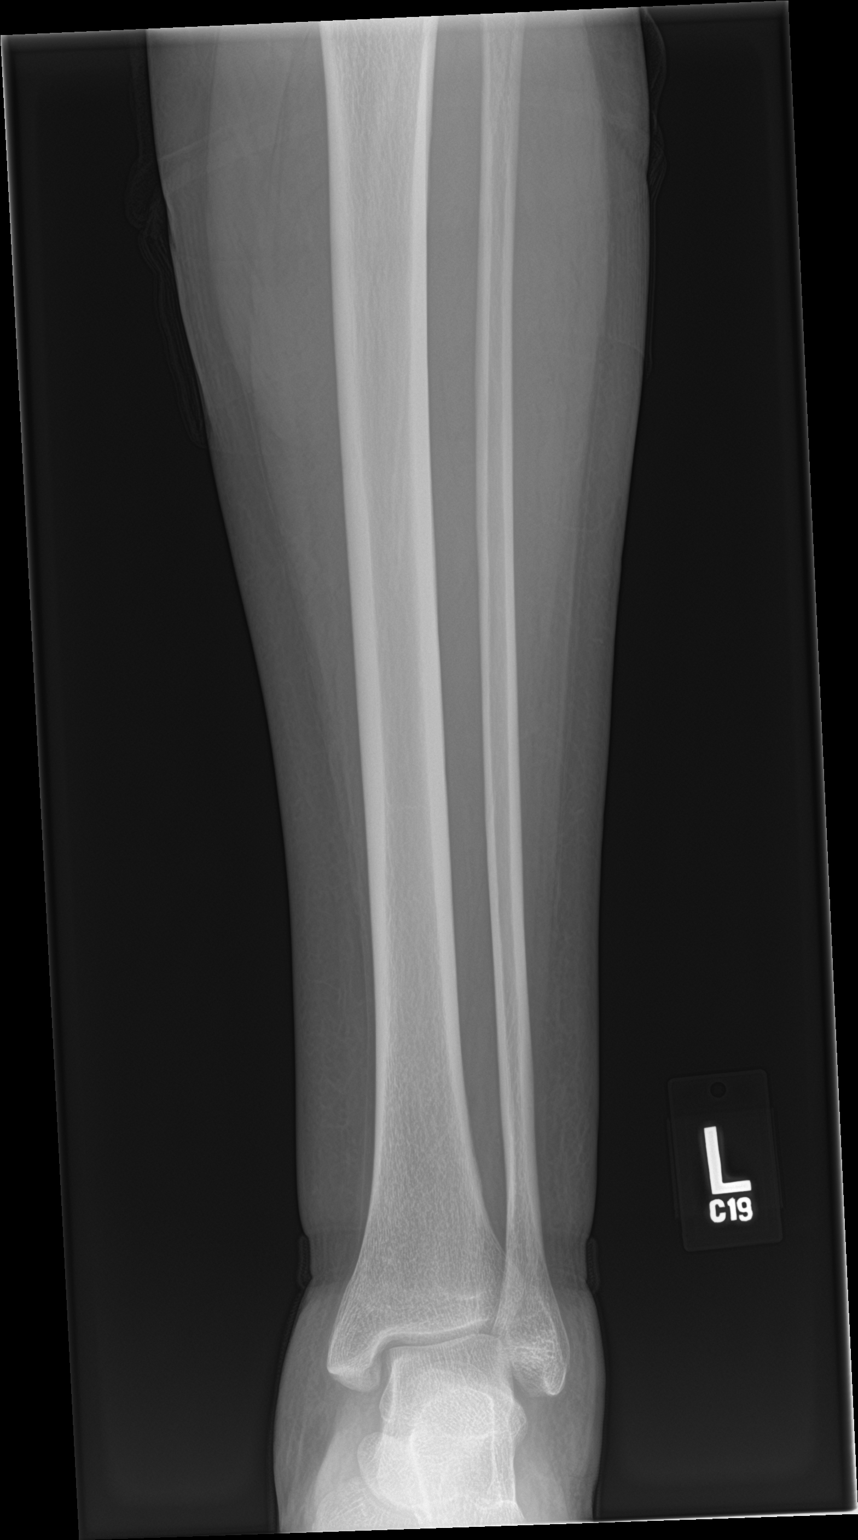

[tibia lat (1 of 2)]
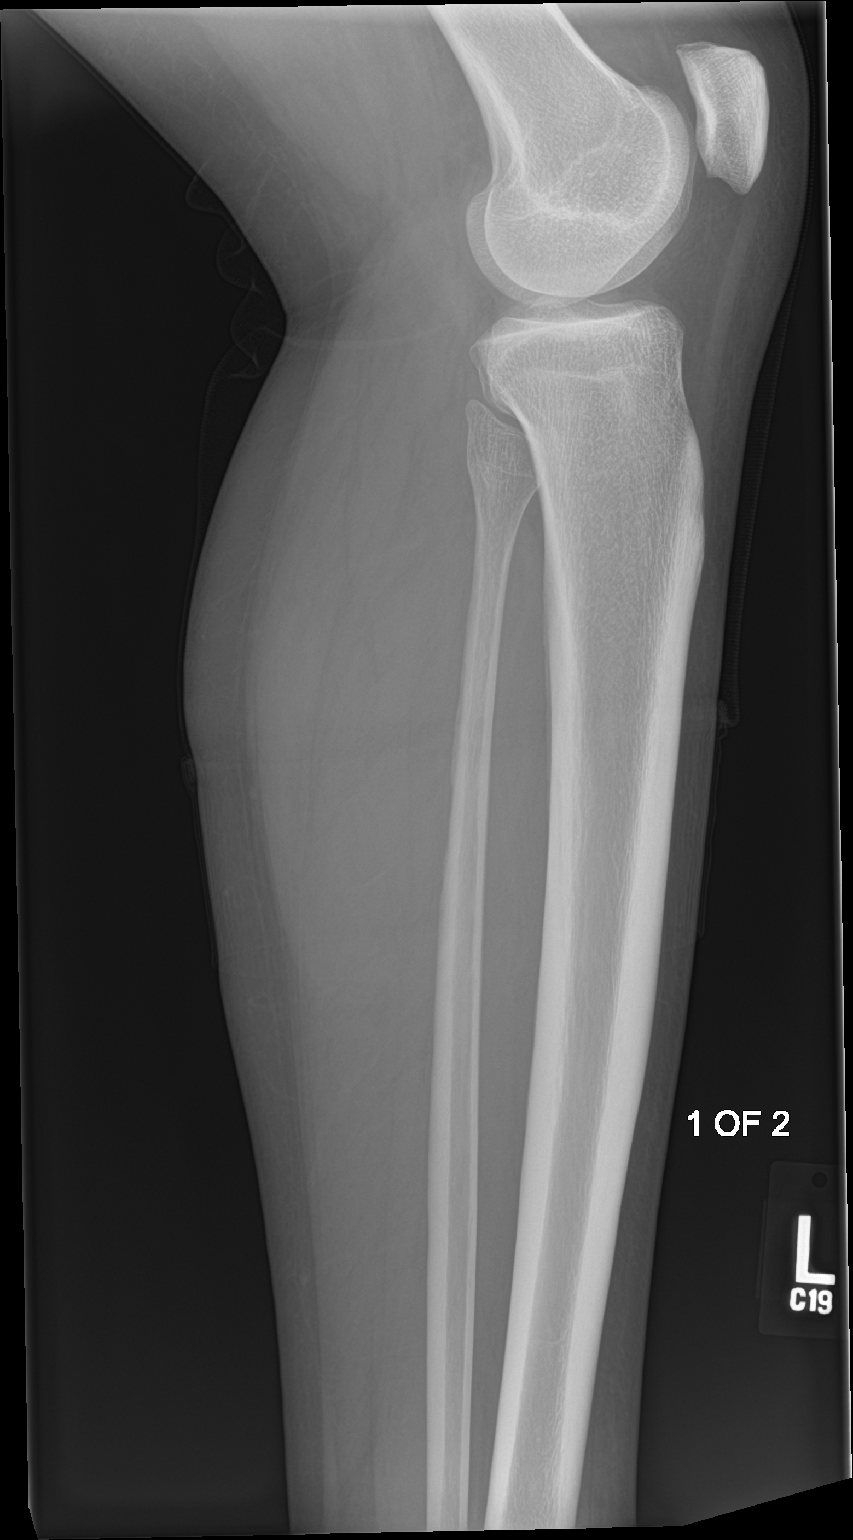

[tibia lat (2 of 2)]
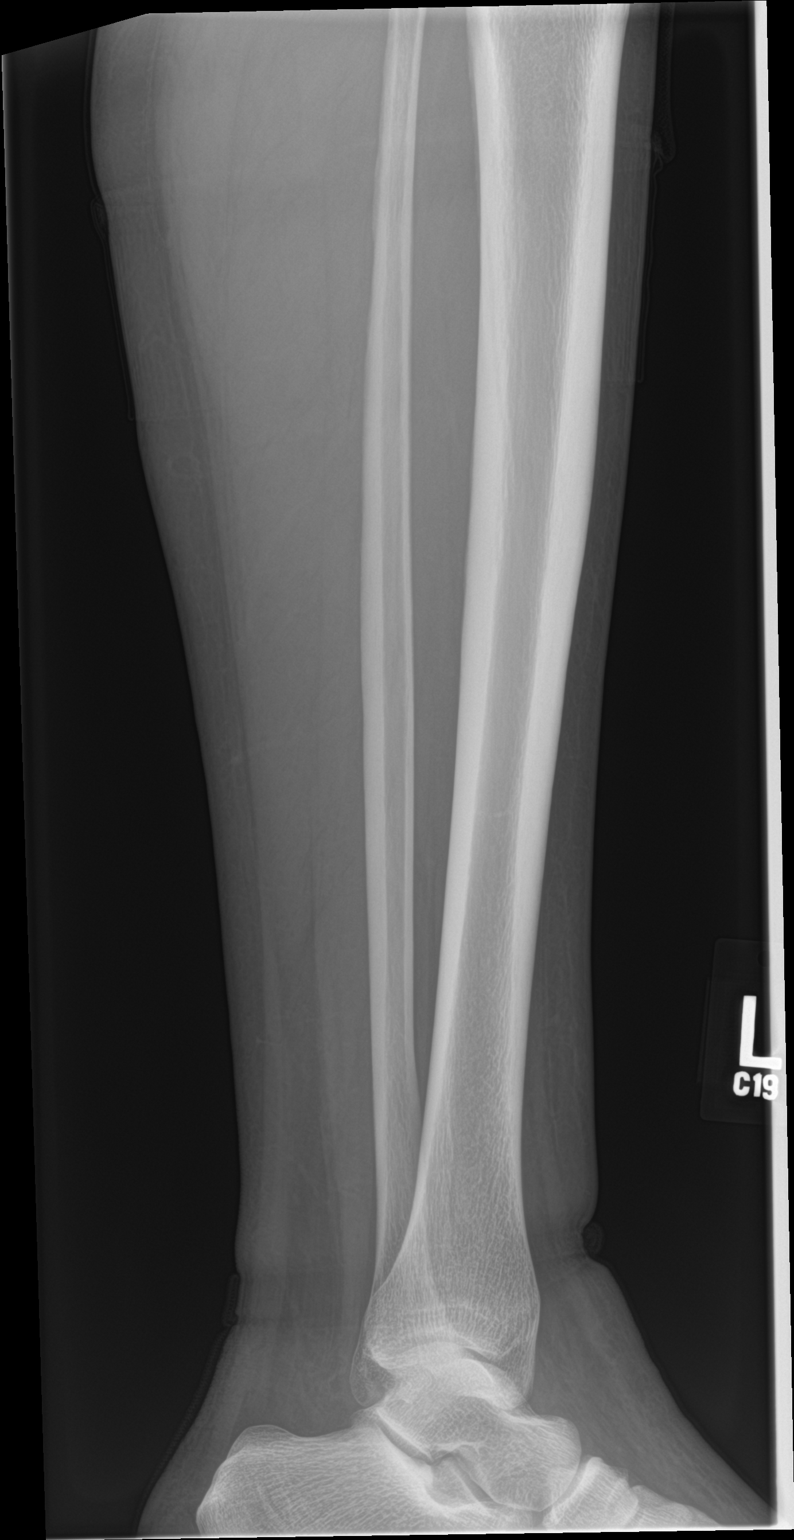

[4 of 4 positions shown; findings below may reference images not displayed]

FINDINGS: Cortical margins of the tibia and fibula are intact. There is no
evidence of fracture or other focal bone lesions. Soft tissues are
unremarkable.
IMPRESSION: Negative radiographs of the left lower leg.

## 2021-06-13 IMAGING — DX RIGHT WRIST - COMPLETE 3+ VIEW
4 series · 4 of 4 positions shown · non-contrast
Comparison: None.

CLINICAL DATA: Fell 1 month ago with subsequent pain.

EXAM:
RIGHT WRIST - COMPLETE 3+ VIEW

[wrist pa]
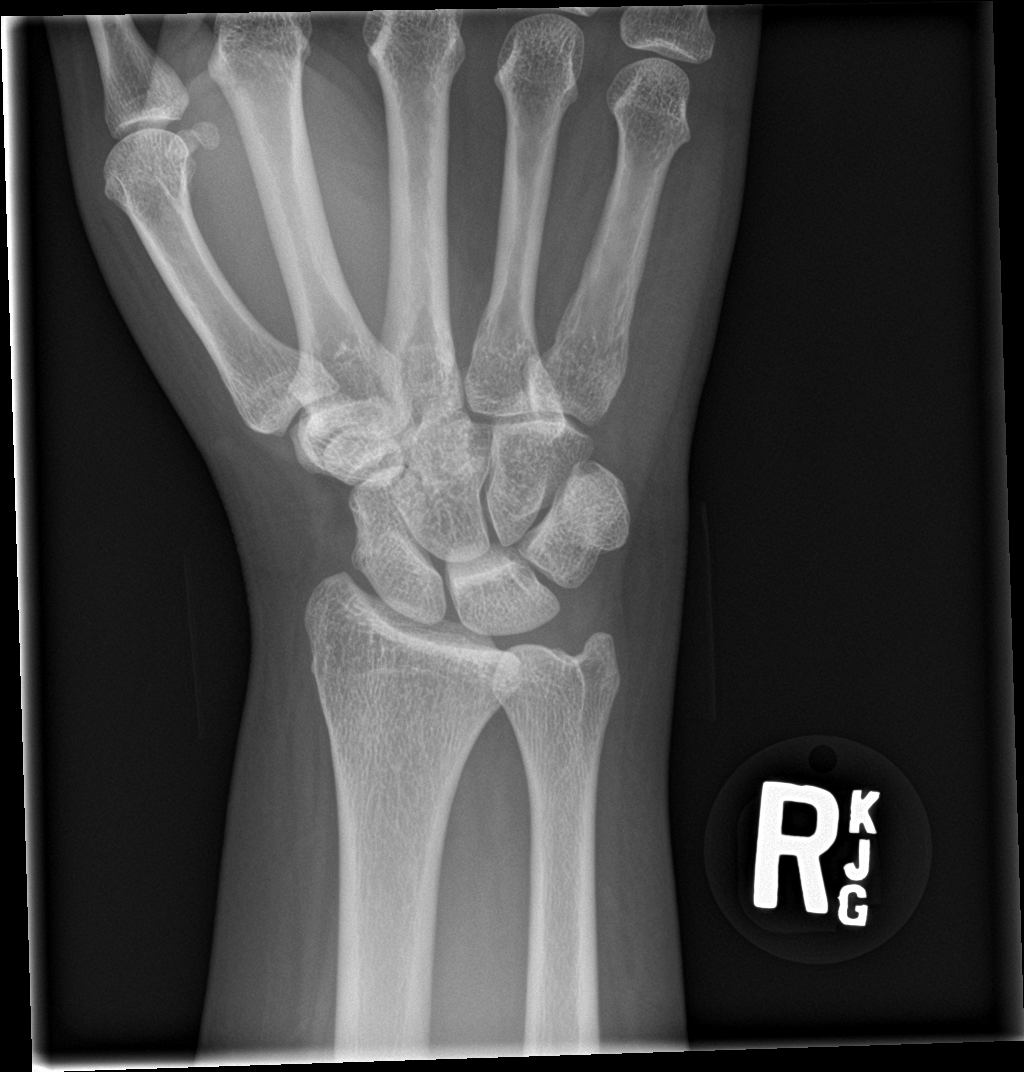

[wrist obl]
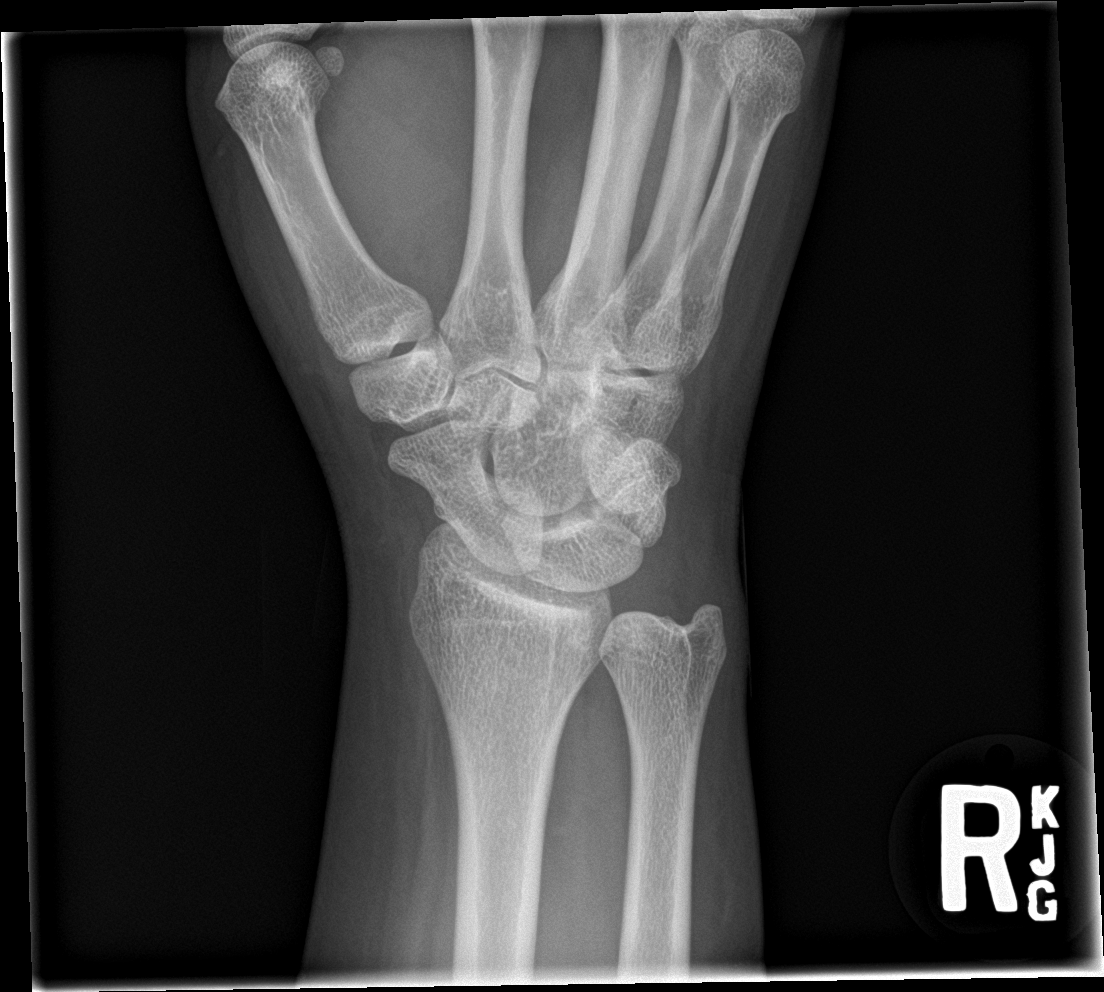

[wrist lat]
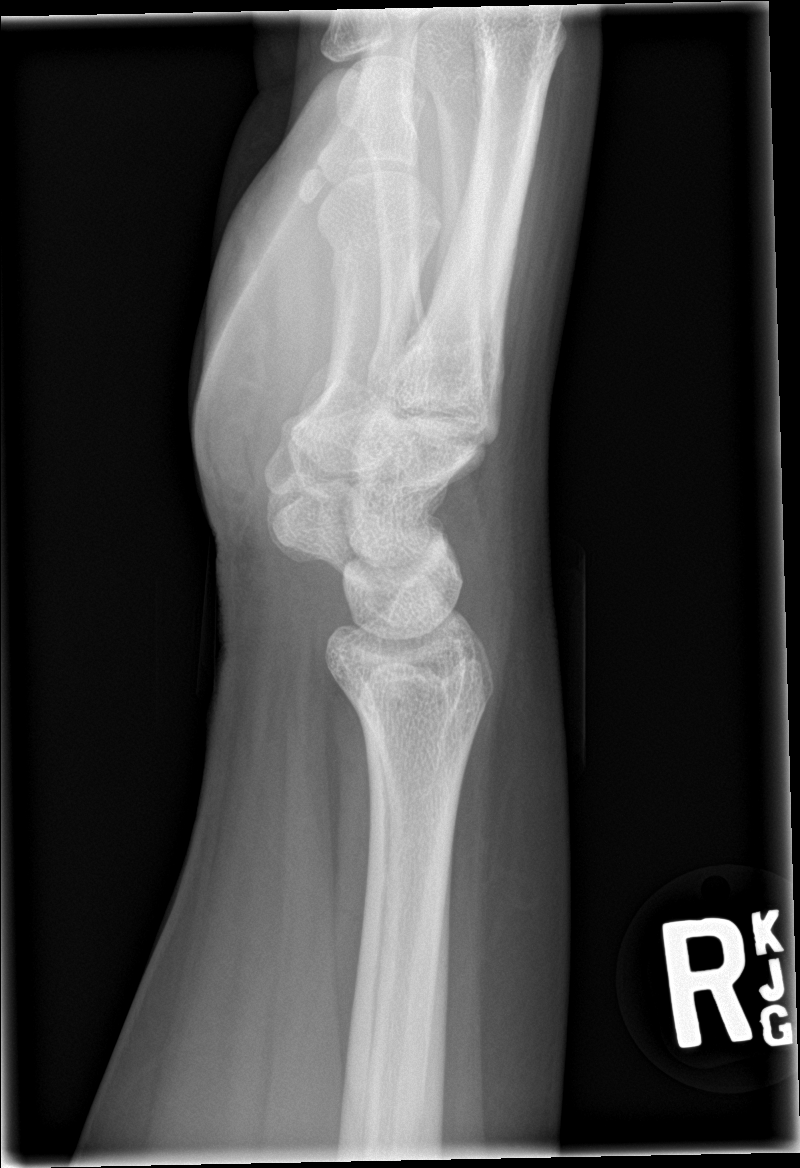

[wrist navicular]
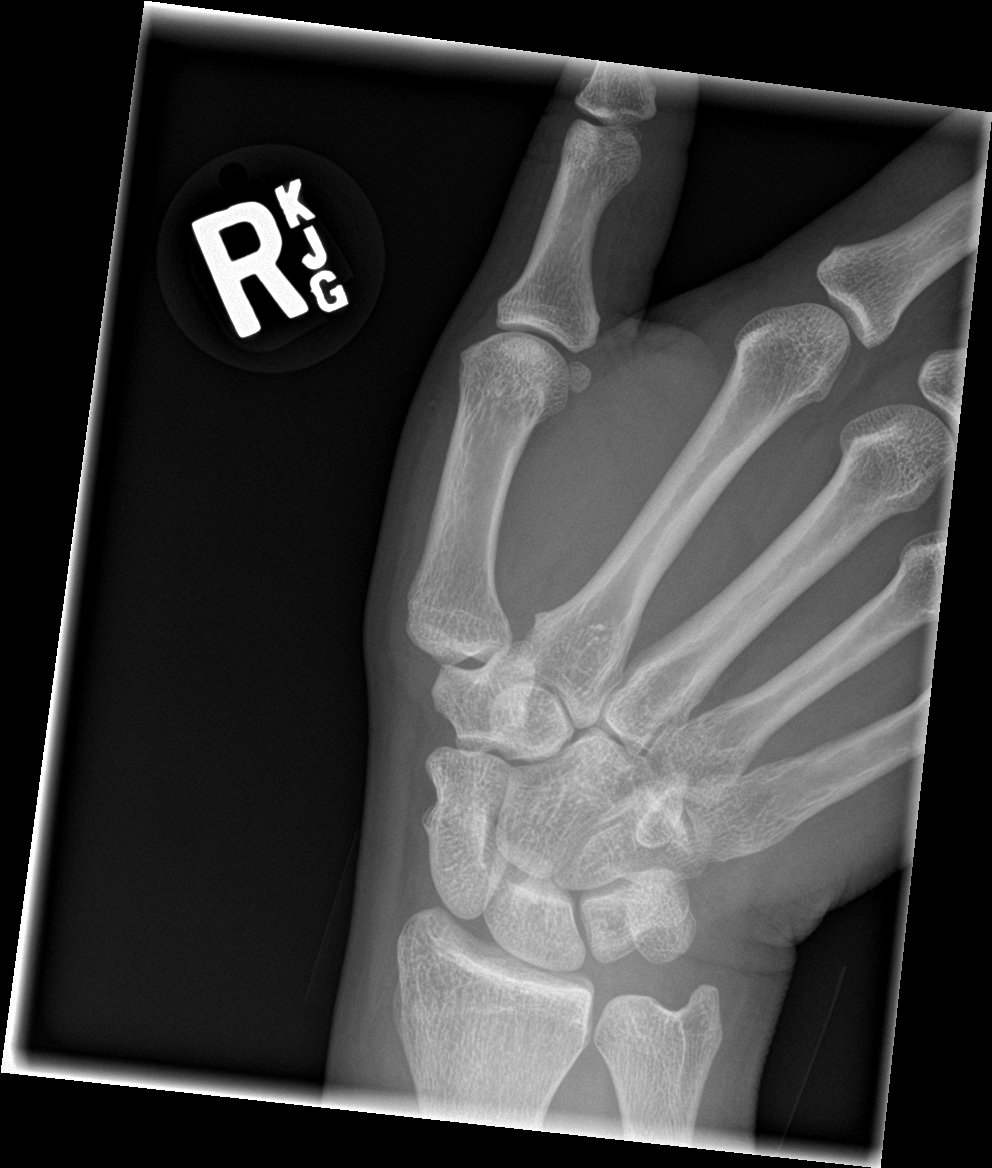

[4 of 4 positions shown; findings below may reference images not displayed]

FINDINGS: There is no evidence of fracture or dislocation. There is no
evidence of arthropathy or other focal bone abnormality. Soft
tissues are unremarkable.
IMPRESSION: Negative.

## 2021-06-13 IMAGING — DX RIGHT SHOULDER - 2+ VIEW
3 series · 3 of 3 positions shown · non-contrast
Comparison: None.

CLINICAL DATA: Fell 1 month ago with subsequent pain.

EXAM:
RIGHT SHOULDER - 2+ VIEW

[shoulder grashey]
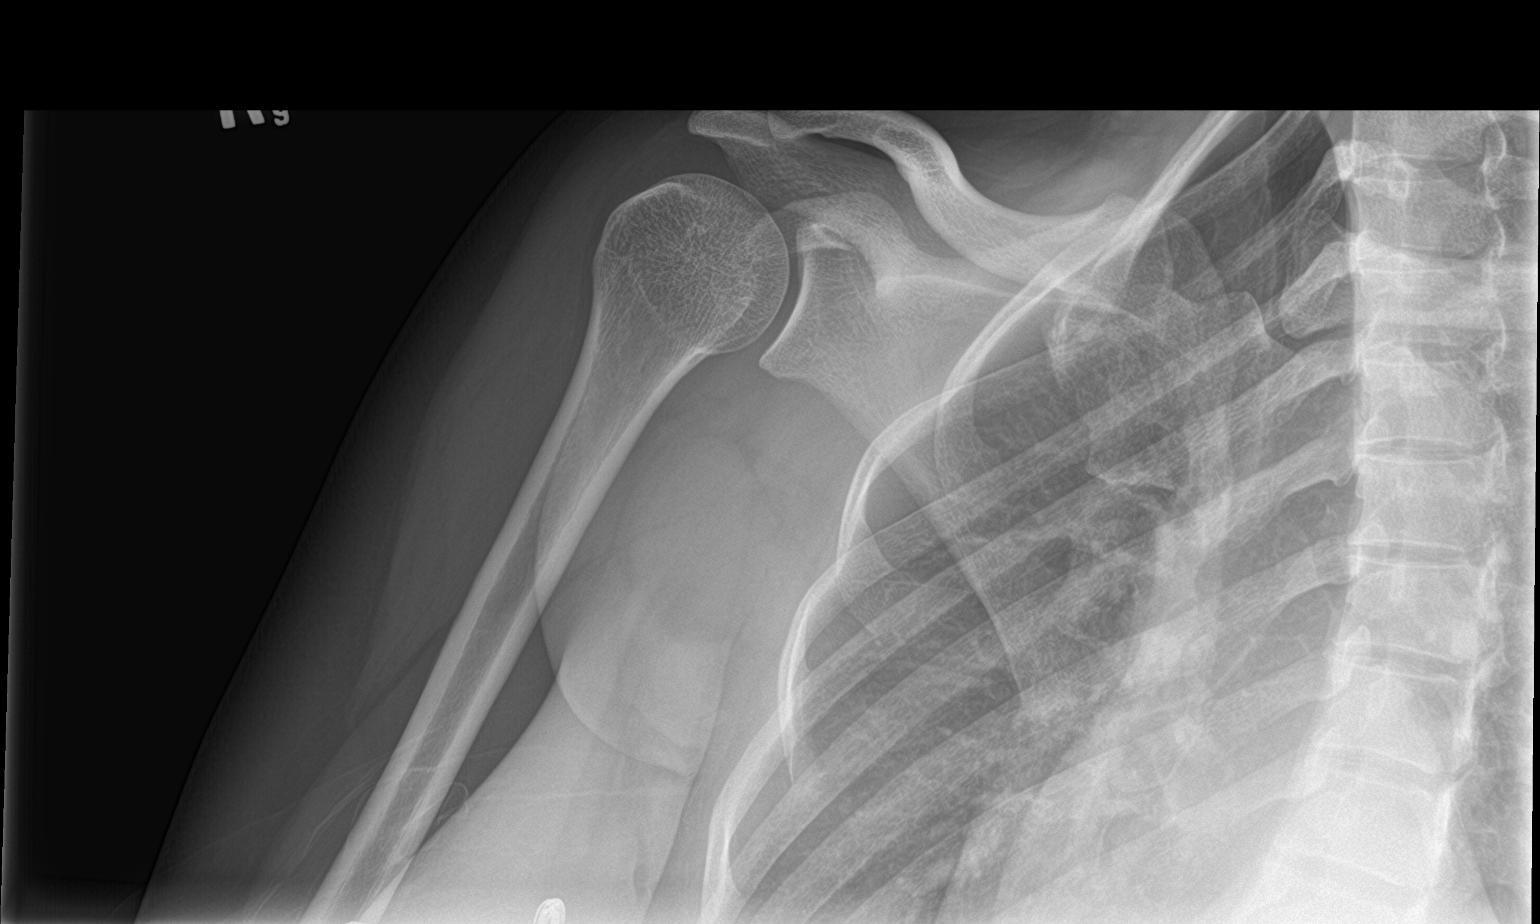

[shoulder axillary]
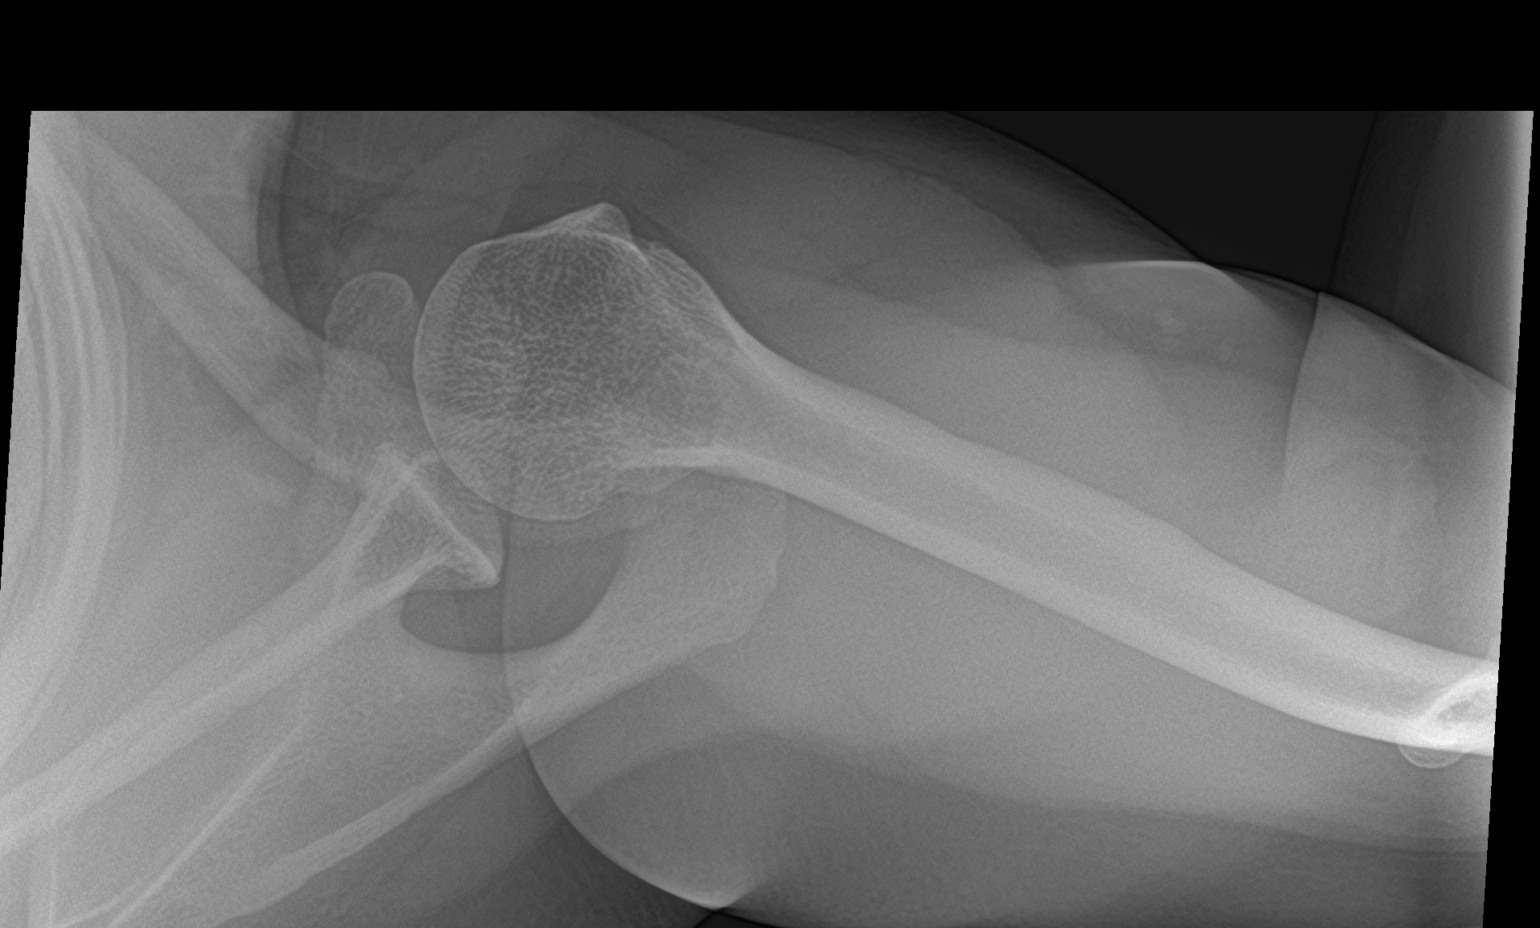

[shoulder y view]
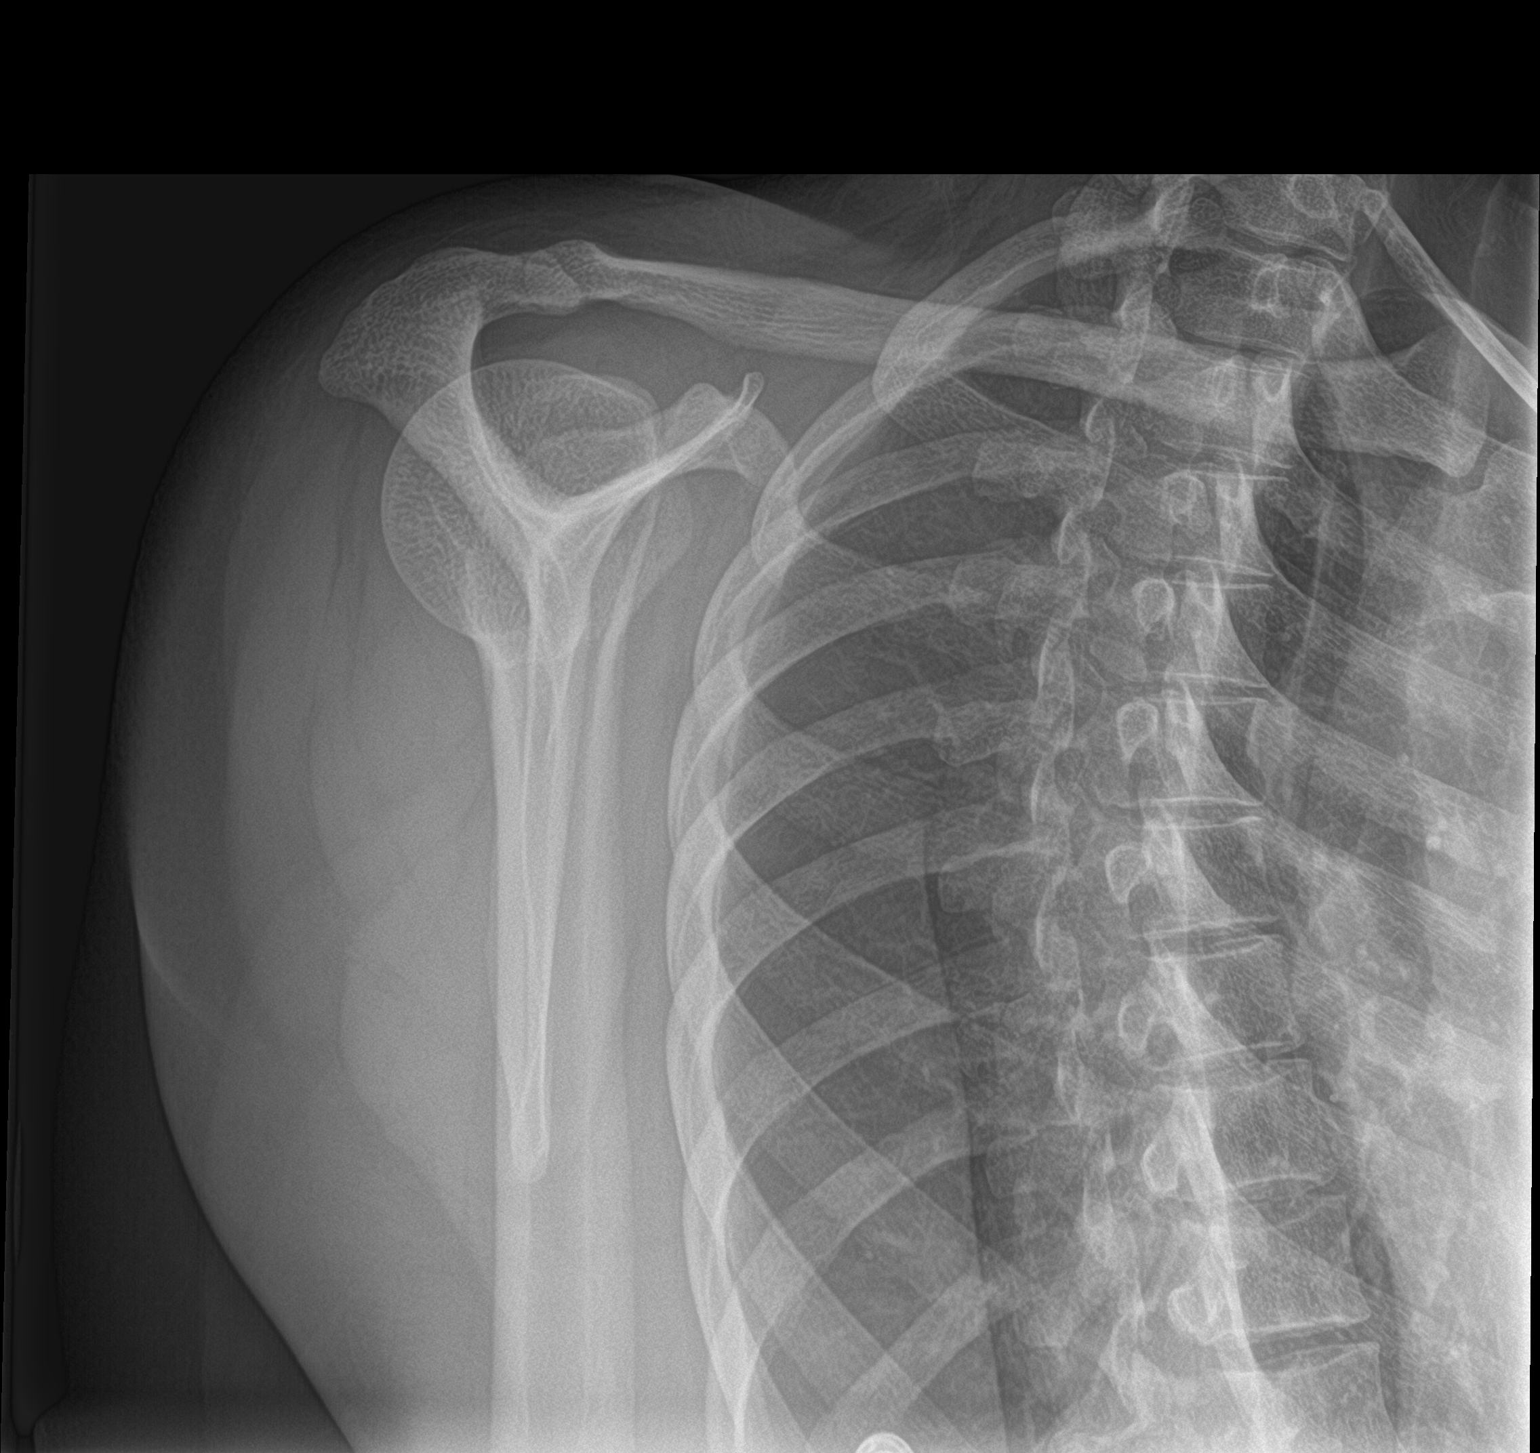

[3 of 3 positions shown; findings below may reference images not displayed]

FINDINGS: There is no evidence of fracture or dislocation. There is no
evidence of arthropathy or other focal bone abnormality. Soft
tissues are unremarkable.
IMPRESSION: Negative.

## 2021-08-29 DIAGNOSIS — N760 Acute vaginitis: Secondary | ICD-10-CM | POA: Diagnosis not present

## 2021-08-29 DIAGNOSIS — A5901 Trichomonal vulvovaginitis: Secondary | ICD-10-CM | POA: Diagnosis not present

## 2021-08-29 DIAGNOSIS — Z113 Encounter for screening for infections with a predominantly sexual mode of transmission: Secondary | ICD-10-CM | POA: Diagnosis not present

## 2021-09-26 DIAGNOSIS — Z20822 Contact with and (suspected) exposure to covid-19: Secondary | ICD-10-CM | POA: Diagnosis not present

## 2021-09-26 DIAGNOSIS — R059 Cough, unspecified: Secondary | ICD-10-CM | POA: Diagnosis not present

## 2021-09-26 DIAGNOSIS — J029 Acute pharyngitis, unspecified: Secondary | ICD-10-CM | POA: Diagnosis not present

## 2021-11-01 ENCOUNTER — Encounter (HOSPITAL_BASED_OUTPATIENT_CLINIC_OR_DEPARTMENT_OTHER): Payer: Self-pay | Admitting: Emergency Medicine

## 2021-11-01 ENCOUNTER — Emergency Department (HOSPITAL_BASED_OUTPATIENT_CLINIC_OR_DEPARTMENT_OTHER)
Admission: EM | Admit: 2021-11-01 | Discharge: 2021-11-01 | Disposition: A | Discharge status: Home / Self Care | Payer: Medicaid Other | Attending: Emergency Medicine | Admitting: Emergency Medicine

## 2021-11-01 DIAGNOSIS — N644 Mastodynia: Secondary | ICD-10-CM | POA: Diagnosis not present

## 2021-11-01 DIAGNOSIS — E039 Hypothyroidism, unspecified: Secondary | ICD-10-CM | POA: Insufficient documentation

## 2021-11-01 DIAGNOSIS — R102 Pelvic and perineal pain: Secondary | ICD-10-CM | POA: Diagnosis not present

## 2021-11-01 DIAGNOSIS — Z79899 Other long term (current) drug therapy: Secondary | ICD-10-CM | POA: Diagnosis not present

## 2021-11-01 DIAGNOSIS — N939 Abnormal uterine and vaginal bleeding, unspecified: Secondary | ICD-10-CM | POA: Insufficient documentation

## 2021-11-01 LAB — CBC WITH DIFFERENTIAL/PLATELET
Abs Immature Granulocytes: 0.01 10*3/uL (ref 0.00–0.07)
Basophils Absolute: 0 10*3/uL (ref 0.0–0.1)
Basophils Relative: 0 %
Eosinophils Absolute: 0.1 10*3/uL (ref 0.0–0.5)
Eosinophils Relative: 1 %
HCT: 34.2 % — ABNORMAL LOW (ref 36.0–46.0)
Hemoglobin: 10.7 g/dL — ABNORMAL LOW (ref 12.0–15.0)
Immature Granulocytes: 0 %
Lymphocytes Relative: 42 %
Lymphs Abs: 2.4 10*3/uL (ref 0.7–4.0)
MCH: 25.9 pg — ABNORMAL LOW (ref 26.0–34.0)
MCHC: 31.3 g/dL (ref 30.0–36.0)
MCV: 82.8 fL (ref 80.0–100.0)
Monocytes Absolute: 0.5 10*3/uL (ref 0.1–1.0)
Monocytes Relative: 9 %
Neutro Abs: 2.7 10*3/uL (ref 1.7–7.7)
Neutrophils Relative %: 48 %
Platelets: 253 10*3/uL (ref 150–400)
RBC: 4.13 MIL/uL (ref 3.87–5.11)
RDW: 14.7 % (ref 11.5–15.5)
WBC: 5.7 10*3/uL (ref 4.0–10.5)
nRBC: 0 % (ref 0.0–0.2)

## 2021-11-01 LAB — BASIC METABOLIC PANEL
Anion gap: 5 (ref 5–15)
BUN: 7 mg/dL (ref 6–20)
CO2: 27 mmol/L (ref 22–32)
Calcium: 9 mg/dL (ref 8.9–10.3)
Chloride: 105 mmol/L (ref 98–111)
Creatinine, Ser: 0.85 mg/dL (ref 0.44–1.00)
GFR, Estimated: >60 mL/min (ref >60)
Glucose, Bld: 95 mg/dL (ref 70–99)
Potassium: 3.7 mmol/L (ref 3.5–5.1)
Sodium: 137 mmol/L (ref 135–145)

## 2021-11-01 LAB — PROTIME-INR
INR: 1 (ref 0.8–1.2)
Prothrombin Time: 12.9 seconds (ref 11.4–15.2)

## 2021-11-01 MED ORDER — SODIUM CHLORIDE 0.9 % IV BOLUS
1000.0000 mL | Freq: Once | INTRAVENOUS | Status: AC
  Administered 2021-11-01: 1000 mL via INTRAVENOUS

## 2021-11-01 NOTE — Discharge Instructions (Signed)
Your exam and work-up today was overall reassuring.  I did notice small amount of old blood in the vaginal vault but no active bleeding.  Your hemoglobin was relatively stable at 10.7.  No indication for transfusion at this time.  You received IV fluids in the emergency room.  Your breast showed nodular changes all throughout.  I have given you information for the breast clinic as well as women's clinic to establish care with.  Follow-up at your scheduled primary care provider's office on 11/6.  For any concerning symptoms such as lightheadedness, heavy bleeding please return to the emergency room.  We did discuss starting birth control pills to slow down the bleeding however since you are not having heavy bleeding at this time you decided not to start these today.

## 2021-11-01 NOTE — ED Triage Notes (Addendum)
Pt state she had her regular period on the 16th  Pt states she started having bleeding again this morning  Pt states she has no energy, feeling tired, and feeling cold  Pt states her period was not supposed to start til November 12th   Pt adds that she has noticed bruising lately

## 2021-11-01 NOTE — ED Provider Notes (Signed)
[facility] EMERGENCY DEPARTMENT Provider Note   CSN: [id number] Arrival date & time: 11/01/21  2017     History  Chief Complaint  Patient presents with   Vaginal Bleeding    Kari Gallagher is a 41 y.o. female.  41 year old female presents today for evaluation of vaginal bleeding, as well as breast swelling and tenderness.  The vaginal bleeding was noted today.  The breast swelling and tenderness has been ongoing for over a month.  She states she had her scheduled menstrual cycle around mid October.  She was using the bathroom today she noticed some bleeding.  She states she stayed on the commode and noticed a blood clot as well.  She endorses fatigue, but denies lightheadedness.  Fatigue has been ongoing for quite some time.  She endorses iron deficiency anemia.  The history is provided by the patient. No language interpreter was used.       Home Medications Prior to Admission medications   Medication Sig Start Date End Date Taking? Authorizing Provider  furosemide (LASIX) 20 MG tablet Take 1 tablet (20 mg total) by mouth daily as needed for fluid or edema. Patient not taking: Reported on 03/01/2021 06/23/19   Olin Hauser, DO  levothyroxine (SYNTHROID) 125 MCG tablet Take 1 tablet (125 mcg total) by mouth daily. 03/28/21   Shamleffer, Melanie Crazier, MD  Multiple Vitamin (MULTIVITAMIN WITH MINERALS) TABS tablet Take 1 tablet by mouth daily. Patient not taking: Reported on 03/01/2021    [provider]  polyethylene glycol powder (GLYCOLAX/MIRALAX) 17 GM/SCOOP powder Take 17 g by mouth daily. 07/21/20   Virgel Manifold, MD  potassium chloride (KLOR-CON) 10 MEQ tablet Take 10 mEq by mouth daily as needed. Patient not taking: Reported on 03/01/2021 06/23/19   [provider]  traZODone (DESYREL) 100 MG tablet Take 1 tablet (100 mg total) by mouth at bedtime as needed for sleep. Start with half tablet (50mg ) for up to 1 week, then increase to 1  whole tablet Patient not taking: Reported on 03/01/2021 06/23/19   Olin Hauser, DO      Allergies    Patient has no known allergies.    Review of Systems   Review of Systems  Constitutional:  Positive for fatigue. Negative for fever.  Respiratory:  Negative for shortness of breath.   Cardiovascular:  Negative for chest pain.  Gastrointestinal:  Negative for abdominal pain and nausea.  Genitourinary:  Positive for vaginal bleeding. Negative for dysuria, pelvic pain and vaginal discharge.  Neurological:  Negative for syncope and light-headedness.  All other systems reviewed and are negative.   Physical Exam Updated Vital Signs BP (!) 130/99 (BP Location: Left Arm)   Pulse 88   Temp 98.4 F (36.9 C) (Oral)   Resp 18   Ht 5\' 6"  (1.676 m)   Wt 72.6 kg   LMP 10/12/2021 (Exact Date)   SpO2 98%   BMI 25.82 kg/m  Physical Exam Vitals and nursing note reviewed. Exam conducted with a chaperone present Shirlean Mylar EMT present as chaperone.).  Constitutional:      General: She is not in acute distress.    Appearance: Normal appearance. She is not ill-appearing.  HENT:     Head: Normocephalic and atraumatic.     Nose: Nose normal.  Eyes:     General: No scleral icterus.    Extraocular Movements: Extraocular movements intact.     Conjunctiva/sclera: Conjunctivae normal.  Cardiovascular:     Rate and Rhythm: Normal  rate and regular rhythm.     Pulses: Normal pulses.  Pulmonary:     Effort: Pulmonary effort is normal. No respiratory distress.     Breath sounds: Normal breath sounds. No wheezing or rales.  Chest:     Comments: Modular changes noted to both breasts.  No abscess or other focal lesion. Abdominal:     General: There is no distension.     Palpations: Abdomen is soft.     Tenderness: There is no abdominal tenderness. There is no guarding.  Genitourinary:    Vagina: No foreign body. Bleeding present. No vaginal discharge, erythema or lesions.     Cervix: No  cervical motion tenderness, friability, erythema or cervical bleeding.     Comments: Small amount of old blood noted within the vaginal vault.  No active bleeding noted. Musculoskeletal:        General: Normal range of motion.     Cervical back: Normal range of motion.     Right lower leg: No edema.     Left lower leg: No edema.  Skin:    General: Skin is warm and dry.  Neurological:     General: No focal deficit present.     Mental Status: She is alert. Mental status is at baseline.     ED Results / Procedures / Treatments   Labs (all labs ordered are listed, but only abnormal results are displayed) Labs Reviewed  CBC WITH DIFFERENTIAL/PLATELET - Abnormal; Notable for the following components:      Result Value   Hemoglobin 10.7 (*)    HCT 34.2 (*)    MCH 25.9 (*)    All other components within normal limits  BASIC METABOLIC PANEL  PROTIME-INR    EKG None  Radiology No results found.  Procedures Procedures    Medications Ordered in ED Medications  sodium chloride 0.9 % bolus 1,000 mL (1,000 mLs Intravenous New Bag/Given 11/01/21 2244)    ED Course/ Medical Decision Making/ A&P                           Medical Decision Making Amount and/or Complexity of Data Reviewed Labs: ordered.   Medical Decision Making / ED Course   This patient presents to the ED for concern of vaginal bleeding, breast tenderness this involves an extensive number of treatment options, and is a complaint that carries with it a high risk of complications and morbidity.  The differential diagnosis includes breast abscess, abnormal uterine bleeding, laceration, ectopic pregnancy, miscarriage  MDM: 41 year old female presents today for evaluation of above-mentioned complaints.  She is overall well-appearing.  Vital signs are stable and not indicative of acute blood loss anemia.  Small amounts of blood noted on pelvic exam.  No active bleeding noted.  Breast with generalized nodular changes.   Patient is overall stable.  Referral to women's clinic, breast clinic provided.  She has a follow-up appointment scheduled for 11/6 that she will keep.  She will return for any concerning symptoms.  We did discuss starting birth control to help slow the bleeding however since she is only having mild bleeding only had 1 episode she defers this and will return if she has heavy bleeding or if bleeding continues she will discuss with GYN or her PCP.  Patient discharged in stable condition.  Return precautions discussed.   Lab Tests: -I ordered, reviewed, and interpreted labs.   The pertinent results include:   Labs Reviewed  CBC  WITH DIFFERENTIAL/PLATELET - Abnormal; Notable for the following components:      Result Value   Hemoglobin 10.7 (*)    HCT 34.2 (*)    MCH 25.9 (*)    All other components within normal limits  BASIC METABOLIC PANEL  PROTIME-INR      EKG  EKG Interpretation  Date/Time:    Ventricular Rate:    PR Interval:    QRS Duration:   QT Interval:    QTC Calculation:   R Axis:     Text Interpretation:           Medicines ordered and prescription drug management: Meds ordered this encounter  Medications   sodium chloride 0.9 % bolus 1,000 mL    -I have reviewed the patients home medicines and have made adjustments as needed  Critical interventions Liter fluid bolus  Reevaluation: After the interventions noted above, I reevaluated the patient and found that they have :improved  Co morbidities that complicate the patient evaluation  Past Medical History:  Diagnosis Date   Frequent headaches    Hypothyroidism    Leaky heart valve       Dispostion: Patient discharged in stable condition.  Return precautions discussed.  Patient voices understanding and is in agreement with plan.  Final Clinical Impression(s) / ED Diagnoses Final diagnoses:  Vaginal bleeding  Breast pain    Rx / DC Orders ED Discharge Orders     None         Evlyn Courier,  PA-C 11/01/21 2321    Charlesetta Shanks, MD 11/24/21 [phone]

## 2021-11-06 ENCOUNTER — Ambulatory Visit (INDEPENDENT_AMBULATORY_CARE_PROVIDER_SITE_OTHER): Payer: Medicaid Other | Admitting: Family Medicine

## 2021-11-06 ENCOUNTER — Encounter: Payer: Self-pay | Admitting: Family Medicine

## 2021-11-06 VITALS — BP 124/75 | HR 81 | Ht 66.0 in | Wt 166.0 lb

## 2021-11-06 DIAGNOSIS — F3342 Major depressive disorder, recurrent, in full remission: Secondary | ICD-10-CM

## 2021-11-06 DIAGNOSIS — D500 Iron deficiency anemia secondary to blood loss (chronic): Secondary | ICD-10-CM | POA: Diagnosis not present

## 2021-11-06 DIAGNOSIS — D649 Anemia, unspecified: Secondary | ICD-10-CM

## 2021-11-06 DIAGNOSIS — G4701 Insomnia due to medical condition: Secondary | ICD-10-CM

## 2021-11-06 DIAGNOSIS — N926 Irregular menstruation, unspecified: Secondary | ICD-10-CM | POA: Diagnosis not present

## 2021-11-06 DIAGNOSIS — E890 Postprocedural hypothyroidism: Secondary | ICD-10-CM

## 2021-11-06 NOTE — Progress Notes (Unsigned)
Subjective:    Patient ID: Kari Gallagher, female    DOB: Nov 16, 1980, 41 y.o.   MRN: [id number]  Kari Gallagher is a 41 y.o. female presenting on 11/06/2021 for No chief complaint on file.   HPI  Hypothyroidism Last visit with Dr Lonzo Cloud 03/2021  Anemia, normocytic She has failed ***  Vaginal Bleeding ED Visit ***  Admits bruising identified.   Health Maintenance: ***     05/23/2020    9:38 AM 10/21/2019   11:18 AM 06/23/2019    9:06 AM  Depression screen PHQ 2/9  Decreased Interest 1 0 0  Down, Depressed, Hopeless 1 0 0  PHQ - 2 Score 2 0 0  Altered sleeping 2 0 0  Tired, decreased energy 1 0 1  Change in appetite 3 0 0  Feeling bad or failure about yourself  0 0 0  Trouble concentrating 2 0 0  Moving slowly or fidgety/restless 1 0 0  Suicidal thoughts 0 0 0  PHQ-9 Score 11 0 1  Difficult doing work/chores Very difficult Not difficult at all Not difficult at all    Social History   Tobacco Use   Smoking status: Former    Packs/day: 0.50    Types: Cigarettes    Quit date: 04/02/2018    Years since quitting: 3.6   Smokeless tobacco: Former  Building services engineer Use: Never used  Substance Use Topics   Alcohol use: No   Drug use: No    Review of Systems Per HPI unless specifically indicated above     Objective:    BP 124/75   Pulse 81   Ht 5\' 6"  (1.676 m)   Wt 166 lb (75.3 kg)   LMP 10/12/2021 (Exact Date)   SpO2 100%   BMI 26.79 kg/m   Wt Readings from Last 3 Encounters:  11/06/21 166 lb (75.3 kg)  11/01/21 160 lb (72.6 kg)  03/01/21 164 lb 9.6 oz (74.7 kg)    Physical Exam Results for orders placed or performed during the hospital encounter of 11/01/21  CBC with Differential  Result Value Ref Range   WBC 5.7 4.0 - 10.5 K/uL   RBC 4.13 3.87 - 5.11 MIL/uL   Hemoglobin 10.7 (L) 12.0 - 15.0 g/dL   HCT 13/01/23 (L) 30.1 - 60.1 %   MCV 82.8 80.0 - 100.0 fL   MCH 25.9 (L) 26.0 - 34.0 pg   MCHC 31.3 30.0 - 36.0 g/dL   RDW 09.3 23.5 - 57.3  %   Platelets 253 150 - 400 K/uL   nRBC 0.0 0.0 - 0.2 %   Neutrophils Relative % 48 %   Neutro Abs 2.7 1.7 - 7.7 K/uL   Lymphocytes Relative 42 %   Lymphs Abs 2.4 0.7 - 4.0 K/uL   Monocytes Relative 9 %   Monocytes Absolute 0.5 0.1 - 1.0 K/uL   Eosinophils Relative 1 %   Eosinophils Absolute 0.1 0.0 - 0.5 K/uL   Basophils Relative 0 %   Basophils Absolute 0.0 0.0 - 0.1 K/uL   Immature Granulocytes 0 %   Abs Immature Granulocytes 0.01 0.00 - 0.07 K/uL  Basic metabolic panel  Result Value Ref Range   Sodium 137 135 - 145 mmol/L   Potassium 3.7 3.5 - 5.1 mmol/L   Chloride 105 98 - 111 mmol/L   CO2 27 22 - 32 mmol/L   Glucose, Bld 95 70 - 99 mg/dL   BUN 7 6 - 20 mg/dL  Creatinine, Ser 0.85 0.44 - 1.00 mg/dL   Calcium 9.0 8.9 - 10.3 mg/dL   GFR, Estimated >60 >60 mL/min   Anion gap 5 5 - 15  Protime-INR  Result Value Ref Range   Prothrombin Time 12.9 11.4 - 15.2 seconds   INR 1.0 0.8 - 1.2      Assessment & Plan:   Problem List Items Addressed This Visit     Hypothyroidism - Primary   Relevant Orders   TSH   T4, free   Insomnia   Major depression in full remission ([place])   Other Visit Diagnoses     Normocytic anemia       Relevant Orders   CBC with Differential/Platelet   Iron, TIBC and Ferritin Panel   Ambulatory referral to Obstetrics / Gynecology   Anemia, blood loss       Relevant Orders   CBC with Differential/Platelet   Iron, TIBC and Ferritin Panel   Ambulatory referral to Obstetrics / Gynecology   Irregular menstrual bleeding       Relevant Orders   Ambulatory referral to Obstetrics / Gynecology       No orders of the defined types were placed in this encounter.     Follow up plan: Return if symptoms worsen or fail to improve.   Nobie Putnam, [facility] 11/06/2021, 3:45 PM

## 2021-11-06 NOTE — Patient Instructions (Addendum)
Thank you for coming to the office today.  Labs to [facility], printed today  Iron and Thyroid testing  Stay tuned  I do not see any pap smear test completed.  Referral [facility] at [place], [place], [place] [address]  [phone]   Optometrist located closer to you would be best. Vision screening  May need readers or other corrective lens  ------------------------------------------  Thyroid Lab orders   [facility]   Address: [address] [place], [place] [address] Phone: [phone]  Website: [email or web address] [address], [place], [place] [address] Phone: [phone] [email or web address]  [facility]  Address: [place], [place], [place] [address] Phone: [phone]   [facility] [address] [place], [place] [address] Phone: [phone]  [facility] Address: [place], [place], [place] [address]  Phone: [phone]   Please schedule a Follow-up Appointment to: Return if symptoms worsen or fail to improve.  If you have any other questions or concerns, please feel free to call the office or send a message through [place]. You may also schedule an earlier appointment if necessary.  Additionally, you may be receiving a survey about your experience at our office within a few days to 1 week by e-mail or mail. We value your feedback.  Nobie Putnam, DO [place]

## 2021-11-08 DIAGNOSIS — E890 Postprocedural hypothyroidism: Secondary | ICD-10-CM | POA: Diagnosis not present

## 2021-11-08 DIAGNOSIS — N912 Amenorrhea, unspecified: Secondary | ICD-10-CM | POA: Diagnosis not present

## 2021-11-08 DIAGNOSIS — N926 Irregular menstruation, unspecified: Secondary | ICD-10-CM | POA: Diagnosis not present

## 2021-11-08 DIAGNOSIS — D500 Iron deficiency anemia secondary to blood loss (chronic): Secondary | ICD-10-CM | POA: Diagnosis not present

## 2021-11-08 DIAGNOSIS — D649 Anemia, unspecified: Secondary | ICD-10-CM | POA: Diagnosis not present

## 2021-11-09 LAB — TSH: TSH: 0.691 u[IU]/mL (ref 0.450–4.500)

## 2021-11-09 LAB — CBC WITH DIFFERENTIAL/PLATELET
Basophils Absolute: 0 10*3/uL (ref 0.0–0.2)
Basos: 0 %
EOS (ABSOLUTE): 0.1 10*3/uL (ref 0.0–0.4)
Eos: 1 %
Hematocrit: 37.6 % (ref 34.0–46.6)
Hemoglobin: 11.7 g/dL (ref 11.1–15.9)
Immature Grans (Abs): 0 10*3/uL (ref 0.0–0.1)
Immature Granulocytes: 0 %
Lymphocytes Absolute: 1.7 10*3/uL (ref 0.7–3.1)
Lymphs: 31 %
MCH: 25.8 pg — ABNORMAL LOW (ref 26.6–33.0)
MCHC: 31.1 g/dL — ABNORMAL LOW (ref 31.5–35.7)
MCV: 83 fL (ref 79–97)
Monocytes Absolute: 0.4 10*3/uL (ref 0.1–0.9)
Monocytes: 7 %
Neutrophils Absolute: 3.3 10*3/uL (ref 1.4–7.0)
Neutrophils: 61 %
Platelets: 285 10*3/uL (ref 150–450)
RBC: 4.53 x10E6/uL (ref 3.77–5.28)
RDW: 14.2 % (ref 11.7–15.4)
WBC: 5.4 10*3/uL (ref 3.4–10.8)

## 2021-11-09 LAB — IRON,TIBC AND FERRITIN PANEL
Ferritin: 25 ng/mL (ref 15–150)
Iron Saturation: 8 % — CL (ref 15–55)
Iron: 30 ug/dL (ref 27–159)
Total Iron Binding Capacity: 399 ug/dL (ref 250–450)
UIBC: 369 ug/dL (ref 131–425)

## 2021-11-09 LAB — T4, FREE: Free T4: 1.66 ng/dL (ref 0.82–1.77)

## 2021-11-09 LAB — BETA HCG QUANT (REF LAB): hCG Quant: <1 m[IU]/mL

## 2021-11-21 ENCOUNTER — Encounter: Payer: Self-pay | Admitting: Certified Nurse Midwife

## 2021-11-21 ENCOUNTER — Ambulatory Visit (INDEPENDENT_AMBULATORY_CARE_PROVIDER_SITE_OTHER): Payer: Medicaid Other | Admitting: Certified Nurse Midwife

## 2021-11-21 VITALS — BP 119/79 | HR 67 | Wt 166.6 lb

## 2021-11-21 DIAGNOSIS — N644 Mastodynia: Secondary | ICD-10-CM

## 2021-11-21 DIAGNOSIS — Z1231 Encounter for screening mammogram for malignant neoplasm of breast: Secondary | ICD-10-CM

## 2021-11-21 NOTE — Progress Notes (Signed)
GYN ENCOUNTER NOTE  Subjective:       Kari Gallagher is a 41 y.o. No obstetric history on file. female is here for gynecologic evaluation of the following issues:  1. Breast pain x 2 months.  Describes it as sharp shooting pain and pain a cross the top of both breast. She denies any trama to the are, denies running/cardio exercise, she denies caffeine , and fatty diet.   Gynecologic History Patient's last menstrual period was 10/12/2021 (exact date). Contraception: tubal ligation Last Pap: it has been a while . Results were: unsure Last mammogram: has not had  Obstetric History OB History  No obstetric history on file.    Past Medical History:  Diagnosis Date   Frequent headaches    Hypothyroidism    Leaky heart valve     Past Surgical History:  Procedure Laterality Date   CHOLECYSTECTOMY     TUBAL LIGATION  2006    Current Outpatient Medications on File Prior to Visit  Medication Sig Dispense Refill   levothyroxine (EUTHYROX) 175 MCG tablet Take 1 tablet by mouth daily before breakfast.     Multiple Vitamin (MULTIVITAMIN WITH MINERALS) TABS tablet Take 1 tablet by mouth daily.     furosemide (LASIX) 20 MG tablet Take 1 tablet (20 mg total) by mouth daily as needed for fluid or edema. (Patient not taking: Reported on 11/06/2021) 30 tablet 2   levothyroxine (SYNTHROID) 125 MCG tablet Take 1 tablet (125 mcg total) by mouth daily. (Patient not taking: Reported on 11/21/2021) 90 tablet 3   potassium chloride (KLOR-CON) 10 MEQ tablet Take 10 mEq by mouth daily as needed. (Patient not taking: Reported on 11/06/2021)     traZODone (DESYREL) 100 MG tablet Take 1 tablet (100 mg total) by mouth at bedtime as needed for sleep. Start with half tablet (50mg ) for up to 1 week, then increase to 1 whole tablet (Patient not taking: Reported on 11/06/2021) 30 tablet 5   No current facility-administered medications on file prior to visit.    No Known Allergies  Social History    Socioeconomic History   Marital status: Married    Spouse name: Not on file   Number of children: 3   Years of education: Not on file   Highest education level: Not on file  Occupational History   Not on file  Tobacco Use   Smoking status: Former    Packs/day: 0.50    Types: Cigarettes    Quit date: 04/02/2018    Years since quitting: 3.6   Smokeless tobacco: Former  06/02/2018 Use: Never used  Substance and Sexual Activity   Alcohol use: No   Drug use: No   Sexual activity: Not on file  Other Topics Concern   Not on file  Social History Narrative   Not on file   Social Determinants of Health   Financial Resource Strain: Not on file  Food Insecurity: Not on file  Transportation Needs: Not on file  Physical Activity: Not on file  Stress: Not on file  Social Connections: Not on file  Intimate Partner Violence: Not on file    Family History  Problem Relation Age of [place]   Stroke Mother    Depression Mother    Mental illness Mother    Heart disease Father    Thyroid disease Father    Lung cancer Father    Thyroid disease Brother     The following portions of the patient's history  were reviewed and updated as appropriate: allergies, current medications, past family history, past medical history, past social history, past surgical history and problem list.  Review of Systems Review of Systems - Negative except as mentioned in HPI Review of Systems - General ROS: negative for - chills, fatigue, fever, hot flashes, malaise or night sweats Hematological and Lymphatic ROS: negative for - bleeding problems or swollen lymph nodes Gastrointestinal ROS: negative for - abdominal pain, blood in stools, change in bowel habits and nausea/vomiting Musculoskeletal ROS: negative for - joint pain, muscle pain or muscular weakness Genito-Urinary ROS: negative for - change in menstrual cycle, dysmenorrhea, dyspareunia, dysuria, genital discharge, genital ulcers, hematuria,  incontinence, irregular/heavy menses, nocturia or pelvic painjj  Objective:   BP 119/79   Pulse 67   Wt 166 lb 9.6 oz (75.6 kg)   LMP 10/12/2021 (Exact Date)   BMI 26.89 kg/m  CONSTITUTIONAL: Well-developed, well-nourished female in no acute distress.  HENT:  Normocephalic, atraumatic.  NECK: Normal range of motion, supple, no masses.  Normal thyroid.  SKIN: Skin is warm and dry. No rash noted. Not diaphoretic. No erythema. No pallor. NEUROLGIC: Alert and oriented to person, place, and time. PSYCHIATRIC: Normal mood and affect. Normal behavior. Normal judgment and thought content. CARDIOVASCULAR:Not Examined RESPIRATORY: Not Examined BREASTS: Breasts: breasts appear normal, no suspicious masses, no skin or nipple changes or axillary nodes. ABDOMEN: Soft, non distended; Non tender.  No Organomegaly. PELVIC: deferred  MUSCULOSKELETAL: Normal range of motion. No tenderness.  No cyanosis, clubbing, or edema.     Assessment:   Breast pain    Plan:   Discussed lifestyle changes, importance of good supportive bra, use of ibuprofen and herbal supplements. Mammogram ordered. Follow up as soon as able for annual exam/pap smear. She verbalizes and agrees to plan of care.   Doreene Burke, CNM

## 2021-11-21 NOTE — Patient Instructions (Signed)
Breast Tenderness Breast tenderness is a common problem for women of all ages, but may also occur in men. Breast tenderness has many possible causes, including hormone changes, infections, taking certain medicines, and caffeine intake. In women, the pain usually comes and goes with the menstrual cycle, but it can also be constant. Breast tenderness may range from mild discomfort to severe pain. You may have tests, such as a mammogram or an ultrasound, to check for any unusual findings. Having breast tenderness usually does not mean that you have breast cancer. Follow these instructions at home: Managing pain and discomfort  If directed, put ice on the painful area. To do this: Put ice in a plastic bag. Place a towel between your skin and the bag. Leave the ice on for 20 minutes, 2-3 times a day. If your skin turns bright red, remove the ice right away to prevent skin damage. The risk of skin damage is higher if you cannot feel pain, heat, or cold. Wear a supportive bra or chest support: During exercise. While sleeping, if your breasts are very tender. Medicines Take over-the-counter and prescription medicines only as told by your health care provider. If the cause of your pain is an infection, you may be prescribed an antibiotic medicine. If you were prescribed antibiotics, take them as told by your health care provider. Do not stop using the antibiotic even if you start to feel better. Eating and drinking Decrease the amount of caffeine in your diet. Instead, drink more water and choose caffeine-free drinks. Your health care provider may recommend that you lessen the amount of fat in your diet. You can do this by: Limiting fried foods. Cooking foods using methods such as baking, boiling, grilling, and broiling. General instructions  Keep a log of the days and times when your breasts are most tender. Ask your health care provider how to do breast exams at home. This will help you notice if  you have an unusual growth or lump. Keep all follow-up visits. Contact a health care provider if: Any part of your breast is hard, red, and hot to the touch. This may be a sign of infection. You are a woman and have a new or painful lump in your breast that remains after your menstrual period ends. You are not breastfeeding and you have fluid, especially blood or pus, coming out of your nipples. You have a fever. Your pain does not improve or it gets worse. Your pain is interfering with your daily activities. Summary Breast tenderness may range from mild discomfort to severe pain. Breast tenderness has many possible causes, including hormone changes, infections, taking certain medicines, and caffeine intake. It can be treated with ice, wearing a supportive bra or chest support, and medicines. Make changes to your diet as told by your health care provider. This information is not intended to replace advice given to you by your health care provider. Make sure you discuss any questions you have with your health care provider. Document Revised: 03/01/2021 Document Reviewed: 03/01/2021 Elsevier Patient Education  2023 Elsevier Inc.  

## 2021-12-14 DIAGNOSIS — J069 Acute upper respiratory infection, unspecified: Secondary | ICD-10-CM | POA: Diagnosis not present

## 2021-12-15 ENCOUNTER — Ambulatory Visit
Admission: RE | Admit: 2021-12-15 | Discharge: 2021-12-15 | Disposition: A | Discharge status: Home / Self Care | Payer: Medicaid Other | Source: Ambulatory Visit | Attending: Certified Nurse Midwife | Admitting: Certified Nurse Midwife

## 2021-12-15 DIAGNOSIS — Z1231 Encounter for screening mammogram for malignant neoplasm of breast: Secondary | ICD-10-CM | POA: Diagnosis not present

## 2022-02-21 ENCOUNTER — Ambulatory Visit: Payer: Medicaid Other | Admitting: Family Medicine

## 2022-03-05 ENCOUNTER — Ambulatory Visit: Payer: Medicaid Other | Admitting: Family Medicine

## 2022-03-05 ENCOUNTER — Encounter: Payer: Self-pay | Admitting: Family Medicine

## 2022-03-05 VITALS — BP 106/70 | HR 69 | Temp 98.1°F | Ht 66.0 in | Wt 163.6 lb

## 2022-03-05 DIAGNOSIS — E559 Vitamin D deficiency, unspecified: Secondary | ICD-10-CM | POA: Diagnosis not present

## 2022-03-05 DIAGNOSIS — D500 Iron deficiency anemia secondary to blood loss (chronic): Secondary | ICD-10-CM | POA: Diagnosis not present

## 2022-03-05 DIAGNOSIS — F410 Panic disorder [episodic paroxysmal anxiety] without agoraphobia: Secondary | ICD-10-CM | POA: Diagnosis not present

## 2022-03-05 DIAGNOSIS — D649 Anemia, unspecified: Secondary | ICD-10-CM | POA: Diagnosis not present

## 2022-03-05 DIAGNOSIS — F3342 Major depressive disorder, recurrent, in full remission: Secondary | ICD-10-CM

## 2022-03-05 DIAGNOSIS — F331 Major depressive disorder, recurrent, moderate: Secondary | ICD-10-CM | POA: Diagnosis not present

## 2022-03-05 DIAGNOSIS — F411 Generalized anxiety disorder: Secondary | ICD-10-CM

## 2022-03-05 DIAGNOSIS — E538 Deficiency of other specified B group vitamins: Secondary | ICD-10-CM | POA: Diagnosis not present

## 2022-03-05 DIAGNOSIS — G4701 Insomnia due to medical condition: Secondary | ICD-10-CM | POA: Diagnosis not present

## 2022-03-05 MED ORDER — BUSPIRONE HCL 5 MG PO TABS: 5.0000 mg | ORAL_TABLET | Freq: Three times a day (TID) | ORAL | 3 refills | Status: DC | PRN

## 2022-03-05 MED ORDER — ESCITALOPRAM OXALATE 10 MG PO TABS: 10.0000 mg | ORAL_TABLET | Freq: Every day | ORAL | 3 refills | Status: DC

## 2022-03-05 MED ORDER — TRAZODONE HCL 100 MG PO TABS: 100.0000 mg | ORAL_TABLET | Freq: Every evening | ORAL | 1 refills | Status: DC | PRN

## 2022-03-05 NOTE — Patient Instructions (Addendum)
Thank you for coming to the office today.  You most likely have anemia iron deficiency.  We will re check labs and Vitamin levels B12  Referral sent today to Hematology blood specialist, may need iron infusion  [place] [facility] (Hematology/Oncology) [address] [place], [place] [address] Phone: [phone]  For depression anxiety, let's restart medication  Start Escitalopram (generic Lexapro) '10mg'$  daily may take a few weeks for it to have best result.  Start Buspar '5mg'$  up to 3 times a day as needed for anxiety only.    Please schedule a Follow-up Appointment to: Return in about 6 weeks (around 04/16/2022) for 4-6 week follow depression anxiety.  If you have any other questions or concerns, please feel free to call the office or send a message through [place]. You may also schedule an earlier appointment if necessary.  Additionally, you may be receiving a survey about your experience at our office within a few days to 1 week by e-mail or mail. We value your feedback.  Nobie Putnam, DO [place]

## 2022-03-05 NOTE — Progress Notes (Signed)
Subjective:    Patient ID: Kari Gallagher, female    DOB: October 21, 1980, 42 y.o.   MRN: [id number]  Kari Gallagher is a 42 y.o. female presenting on 03/05/2022 for Anxiety (Pt complains of feeling very anxious, paranoid, and scared. 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 )   HPI  Anemia, symptomatic Last lab Hgb 11.7 3 months ago She is due for repeat, history of anemia and b12 deficiency She saw OBGYN but did not pursue any treatment for menorrhagia or likely source of her IDA anemia. She admits pica currently and fatigued tired. 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 She admits episodic panic attacks  Hypothyroidism She follows Endocrinology for this, and has been continued on Levothyroxine 113mg daily with good ersults  Saw OBGYN 11/2021 mammogram was negative, normal      03/05/2022   10:11 AM 05/23/2020    9:38 AM 10/21/2019   11:18 AM  Depression screen PHQ 2/9  Decreased Interest 2 1 0  Down, Depressed, Hopeless 2 1 0  PHQ - 2 Score 4 2 0  Altered sleeping 3 2 0  Tired, decreased energy 3 1 0  Change in appetite 3 3 0  Feeling bad or failure about yourself  1 0 0  Trouble concentrating 2 2 0  Moving slowly or fidgety/restless 2 1 0  Suicidal thoughts 0 0 0  PHQ-9 Score 18 11 0  Difficult doing work/chores Somewhat difficult Very difficult Not difficult at all       03/05/2022   10:10 AM 05/23/2020    9:39 AM 10/15/2018   10:17 AM 07/16/2018    1:29 PM  GAD 7 : Generalized Anxiety Score  Nervous, Anxious, on Edge 2 1 0 3  Control/stop worrying '2 1 1 3  '$ Worry too much - different things '2 1 1 3  '$ Trouble relaxing '2 1 1 3  '$ Restless 1 1 0 0  Easily annoyed or irritable '2 1 2 3  '$ Afraid - awful might happen 1 0 0 3  Total GAD 7 Score '12  6 5 18  '$ Anxiety Difficulty Somewhat difficult Not difficult at all  Not difficult at all     Social History   Tobacco Use   Smoking status: Every Day    Packs/day: 0.50    Types: Cigarettes   Smokeless tobacco: Former  VScientific laboratory technicianUse: Never used  Substance Use Topics   Alcohol use: No   Drug use: No    Review of Systems Per HPI unless specifically indicated above     Objective:    BP 106/70 (BP Location: Right Arm, Patient Position: Sitting, Cuff Size: Normal)   Pulse 69   Temp 98.1 F (36.7 C) (Oral)   Ht '5\' 6"'$  (1.676 m)   Wt 163 lb 9.6 oz (74.2 kg)   SpO2 100%   BMI 26.41 kg/m   Wt Readings from Last 3 Encounters:  03/05/22 163 lb 9.6 oz (74.2 kg)  11/21/21 166 lb 9.6 oz (75.6 kg)  11/06/21 166 lb (75.3 kg)    Physical Exam Vitals and nursing note reviewed.  Constitutional:      General: She is not in acute distress.    Appearance: She is well-developed. She is not diaphoretic.  Comments: Well-appearing, comfortable, cooperative  HENT:     Head: Normocephalic and atraumatic.  Eyes:     General:        Right eye: No discharge.        Left eye: No discharge.     Conjunctiva/sclera: Conjunctivae normal.  Neck:     Thyroid: No thyromegaly.  Cardiovascular:     Rate and Rhythm: Normal rate and regular rhythm.     Heart sounds: Normal heart sounds. No murmur heard. Pulmonary:     Effort: Pulmonary effort is normal. No respiratory distress.     Breath sounds: Normal breath sounds. No wheezing or rales.  Musculoskeletal:        General: Normal range of motion.     Cervical back: Normal range of motion and neck supple.  Lymphadenopathy:     Cervical: No cervical adenopathy.  Skin:    General: Skin is warm and dry.     Findings: No erythema or rash.  Neurological:     Mental Status: She is alert and oriented to person, place, and time.     Comments: Tired appearing  Psychiatric:        Behavior: Behavior normal.     Comments: Well groomed,  good eye contact, normal speech and thoughts, anxious appearance.      Results for orders placed or performed in visit on 11/06/21  TSH  Result Value Ref Range   TSH 0.691 0.450 - 4.500 uIU/mL  T4, free  Result Value Ref Range   Free T4 1.66 0.82 - 1.77 ng/dL  CBC with Differential/Platelet  Result Value Ref Range   WBC 5.4 3.4 - 10.8 x10E3/uL   RBC 4.53 3.77 - 5.28 x10E6/uL   Hemoglobin 11.7 11.1 - 15.9 g/dL   Hematocrit 37.6 34.0 - 46.6 %   MCV 83 79 - 97 fL   MCH 25.8 (L) 26.6 - 33.0 pg   MCHC 31.1 (L) 31.5 - 35.7 g/dL   RDW 14.2 11.7 - 15.4 %   Platelets 285 150 - 450 x10E3/uL   Neutrophils 61 Not Estab. %   Lymphs 31 Not Estab. %   Monocytes 7 Not Estab. %   Eos 1 Not Estab. %   Basos 0 Not Estab. %   Neutrophils Absolute 3.3 1.4 - 7.0 x10E3/uL   Lymphocytes Absolute 1.7 0.7 - 3.1 x10E3/uL   Monocytes Absolute 0.4 0.1 - 0.9 x10E3/uL   EOS (ABSOLUTE) 0.1 0.0 - 0.4 x10E3/uL   Basophils Absolute 0.0 0.0 - 0.2 x10E3/uL   Immature Granulocytes 0 Not Estab. %   Immature Grans (Abs) 0.0 0.0 - 0.1 x10E3/uL  Iron, TIBC and Ferritin Panel  Result Value Ref Range   Total Iron Binding Capacity 399 250 - 450 ug/dL   UIBC 369 131 - 425 ug/dL   Iron 30 27 - 159 ug/dL   Iron Saturation 8 (LL) 15 - 55 %   Ferritin 25 15 - 150 ng/mL  Beta HCG, Quant  Result Value Ref Range   hCG Quant <1 mIU/mL      Assessment & Plan:   Problem List Items Addressed This Visit     Insomnia   Relevant Medications   traZODone (DESYREL) 100 MG tablet   Major depressive disorder, recurrent, moderate (HCC)   Relevant Medications   escitalopram (LEXAPRO) 10 MG tablet   busPIRone (BUSPAR) 5 MG tablet   traZODone (DESYREL) 100 MG tablet   Vitamin D deficiency   Relevant Orders   VITAMIN D 25  Hydroxy (Vit-D Deficiency, Fractures)   Other Visit Diagnoses     Anemia, blood loss    -  Primary   Relevant Orders   Ambulatory referral to Hematology / Oncology   CBC with Differential/Platelet    Iron, TIBC and Ferritin Panel   Vitamin B12 nutritional deficiency       Relevant Orders   Vitamin B12   Generalized anxiety disorder with panic attacks       Relevant Medications   escitalopram (LEXAPRO) 10 MG tablet   busPIRone (BUSPAR) 5 MG tablet   traZODone (DESYREL) 100 MG tablet   Recurrent major depressive disorder, in full remission (HCC)       Relevant Medications   escitalopram (LEXAPRO) 10 MG tablet   busPIRone (BUSPAR) 5 MG tablet   traZODone (DESYREL) 100 MG tablet       IDA Anemia Symptomatic anemia w pica  Thought to have blood loss from GYN source with menorrhagia, she saw OBGYN recently but did not pursue any therapy for menstrual bleeding.  We will re check labs and Vitamin levels B12 today  Referral sent today to Hematology blood specialist, may need iron infusion  [place] at [facility] (Hematology/Oncology) [place]. [place],  [address] Phone: [phone]   For depression anxiety insomnia She was on SSRI Zoloft in past, mixed results On Trazodone for sleep.  Restart SSRI therapy  Start Escitalopram (generic Lexapro) '10mg'$  daily may take a few weeks for it to have best result.  Start Buspar '5mg'$  up to 3 times a day as needed for anxiety only.  Orders Placed This Encounter  Procedures   CBC with Differential/Platelet   Iron, TIBC and Ferritin Panel   Vitamin B12   VITAMIN D 25 Hydroxy (Vit-D Deficiency, Fractures)   Ambulatory referral to Hematology / Oncology    Referral Priority:   Routine    Referral Type:   Consultation    Referral Reason:   Specialty Services Required    Requested Specialty:   Oncology    Number of Visits Requested:   1     Meds ordered this encounter  Medications   escitalopram (LEXAPRO) 10 MG tablet    Sig: Take 1 tablet (10 mg total) by mouth daily.    Dispense:  90 tablet    Refill:  3   busPIRone (BUSPAR) 5 MG tablet    Sig: Take 1 tablet (5 mg total) by mouth 3 (three)  times daily as needed (anxiety).    Dispense:  90 tablet    Refill:  3   traZODone (DESYREL) 100 MG tablet    Sig: Take 1 tablet (100 mg total) by mouth at bedtime as needed for sleep.    Dispense:  90 tablet    Refill:  1     Follow up plan: Return if symptoms worsen or fail to improve.   Nobie Putnam, [facility] 03/05/2022, 10:17 AM

## 2022-03-06 LAB — IRON,TIBC AND FERRITIN PANEL
%SAT: 6 % (calc) — ABNORMAL LOW (ref 16–45)
Ferritin: 14 ng/mL — ABNORMAL LOW (ref 16–232)
Iron: 27 ug/dL — ABNORMAL LOW (ref 40–190)
TIBC: 446 mcg/dL (calc) (ref 250–450)

## 2022-03-06 LAB — CBC WITH DIFFERENTIAL/PLATELET
Absolute Monocytes: 389 cells/uL (ref 200–950)
Basophils Absolute: 12 cells/uL (ref 0–200)
Basophils Relative: 0.2 %
Eosinophils Absolute: 59 cells/uL (ref 15–500)
Eosinophils Relative: 1 %
HCT: 35.8 % (ref 35.0–45.0)
Hemoglobin: 11.2 g/dL — ABNORMAL LOW (ref 11.7–15.5)
Lymphs Abs: 1670 cells/uL (ref 850–3900)
MCH: 24.8 pg — ABNORMAL LOW (ref 27.0–33.0)
MCHC: 31.3 g/dL — ABNORMAL LOW (ref 32.0–36.0)
MCV: 79.4 fL — ABNORMAL LOW (ref 80.0–100.0)
MPV: 10.7 fL (ref 7.5–12.5)
Monocytes Relative: 6.6 %
Neutro Abs: 3770 cells/uL (ref 1500–7800)
Neutrophils Relative %: 63.9 %
Platelets: 313 10*3/uL (ref 140–400)
RBC: 4.51 10*6/uL (ref 3.80–5.10)
RDW: 14.9 % (ref 11.0–15.0)
Total Lymphocyte: 28.3 %
WBC: 5.9 10*3/uL (ref 3.8–10.8)

## 2022-03-06 LAB — VITAMIN B12: Vitamin B-12: 361 pg/mL (ref 200–1100)

## 2022-03-06 LAB — VITAMIN D 25 HYDROXY (VIT D DEFICIENCY, FRACTURES): Vit D, 25-Hydroxy: 21 ng/mL — ABNORMAL LOW (ref 30–100)

## 2022-03-07 ENCOUNTER — Inpatient Hospital Stay: Payer: Medicaid Other

## 2022-03-07 ENCOUNTER — Inpatient Hospital Stay: Payer: Medicaid Other | Attending: Oncology | Admitting: Oncology

## 2022-03-07 ENCOUNTER — Encounter: Payer: Self-pay | Admitting: Oncology

## 2022-03-07 VITALS — BP 112/71 | HR 70 | Temp 97.6°F | Resp 18 | Ht 65.0 in | Wt 164.0 lb

## 2022-03-07 DIAGNOSIS — D509 Iron deficiency anemia, unspecified: Secondary | ICD-10-CM | POA: Diagnosis not present

## 2022-03-07 NOTE — Progress Notes (Signed)
Hematology/Oncology Consult note [facility] [phone] [phone]  Patient Care Team: Olin Hauser, DO as PCP - General (Family Medicine) Minna Merritts, MD as Consulting Physician (Cardiology)   Name of the patient: Kari Gallagher  [id number]  Jan 06, 1980    Reason for referral-iron deficiency anemia   Referring physician-Dr. Parks Ranger  Date of visit: 03/07/22   History of presenting illness-patient is a 42 year old African-American female referred for iron deficiency anemia.  She has received IV iron back in 2020.  She is premenopausal but states that her menstrual cycles are not particularly heavy.  Denies any consistent use of NSAIDs.  Denies any history of GI bleeding or melena.  She has not had any GI evaluation so far.  No family history of colon cancer.  Reports some ongoing fatigue and has had intolerance to oral iron in the past.  ECOG PS- 0  Pain scale- 0   Review of systems- Review of Systems  Constitutional:  Positive for malaise/fatigue. Negative for chills, fever and weight loss.  HENT:  Negative for congestion, ear discharge and nosebleeds.   Eyes:  Negative for blurred vision.  Respiratory:  Negative for cough, hemoptysis, sputum production, shortness of breath and wheezing.   Cardiovascular:  Negative for chest pain, palpitations, orthopnea and claudication.  Gastrointestinal:  Negative for abdominal pain, blood in stool, constipation, diarrhea, heartburn, melena, nausea and vomiting.  Genitourinary:  Negative for dysuria, flank pain, frequency, hematuria and urgency.  Musculoskeletal:  Negative for back pain, joint pain and myalgias.  Skin:  Negative for rash.  Neurological:  Negative for dizziness, tingling, focal weakness, seizures, weakness and headaches.  Endo/Heme/Allergies:  Does not bruise/bleed easily.  Psychiatric/Behavioral:  Negative for depression and suicidal ideas. The patient does not  have insomnia.     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   Hirsutism 10/20/2019   Lymphedema of both lower extremities 06/23/2019   Graves disease 02/03/2019   Neck pain 08/11/2018   Migraine with aura and with status migrainosus, not intractable 03/26/2016   Peripheral edema 03/26/2016   Pre-diabetes 01/14/2016   Insomnia 12/07/2015   Major depressive disorder, recurrent, moderate ([place]) 10/03/2015   Hypothyroidism 08/22/2015   Vitamin D deficiency 08/22/2015     Past Medical History:  Diagnosis Date   Frequent headaches    Hypothyroidism    Leaky heart valve      Past Surgical History:  Procedure Laterality Date   CHOLECYSTECTOMY     TUBAL LIGATION  2006    Social History   Socioeconomic History   Marital status: Married    Spouse name: Not on file   Number of children: 3   Years of education: Not on file   Highest education level: Not on file  Occupational History   Not on file  Tobacco Use   Smoking status: Every Day    Packs/day: 0.50    Types: Cigarettes   Smokeless tobacco: Former  Scientific laboratory technician Use: Never used  Substance and Sexual Activity   Alcohol use: No   Drug use: No   Sexual activity: Not on file  Other Topics Concern   Not on file  Social History Narrative   Not on file   Social Determinants of Health   Financial Resource Strain: Not on file  Food Insecurity: No Food Insecurity (03/07/2022)  Hunger Vital Sign    Worried About Running Out of Food in the Last Year: Never true    Ran Out of Food in the Last Year: Never true  Transportation Needs: No Transportation Needs (03/07/2022)   PRAPARE - Hydrologist (Medical): No    Lack of Transportation (Non-Medical): No  Physical Activity: Not on file  Stress: Not on file  Social Connections: Not on file  Intimate Partner  Violence: Not At Risk (03/07/2022)   Humiliation, Afraid, Rape, and Kick questionnaire    Fear of Current or Ex-Partner: No    Emotionally Abused: No    Physically Abused: No    Sexually Abused: No     Family History  Problem Relation Age of Onset   Stroke Mother    Depression Mother    Mental illness Mother    Heart disease Father    Thyroid disease Father    Lung cancer Father    Thyroid disease Brother      Current Outpatient Medications:    busPIRone (BUSPAR) 5 MG tablet, Take 1 tablet (5 mg total) by mouth 3 (three) times daily as needed (anxiety)., Disp: 90 tablet, Rfl: 3   escitalopram (LEXAPRO) 10 MG tablet, Take 1 tablet (10 mg total) by mouth daily., Disp: 90 tablet, Rfl: 3   levothyroxine (SYNTHROID) 125 MCG tablet, Take 1 tablet (125 mcg total) by mouth daily., Disp: 90 tablet, Rfl: 3   Multiple Vitamin (MULTIVITAMIN WITH MINERALS) TABS tablet, Take 1 tablet by mouth daily., Disp: , Rfl:    traZODone (DESYREL) 100 MG tablet, Take 1 tablet (100 mg total) by mouth at bedtime as needed for sleep., Disp: 90 tablet, Rfl: 1   furosemide (LASIX) 20 MG tablet, Take 1 tablet (20 mg total) by mouth daily as needed for fluid or edema. (Patient not taking: Reported on 03/07/2022), Disp: 30 tablet, Rfl: 2   potassium chloride (KLOR-CON) 10 MEQ tablet, Take 10 mEq by mouth daily as needed. (Patient not taking: Reported on 03/07/2022), Disp: , Rfl:    Physical exam:  Vitals:   03/07/22 1057  BP: 112/71  Pulse: 70  Resp: 18  Temp: 97.6 F (36.4 C)  TempSrc: Tympanic  SpO2: 100%  Weight: 164 lb (74.4 kg)  Height: '5\' 5"'$  (1.651 m)   Physical Exam Constitutional:      General: She is not in acute distress. Cardiovascular:     Rate and Rhythm: Normal rate and regular rhythm.     Heart sounds: Normal heart sounds.  Pulmonary:     Effort: Pulmonary effort is normal.     Breath sounds: Normal breath sounds.  Abdominal:     General: Bowel sounds are normal.     Palpations: Abdomen  is soft.  Skin:    General: Skin is warm and dry.  Neurological:     Mental Status: She is alert and oriented to person, place, and time.           Latest Ref Rng & Units 11/01/2021    8:32 PM  CMP  Glucose 70 - 99 mg/dL 95   BUN 6 - 20 mg/dL 7   Creatinine 0.44 - 1.00 mg/dL 0.85   Sodium 135 - 145 mmol/L 137   Potassium 3.5 - 5.1 mmol/L 3.7   Chloride 98 - 111 mmol/L 105   CO2 22 - 32 mmol/L 27   Calcium 8.9 - 10.3 mg/dL 9.0       Latest Ref Rng &  Units 03/05/2022   10:53 AM  CBC  WBC 3.8 - 10.8 Thousand/uL 5.9   Hemoglobin 11.7 - 15.5 g/dL 11.2   Hematocrit 35.0 - 45.0 % 35.8   Platelets 140 - 400 Thousand/uL 313      Assessment and plan- Patient is a 42 y.o. female referred for iron deficiency anemia  Patient has mild microcytic anemia with a hemoglobin of 11.2 and labs suggestive of iron deficiency.  Discussed oral versus IV iron patient would like to proceed with IV iron.  She will receive 2 doses of Feraheme 510 mg IV.  Discussed risks and benefits of IV iron including all but not limited to possible risk of infusion reaction.  Patient understands and agrees to proceed as planned.  I will repeat CBC ferritin and iron studies along with B12 and folate in 3 months and see her thereafter.  Patient will also need to be referred to GI for further evaluation of iron deficiency anemia   Thank you for this kind referral and the opportunity to participate in the care of this patient   Visit Diagnosis 1. Iron deficiency anemia, unspecified iron deficiency anemia type     Dr. Randa Evens, MD, MPH [facility] [id number] 03/07/2022

## 2022-03-07 NOTE — Progress Notes (Signed)
No concerns for the provider.

## 2022-03-15 ENCOUNTER — Inpatient Hospital Stay: Payer: Medicaid Other

## 2022-03-15 VITALS — BP 110/70 | HR 70 | Temp 97.9°F | Resp 18

## 2022-03-15 DIAGNOSIS — D509 Iron deficiency anemia, unspecified: Secondary | ICD-10-CM

## 2022-03-15 MED ORDER — SODIUM CHLORIDE 0.9 % IV SOLN
Freq: Once | INTRAVENOUS | Status: AC
  Filled 2022-03-15: qty 250

## 2022-03-15 MED ORDER — SODIUM CHLORIDE 0.9 % IV SOLN
510.0000 mg | INTRAVENOUS | Status: DC
  Administered 2022-03-15: 510 mg via INTRAVENOUS
  Filled 2022-03-15: qty 510

## 2022-03-15 MED ORDER — SODIUM CHLORIDE 0.9% FLUSH
10.0000 mL | Freq: Once | INTRAVENOUS | Status: AC | PRN
  Administered 2022-03-15: 10 mL
  Filled 2022-03-15: qty 10

## 2022-03-15 NOTE — Progress Notes (Signed)
Patient tolerated Venofer infusion well, no questions/concerns voiced. Monitored 30 min post transfusion. Patient stable at discharge. VSS. AVS given.    

## 2022-03-15 NOTE — Patient Instructions (Signed)

## 2022-03-22 ENCOUNTER — Inpatient Hospital Stay: Payer: Medicaid Other

## 2022-03-22 VITALS — BP 112/73 | HR 75 | Temp 97.5°F | Resp 16

## 2022-03-22 DIAGNOSIS — D509 Iron deficiency anemia, unspecified: Secondary | ICD-10-CM | POA: Diagnosis not present

## 2022-03-22 MED ORDER — SODIUM CHLORIDE 0.9 % IV SOLN
Freq: Once | INTRAVENOUS | Status: AC
  Filled 2022-03-22: qty 250

## 2022-03-22 MED ORDER — SODIUM CHLORIDE 0.9 % IV SOLN
510.0000 mg | INTRAVENOUS | Status: DC
  Administered 2022-03-22: 510 mg via INTRAVENOUS
  Filled 2022-03-22: qty 510

## 2022-03-22 NOTE — Progress Notes (Signed)
Pt declined 30 minute post observation 

## 2022-03-26 ENCOUNTER — Telehealth: Payer: Medicaid Other | Admitting: Physician Assistant

## 2022-03-26 ENCOUNTER — Encounter: Payer: Self-pay | Admitting: Family Medicine

## 2022-03-26 DIAGNOSIS — T3695XA Adverse effect of unspecified systemic antibiotic, initial encounter: Secondary | ICD-10-CM | POA: Diagnosis not present

## 2022-03-26 DIAGNOSIS — B379 Candidiasis, unspecified: Secondary | ICD-10-CM

## 2022-03-26 DIAGNOSIS — R3989 Other symptoms and signs involving the genitourinary system: Secondary | ICD-10-CM

## 2022-03-26 MED ORDER — FLUCONAZOLE 150 MG PO TABS: 150.0000 mg | ORAL_TABLET | ORAL | 0 refills | Status: DC | PRN

## 2022-03-26 MED ORDER — CEPHALEXIN 500 MG PO CAPS: 500.0000 mg | ORAL_CAPSULE | Freq: Two times a day (BID) | ORAL | 0 refills | Status: DC

## 2022-03-26 NOTE — Progress Notes (Signed)
E-Visit for Urinary Problems  We are sorry that you are not feeling well.  Here is how we plan to help!  Based on what you shared with me it looks like you most likely have a simple urinary tract infection.  A UTI (Urinary Tract Infection) is a bacterial infection of the bladder.  Most cases of urinary tract infections are simple to treat but a key part of your care is to encourage you to drink plenty of fluids and watch your symptoms carefully.  I have prescribed Keflex 500 mg twice a day for 7 days and Fluconazole 150mg  Take one tablet once for yeast infection, may repeat in 72 hours if needed.  Your symptoms should gradually improve. Call us if the burning in your urine worsens, you develop worsening fever, back pain or pelvic pain or if your symptoms do not resolve after completing the antibiotic.  Urinary tract infections can be prevented by drinking plenty of water to keep your body hydrated.  Also be sure when you wipe, wipe from front to back and don't hold it in!  If possible, empty your bladder every 4 hours.  HOME CARE Drink plenty of fluids Compete the full course of the antibiotics even if the symptoms resolve Remember, when you need to go.go. Holding in your urine can increase the likelihood of getting a UTI! GET HELP RIGHT AWAY IF: You cannot urinate You get a high fever Worsening back pain occurs You see blood in your urine You feel sick to your stomach or throw up You feel like you are going to pass out  MAKE SURE YOU  Understand these instructions. Will watch your condition. Will get help right away if you are not doing well or get worse.   Thank you for choosing an e-visit.  Your e-visit answers were reviewed by a board certified advanced clinical practitioner to complete your personal care plan. Depending upon the condition, your plan could have included both over the counter or prescription medications.  Please review your pharmacy choice. Make sure the  pharmacy is open so you can pick up prescription now. If there is a problem, you may contact your provider through [employer] and have the prescription routed to another pharmacy.  Your safety is important to [place]. If you have drug allergies check your prescription carefully.   For the next 24 hours you can use MyChart to ask questions about today's visit, request a non-urgent call back, or ask for a work or school excuse. You will get an email in the next two days asking about your experience. I hope that your e-visit has been valuable and will speed your recovery.  I have spent 5 minutes in review of e-visit questionnaire, review and updating patient chart, medical decision making and response to patient.   Mar Daring, PA-C

## 2022-06-08 ENCOUNTER — Inpatient Hospital Stay: Payer: Medicaid Other

## 2022-06-08 ENCOUNTER — Inpatient Hospital Stay: Payer: Medicaid Other | Admitting: Oncology

## 2022-06-11 ENCOUNTER — Telehealth: Payer: Medicaid Other | Admitting: Physician Assistant

## 2022-06-11 DIAGNOSIS — B379 Candidiasis, unspecified: Secondary | ICD-10-CM | POA: Diagnosis not present

## 2022-06-11 DIAGNOSIS — T3695XA Adverse effect of unspecified systemic antibiotic, initial encounter: Secondary | ICD-10-CM | POA: Diagnosis not present

## 2022-06-11 DIAGNOSIS — N760 Acute vaginitis: Secondary | ICD-10-CM | POA: Diagnosis not present

## 2022-06-11 DIAGNOSIS — B9689 Other specified bacterial agents as the cause of diseases classified elsewhere: Secondary | ICD-10-CM

## 2022-06-11 MED ORDER — METRONIDAZOLE 500 MG PO TABS: 500.0000 mg | ORAL_TABLET | Freq: Two times a day (BID) | ORAL | 0 refills | Status: AC

## 2022-06-11 MED ORDER — FLUCONAZOLE 150 MG PO TABS: 150.0000 mg | ORAL_TABLET | ORAL | 0 refills | Status: DC | PRN

## 2022-06-11 NOTE — Progress Notes (Signed)

## 2022-06-12 ENCOUNTER — Inpatient Hospital Stay: Payer: Medicaid Other

## 2022-06-12 ENCOUNTER — Inpatient Hospital Stay: Payer: Medicaid Other | Admitting: Oncology

## 2022-06-15 ENCOUNTER — Inpatient Hospital Stay: Payer: Medicaid Other | Attending: Oncology

## 2022-06-15 ENCOUNTER — Encounter: Payer: Self-pay | Admitting: Oncology

## 2022-06-15 ENCOUNTER — Inpatient Hospital Stay (HOSPITAL_BASED_OUTPATIENT_CLINIC_OR_DEPARTMENT_OTHER): Payer: Medicaid Other | Admitting: Oncology

## 2022-06-15 VITALS — BP 121/83 | HR 80 | Temp 98.4°F | Resp 18 | Ht 65.0 in | Wt 168.4 lb

## 2022-06-15 DIAGNOSIS — D509 Iron deficiency anemia, unspecified: Secondary | ICD-10-CM | POA: Diagnosis not present

## 2022-06-15 LAB — IRON AND TIBC
Iron: 95 ug/dL (ref 28–170)
Saturation Ratios: 27 % (ref 10.4–31.8)
TIBC: 349 ug/dL (ref 250–450)
UIBC: 254 ug/dL

## 2022-06-15 LAB — CBC
HCT: 38.9 % (ref 36.0–46.0)
Hemoglobin: 12.8 g/dL (ref 12.0–15.0)
MCH: 30 pg (ref 26.0–34.0)
MCHC: 32.9 g/dL (ref 30.0–36.0)
MCV: 91.1 fL (ref 80.0–100.0)
Platelets: 204 10*3/uL (ref 150–400)
RBC: 4.27 MIL/uL (ref 3.87–5.11)
RDW: 17.6 % — ABNORMAL HIGH (ref 11.5–15.5)
WBC: 6.1 10*3/uL (ref 4.0–10.5)
nRBC: 0 % (ref 0.0–0.2)

## 2022-06-15 LAB — VITAMIN B12: Vitamin B-12: 283 pg/mL (ref 180–914)

## 2022-06-15 LAB — FERRITIN: Ferritin: 167 ng/mL (ref 11–307)

## 2022-06-15 LAB — FOLATE: Folate: 11 ng/mL (ref >5.9)

## 2022-06-15 NOTE — Progress Notes (Signed)
Hematology/Oncology Consult note [facility]  [phone] [phone]  Patient Care Team: Smitty Cords, DO as PCP - General (Family Medicine) Antonieta Iba, MD as Consulting Physician (Cardiology)   Name of the patient: Kari Gallagher  [id number]  06-30-1980   Date of visit: 06/15/22  Diagnosis-iron deficiency anemia  Chief complaint/ Reason for visit-routine follow-up of iron deficiency anemia  Heme/Onc history: patient is a 42 year old African-American female referred for iron deficiency anemia. She has received IV iron back in 2020. She is premenopausal but states that her menstrual cycles are not particularly heavy. Denies any consistent use of NSAIDs. Denies any history of GI bleeding or melena.   Interval history-menstrual cycles can be unpredictable and sometimes they are heavy.  ECOG PS- 0 Pain scale- 0   Review of systems- Review of Systems  Constitutional:  Negative for chills, fever, malaise/fatigue and weight loss.  HENT:  Negative for congestion, ear discharge and nosebleeds.   Eyes:  Negative for blurred vision.  Respiratory:  Negative for cough, hemoptysis, sputum production, shortness of breath and wheezing.   Cardiovascular:  Negative for chest pain, palpitations, orthopnea and claudication.  Gastrointestinal:  Negative for abdominal pain, blood in stool, constipation, diarrhea, heartburn, melena, nausea and vomiting.  Genitourinary:  Negative for dysuria, flank pain, frequency, hematuria and urgency.  Musculoskeletal:  Negative for back pain, joint pain and myalgias.  Skin:  Negative for rash.  Neurological:  Negative for dizziness, tingling, focal weakness, seizures, weakness and headaches.  Endo/Heme/Allergies:  Does not bruise/bleed easily.  Psychiatric/Behavioral:  Negative for depression and suicidal ideas. The patient does not have insomnia.       No Known Allergies   Past Medical  History:  Diagnosis Date   Frequent headaches    Hypothyroidism    Leaky heart valve      Past Surgical History:  Procedure Laterality Date   CHOLECYSTECTOMY     TUBAL LIGATION  2006    Social History   Socioeconomic History   Marital status: Legally Separated    Spouse name: Not on file   Number of children: 3   Years of education: Not on file   Highest education level: Not on file  Occupational History   Not on file  Tobacco Use   Smoking status: Every Day    Packs/day: .5    Types: Cigarettes   Smokeless tobacco: Former  Building services engineer Use: Never used  Substance and Sexual Activity   Alcohol use: No   Drug use: No   Sexual activity: Not on file  Other Topics Concern   Not on file  Social History Narrative   Not on file   Social Determinants of Health   Financial Resource Strain: Not on file  Food Insecurity: No Food Insecurity (03/07/2022)   Hunger Vital Sign    Worried About Running Out of Food in the Last Year: Never true    Ran Out of Food in the Last Year: Never true  Transportation Needs: No Transportation Needs (03/07/2022)   PRAPARE - Administrator, Civil Service (Medical): No    Lack of Transportation (Non-Medical): No  Physical Activity: Not on file  Stress: Not on file  Social Connections: Not on file  Intimate Partner Violence: Not At Risk (03/07/2022)   Humiliation, Afraid, Rape, and Kick questionnaire    Fear of Current or Ex-Partner: No    Emotionally Abused: No    Physically Abused: No  Sexually Abused: No    Family History  Problem Relation Age of Onset   Stroke Mother    Depression Mother    Mental illness Mother    Heart disease Father    Thyroid disease Father    Lung cancer Father    Thyroid disease Brother      Current Outpatient Medications:    busPIRone (BUSPAR) 5 MG tablet, Take 1 tablet (5 mg total) by mouth 3 (three) times daily as needed (anxiety)., Disp: 90 tablet, Rfl: 3   cephALEXin (KEFLEX) 500  MG capsule, Take 1 capsule (500 mg total) by mouth 2 (two) times daily., Disp: 14 capsule, Rfl: 0   escitalopram (LEXAPRO) 10 MG tablet, Take 1 tablet (10 mg total) by mouth daily., Disp: 90 tablet, Rfl: 3   fluconazole (DIFLUCAN) 150 MG tablet, Take 1 tablet (150 mg total) by mouth every 3 (three) days as needed., Disp: 2 tablet, Rfl: 0   levothyroxine (SYNTHROID) 125 MCG tablet, Take 1 tablet (125 mcg total) by mouth daily., Disp: 90 tablet, Rfl: 3   metroNIDAZOLE (FLAGYL) 500 MG tablet, Take 1 tablet (500 mg total) by mouth 2 (two) times daily for 7 days., Disp: 14 tablet, Rfl: 0   Multiple Vitamin (MULTIVITAMIN WITH MINERALS) TABS tablet, Take 1 tablet by mouth daily., Disp: , Rfl:    traZODone (DESYREL) 100 MG tablet, Take 1 tablet (100 mg total) by mouth at bedtime as needed for sleep., Disp: 90 tablet, Rfl: 1   furosemide (LASIX) 20 MG tablet, Take 1 tablet (20 mg total) by mouth daily as needed for fluid or edema. (Patient not taking: Reported on 03/07/2022), Disp: 30 tablet, Rfl: 2   potassium chloride (KLOR-CON) 10 MEQ tablet, Take 10 mEq by mouth daily as needed. (Patient not taking: Reported on 03/07/2022), Disp: , Rfl:   Physical exam:  Vitals:   06/15/22 1456  BP: 121/83  Pulse: 80  Resp: 18  Temp: 98.4 F (36.9 C)  TempSrc: Tympanic  SpO2: 100%  Weight: 168 lb 6.4 oz (76.4 kg)  Height: 5\' 5"  (1.651 m)   Physical Exam Cardiovascular:     Rate and Rhythm: Normal rate and regular rhythm.     Heart sounds: Normal heart sounds.  Pulmonary:     Effort: Pulmonary effort is normal.     Breath sounds: Normal breath sounds.  Skin:    General: Skin is warm and dry.  Neurological:     Mental Status: She is alert and oriented to person, place, and time.         Latest Ref Rng & Units 11/01/2021    8:32 PM  CMP  Glucose 70 - 99 mg/dL 95   BUN 6 - 20 mg/dL 7   Creatinine 8.11 - 9.14 mg/dL 7.82   Sodium 956 - 213 mmol/L 137   Potassium 3.5 - 5.1 mmol/L 3.7   Chloride 98 - 111  mmol/L 105   CO2 22 - 32 mmol/L 27   Calcium 8.9 - 10.3 mg/dL 9.0       Latest Ref Rng & Units 03/05/2022   10:53 AM  CBC  WBC 3.8 - 10.8 Thousand/uL 5.9   Hemoglobin 11.7 - 15.5 g/dL 08.6   Hematocrit 57.8 - 45.0 % 35.8   Platelets 140 - 400 Thousand/uL 313      Assessment and plan- Patient is a 42 y.o. female who is here for routine follow-up of iron deficiency anemia  Patient is not presently anemic with an H&H of  12.8/28.9.  Ferritin levels are normal at 167 and iron studies are normal as well.  She does not require any IV iron at this time.  Patient last received IV iron in March 2024.  Etiology likely secondary to menorrhagia.  I will repeat CBC ferritin and iron studies in 3 and 6 months and I will see her back in 6 months   Visit Diagnosis 1. Iron deficiency anemia, unspecified iron deficiency anemia type      Dr. Owens Shark, MD, MPH [facility] [phone] 06/15/2022 3:10 PM

## 2022-06-16 ENCOUNTER — Encounter: Payer: Self-pay | Admitting: Oncology

## 2022-06-18 ENCOUNTER — Ambulatory Visit: Payer: Medicaid Other | Admitting: Gastroenterology

## 2022-06-18 NOTE — Progress Notes (Deleted)
[name] MD, MRCP(U.K) [address]  [address]  [place], [place] [address]  Main: [phone]  Fax: [phone]   Primary Care Physician: Smitty Cords, DO  Primary Gastroenterologist:  Dr. [name]   No chief complaint on file.   HPI: Kari Gallagher is a 42 y.o. female   Summary of history :  She had previously been seen by Dr Maximino Greenland back in 2022 for constipation.  Recently seen by Dr Smith Robert in hematology on 03/07/2022 for iron deficiency anemia. Receiving iv iron. No overt bleeding and referred to [place] for endoscopic evaluation.    Rectal bleeding: *** Nose bleeds: *** Vaginal bleeding : *** Hematemesis or hemoptysis : *** Blood in urine : *** B12 283 : 06/15/2022 Ferritin 14 : 03/05/2022      Latest Ref Rng & Units 06/15/2022    2:51 PM 03/05/2022   10:53 AM 11/08/2021   10:23 AM  CBC  WBC 4.0 - 10.5 K/uL 6.1  5.9  5.4   Hemoglobin 12.0 - 15.0 g/dL 13.0  86.5  78.4   Hematocrit 36.0 - 46.0 % 38.9  35.8  37.6   Platelets 150 - 400 K/uL 204  313  285    Iron/TIBC/Ferritin/ %Sat    Component Value Date/Time   IRON 95 06/15/2022 1451   IRON 30 11/08/2021 1023   TIBC 349 06/15/2022 1451   TIBC 399 11/08/2021 1023   FERRITIN 167 06/15/2022 1451   FERRITIN 25 11/08/2021 1023   IRONPCTSAT 27 06/15/2022 1451   IRONPCTSAT 6 (L) 03/05/2022 1053     Current Outpatient Medications  Medication Sig Dispense Refill   busPIRone (BUSPAR) 5 MG tablet Take 1 tablet (5 mg total) by mouth 3 (three) times daily as needed (anxiety). 90 tablet 3   cephALEXin (KEFLEX) 500 MG capsule Take 1 capsule (500 mg total) by mouth 2 (two) times daily. 14 capsule 0   escitalopram (LEXAPRO) 10 MG tablet Take 1 tablet (10 mg total) by mouth daily. 90 tablet 3   fluconazole (DIFLUCAN) 150 MG tablet Take 1 tablet (150 mg total) by mouth every 3 (three) days as needed. 2 tablet 0   furosemide (LASIX) 20 MG tablet Take 1 tablet (20 mg total) by mouth daily as needed for fluid  or edema. (Patient not taking: Reported on 03/07/2022) 30 tablet 2   levothyroxine (SYNTHROID) 125 MCG tablet Take 1 tablet (125 mcg total) by mouth daily. 90 tablet 3   metroNIDAZOLE (FLAGYL) 500 MG tablet Take 1 tablet (500 mg total) by mouth 2 (two) times daily for 7 days. 14 tablet 0   Multiple Vitamin (MULTIVITAMIN WITH MINERALS) TABS tablet Take 1 tablet by mouth daily.     potassium chloride (KLOR-CON) 10 MEQ tablet Take 10 mEq by mouth daily as needed. (Patient not taking: Reported on 03/07/2022)     traZODone (DESYREL) 100 MG tablet Take 1 tablet (100 mg total) by mouth at bedtime as needed for sleep. 90 tablet 1   No current facility-administered medications for this visit.    Allergies as of 06/18/2022   (No Known Allergies)     ROS:  General: Negative for anorexia, weight loss, fever, chills, fatigue, weakness. ENT: Negative for hoarseness, difficulty swallowing , nasal congestion. CV: Negative for chest pain, angina, palpitations, dyspnea on exertion, peripheral edema.  Respiratory: Negative for dyspnea at rest, dyspnea on exertion, cough, sputum, wheezing.  GI: See history of present illness. GU:  Negative for dysuria, hematuria, urinary incontinence, urinary  frequency, nocturnal urination.  Endo: Negative for unusual weight change.    Physical Examination:   There were no vitals taken for this visit.  General: Well-nourished, well-developed in no acute distress.  Eyes: No icterus. Conjunctivae pink. Mouth: Oropharyngeal mucosa moist and pink , no lesions erythema or exudate. Lungs: Clear to auscultation bilaterally. Non-labored. Heart: Regular rate and rhythm, no murmurs rubs or gallops.  Abdomen: Bowel sounds are normal, nontender, nondistended, no hepatosplenomegaly or masses, no abdominal bruits or hernia , no rebound or guarding.   Extremities: No lower extremity edema. No clubbing or deformities. Neuro: Alert and oriented x 3.  Grossly intact. Skin: Warm and dry,  no jaundice.   Psych: Alert and cooperative, normal mood and affect.   Imaging Studies: No results found.  Assessment and Plan:   Kari Gallagher is a 42 y.o. y/o female here to see me for iron deficiency anemia. Also has low b12 .   Plan  EGD+colonoscopy , if negative consider capsule study of small bowel  Celiac serology , urine analysis and H pylori breath test  IV iron and b12 relacement with Hematology     Dr [name]  MD,MRCP [facility]) Follow up in 3-4 months with PA

## 2022-06-20 ENCOUNTER — Ambulatory Visit (INDEPENDENT_AMBULATORY_CARE_PROVIDER_SITE_OTHER): Payer: Medicaid Other | Admitting: Gastroenterology

## 2022-06-20 ENCOUNTER — Other Ambulatory Visit: Payer: Self-pay

## 2022-06-20 ENCOUNTER — Telehealth: Payer: Self-pay

## 2022-06-20 ENCOUNTER — Encounter: Payer: Self-pay | Admitting: Gastroenterology

## 2022-06-20 VITALS — BP 109/74 | HR 73 | Temp 98.7°F | Ht 65.0 in | Wt 168.2 lb

## 2022-06-20 DIAGNOSIS — D508 Other iron deficiency anemias: Secondary | ICD-10-CM | POA: Diagnosis not present

## 2022-06-20 MED ORDER — NA SULFATE-K SULFATE-MG SULF 17.5-3.13-1.6 GM/177ML PO SOLN: 354.0000 mL | Freq: Once | ORAL | 0 refills | Status: AC

## 2022-06-20 MED ORDER — VITAMIN B-12 1000 MCG PO TABS: 1000.0000 ug | ORAL_TABLET | Freq: Every day | ORAL | 6 refills | Status: DC

## 2022-06-20 NOTE — Telephone Encounter (Signed)
Hey pt want to change her appt for procedures due to [facility] stating her insurance will end August 01, 2022. She would like to get it all done before July 31st 2024.

## 2022-06-20 NOTE — Progress Notes (Signed)
[name] MD, MRCP(U.K) [address]  [address]  [place], [place] [address]  Main: [phone]  Fax: [phone]   Primary Care Physician: Smitty Cords, DO  Primary Gastroenterologist:  Dr. [name]   Chief Complaint  Patient presents with   IDA    HPI: Kari Gallagher is a 42 y.o. female   Summary of history :   She has previously been seen by Dr. Maximino Greenland in July 2022 for aminal pain likely due to reflux.  As well as constipation.  In March 2024 seen by Dr. Smith Robert in hematology for iron deficiency anemia with no overt blood loss and referred to see me for GI evaluation.  In March 2024 ferritin of 14 and a B12 283.  Hemoglobin of 11.2 g with an MCV of 79.4.  No abdominal imaging recently.  No endoscopic evaluation recently.  With a She did denies any nosebleeds blood in the stool change in bowel habits rectal bleeding blood in the urine.  She does have heavy.  She states for the first few days changed 7 pads a day supersize pads heavily soaked.  Denies any NSAID use.  No prior GI evaluation. Current Outpatient Medications  Medication Sig Dispense Refill   busPIRone (BUSPAR) 5 MG tablet Take 1 tablet (5 mg total) by mouth 3 (three) times daily as needed (anxiety). 90 tablet 3   cephALEXin (KEFLEX) 500 MG capsule Take 1 capsule (500 mg total) by mouth 2 (two) times daily. 14 capsule 0   escitalopram (LEXAPRO) 10 MG tablet Take 1 tablet (10 mg total) by mouth daily. 90 tablet 3   fluconazole (DIFLUCAN) 150 MG tablet Take 1 tablet (150 mg total) by mouth every 3 (three) days as needed. 2 tablet 0   furosemide (LASIX) 20 MG tablet Take 1 tablet (20 mg total) by mouth daily as needed for fluid or edema. (Patient not taking: Reported on 03/07/2022) 30 tablet 2   levothyroxine (SYNTHROID) 125 MCG tablet Take 1 tablet (125 mcg total) by mouth daily. 90 tablet 3   Multiple Vitamin (MULTIVITAMIN WITH MINERALS) TABS tablet Take 1 tablet by mouth daily.      potassium chloride (KLOR-CON) 10 MEQ tablet Take 10 mEq by mouth daily as needed. (Patient not taking: Reported on 03/07/2022)     traZODone (DESYREL) 100 MG tablet Take 1 tablet (100 mg total) by mouth at bedtime as needed for sleep. 90 tablet 1   No current facility-administered medications for this visit.    Allergies as of 06/20/2022   (No Known Allergies)     ROS:  General: Negative for anorexia, weight loss, fever, chills, fatigue, weakness. ENT: Negative for hoarseness, difficulty swallowing , nasal congestion. CV: Negative for chest pain, angina, palpitations, dyspnea on exertion, peripheral edema.  Respiratory: Negative for dyspnea at rest, dyspnea on exertion, cough, sputum, wheezing.  GI: See history of present illness. GU:  Negative for dysuria, hematuria, urinary incontinence, urinary frequency, nocturnal urination.  Endo: Negative for unusual weight change.    Physical Examination:   There were no vitals taken for this visit.  General: Well-nourished, well-developed in no acute distress.  Eyes: No icterus. Conjunctivae pink. Mouth: Oropharyngeal mucosa moist and pink , no lesions erythema or exudate. Neuro: Alert and oriented x 3.  Grossly intact. Skin: Warm and dry, no jaundice.   Psych: Alert and cooperative, normal mood and affect.   Imaging Studies: No results found.  Assessment and Plan:   Kari Gallagher is a  42 y.o. y/o female history of combined B12 and iron deficiency anemia referred to me for GI evaluation.  Follows with hematology for IV iron.  No prior GI evaluation.  No prior history of overt blood loss, history suggestive of menorrhagia  Plan 1.  EGD and colonoscopy.  Very likely blood loss is due to menorrhagia 2.  Celiac serology, urine analysis, H. pylori breath test. 3.  Receiving IV iron with hematology 4.  She states she is not on B12 supplementation we will commence her on B12 1000 mcg/day   I have discussed alternative options, risks &  benefits,  which include, but are not limited to, bleeding, infection, perforation,respiratory complication & drug reaction.  The patient agrees with this plan & written consent will be obtained.     Dr [name]  MD,MRCP [facility]) Follow up in 3 months

## 2022-06-20 NOTE — Telephone Encounter (Signed)
Called patient back and had to leave her a voicemail letting her know that we only had July 25th with another doctor (Dr. Allegra Lai) but in our [place] location before 08/01/2022. I told her to call me back.

## 2022-06-21 NOTE — Telephone Encounter (Signed)
Per pt she will take July 25th.   I will do the P.A. for it today.

## 2022-06-22 NOTE — Telephone Encounter (Signed)
Patient's procedure was changed to 07/26/2022 due to her insurance running out and Dr. Tobi Bastos didn't have anything open.

## 2022-07-18 ENCOUNTER — Encounter: Payer: Self-pay | Admitting: Gastroenterology

## 2022-07-19 ENCOUNTER — Encounter: Payer: Self-pay | Admitting: Gastroenterology

## 2022-07-19 NOTE — Anesthesia Preprocedure Evaluation (Signed)
Anesthesia Evaluation  Patient identified by MRN, date of birth, ID band Patient awake    Reviewed: Allergy & Precautions, NPO status , Patient's Chart, lab work & pertinent test results  Airway Mallampati: III  TM Distance: >3 FB Neck ROM: full    Dental  (+) Chipped, Dental Advidsory Given   Pulmonary neg pulmonary ROS, Current Smoker and Patient abstained from smoking.   Pulmonary exam normal        Cardiovascular negative cardio ROS Normal cardiovascular exam  Echo 05-01-13 EF 60-65%, trivial TR, trivial MR   Neuro/Psych  PSYCHIATRIC DISORDERS      negative neurological ROS     GI/Hepatic negative GI ROS, Neg liver ROS,,,  Endo/Other  Hypothyroidism    Renal/GU negative Renal ROS  negative genitourinary   Musculoskeletal   Abdominal   Peds  Hematology negative hematology ROS (+)   Anesthesia Other Findings Hypothyroidism  Frequent headaches Leaky heart valve  Iron deficiency anemia Hx blood loss anemia   Reproductive/Obstetrics negative OB ROS                             Anesthesia Physical Anesthesia Plan  ASA: 2  Anesthesia Plan: General   Post-op Pain Management: Minimal or no pain anticipated   Induction: Intravenous  PONV Risk Score and Plan: 3 and Propofol infusion, TIVA and Ondansetron  Airway Management Planned: Nasal Cannula  Additional Equipment: None  Intra-op Plan:   Post-operative Plan:   Informed Consent: I have reviewed the patients History and Physical, chart, labs and discussed the procedure including the risks, benefits and alternatives for the proposed anesthesia with the patient or authorized representative who has indicated his/her understanding and acceptance.     Dental advisory given  Plan Discussed with: CRNA and Surgeon  Anesthesia Plan Comments: (Discussed risks of anesthesia with patient, including possibility of difficulty with  spontaneous ventilation under anesthesia necessitating airway intervention, PONV, and rare risks such as cardiac or respiratory or neurological events, and allergic reactions. Discussed the role of CRNA in patient's perioperative care. Patient understands.)       Anesthesia Quick Evaluation

## 2022-07-24 DIAGNOSIS — D508 Other iron deficiency anemias: Secondary | ICD-10-CM | POA: Diagnosis not present

## 2022-07-25 LAB — CELIAC DISEASE AB SCREEN W/RFX
Antigliadin Abs, IgA: 3 units (ref 0–19)
IgA/Immunoglobulin A, Serum: 322 mg/dL (ref 87–352)

## 2022-07-25 LAB — H. PYLORI BREATH TEST

## 2022-07-25 LAB — URINALYSIS
Bilirubin, UA: NEGATIVE
Nitrite, UA: NEGATIVE
Urobilinogen, Ur: 1 mg/dL (ref 0.2–1.0)

## 2022-07-26 ENCOUNTER — Ambulatory Visit: Payer: Medicaid Other | Admitting: Anesthesiology

## 2022-07-26 ENCOUNTER — Encounter
Admission: RE | Disposition: A | Discharge status: Home / Self Care | Payer: Self-pay | Source: Home / Self Care | Attending: Gastroenterology

## 2022-07-26 ENCOUNTER — Other Ambulatory Visit: Payer: Self-pay

## 2022-07-26 ENCOUNTER — Encounter: Payer: Self-pay | Admitting: Gastroenterology

## 2022-07-26 ENCOUNTER — Ambulatory Visit
Admission: RE | Admit: 2022-07-26 | Discharge: 2022-07-26 | Disposition: A | Discharge status: Home / Self Care | Payer: Medicaid Other | Attending: Gastroenterology | Admitting: Gastroenterology

## 2022-07-26 DIAGNOSIS — D509 Iron deficiency anemia, unspecified: Secondary | ICD-10-CM | POA: Diagnosis not present

## 2022-07-26 DIAGNOSIS — F17210 Nicotine dependence, cigarettes, uncomplicated: Secondary | ICD-10-CM | POA: Insufficient documentation

## 2022-07-26 DIAGNOSIS — Z79890 Hormone replacement therapy (postmenopausal): Secondary | ICD-10-CM | POA: Diagnosis not present

## 2022-07-26 DIAGNOSIS — Z79899 Other long term (current) drug therapy: Secondary | ICD-10-CM | POA: Diagnosis not present

## 2022-07-26 DIAGNOSIS — D508 Other iron deficiency anemias: Secondary | ICD-10-CM | POA: Diagnosis not present

## 2022-07-26 DIAGNOSIS — E039 Hypothyroidism, unspecified: Secondary | ICD-10-CM | POA: Insufficient documentation

## 2022-07-26 HISTORY — DX: Iron deficiency anemia, unspecified: D50.9

## 2022-07-26 HISTORY — PX: ESOPHAGOGASTRODUODENOSCOPY (EGD) WITH PROPOFOL: SHX5813

## 2022-07-26 HISTORY — PX: COLONOSCOPY WITH PROPOFOL: SHX5780

## 2022-07-26 LAB — URINALYSIS
Glucose, UA: NEGATIVE
Ketones, UA: NEGATIVE
Specific Gravity, UA: 1.022 (ref 1.005–1.030)
pH, UA: 7 (ref 5.0–7.5)

## 2022-07-26 SURGERY — COLONOSCOPY WITH PROPOFOL
Anesthesia: General | Site: Throat

## 2022-07-26 MED ORDER — STERILE WATER FOR IRRIGATION IR SOLN
Status: DC | PRN
  Administered 2022-07-26: 50 mL

## 2022-07-26 MED ORDER — LACTATED RINGERS IV SOLN: INTRAVENOUS | Status: DC

## 2022-07-26 MED ORDER — LIDOCAINE HCL (CARDIAC) PF 100 MG/5ML IV SOSY
PREFILLED_SYRINGE | INTRAVENOUS | Status: DC | PRN
  Administered 2022-07-26: 100 mg via INTRAVENOUS

## 2022-07-26 MED ORDER — SODIUM CHLORIDE 0.9 % IV SOLN: INTRAVENOUS | Status: DC

## 2022-07-26 MED ORDER — PROPOFOL 10 MG/ML IV BOLUS
INTRAVENOUS | Status: DC | PRN
Start: 2022-07-26 — End: 2022-07-26
  Administered 2022-07-26: 100 mg via INTRAVENOUS

## 2022-07-26 MED ORDER — PROPOFOL 500 MG/50ML IV EMUL
INTRAVENOUS | Status: DC | PRN
  Administered 2022-07-26: 180 ug/kg/min via INTRAVENOUS

## 2022-07-26 SURGICAL SUPPLY — 8 items
BLOCK BITE 60FR ADLT L/F GRN (MISCELLANEOUS) ×2 IMPLANT
FORCEPS BIOP RAD 4 LRG CAP 4 (CUTTING FORCEPS) IMPLANT
GOWN CVR UNV OPN BCK APRN NK (MISCELLANEOUS) ×4 IMPLANT
GOWN ISOL THUMB LOOP REG UNIV (MISCELLANEOUS) ×4
KIT PRC NS LF DISP ENDO (KITS) ×2 IMPLANT
KIT PROCEDURE OLYMPUS (KITS) ×4
MANIFOLD NEPTUNE II (INSTRUMENTS) ×2 IMPLANT
WATER STERILE IRR 250ML POUR (IV SOLUTION) ×2 IMPLANT

## 2022-07-26 NOTE — Op Note (Signed)
[facility] Gastroenterology Patient Name: Kari Gallagher Procedure Date: 07/26/2022 10:23 AM MRN: [id number] Account #: [id number] Date of Birth: January 12, 1980 Admit Type: Outpatient Age: 42 Room: [facility] OR ROOM 01 Gender: Female Note Status: Finalized Instrument Name: [id number] Procedure:             Colonoscopy Indications:           This is the patient's first colonoscopy, Unexplained                         iron deficiency anemia Providers:             Toney Reil MD, MD Referring MD:          Smitty Cords (Referring MD) Medicines:             General Anesthesia Complications:         No immediate complications. Estimated blood loss: None. Procedure:             Pre-Anesthesia Assessment:                        - Prior to the procedure, a History and Physical was                         performed, and patient medications and allergies were                         reviewed. The patient is competent. The risks and                         benefits of the procedure and the sedation options and                         risks were discussed with the patient. All questions                         were answered and informed consent was obtained.                         Patient identification and proposed procedure were                         verified by the physician, the nurse, the                         anesthesiologist, the anesthetist and the technician                         in the pre-procedure area in the procedure room in the                         endoscopy suite. Mental Status Examination: alert and                         oriented. Airway Examination: normal oropharyngeal                         airway and neck mobility. Respiratory Examination:  clear to auscultation. CV Examination: normal.                         Prophylactic Antibiotics: The patient does not require                         prophylactic antibiotics.  Prior Anticoagulants: The                         patient has taken no anticoagulant or antiplatelet                         agents. ASA Grade Assessment: II - A patient with mild                         systemic disease. After reviewing the risks and                         benefits, the patient was deemed in satisfactory                         condition to undergo the procedure. The anesthesia                         plan was to use general anesthesia. Immediately prior                         to administration of medications, the patient was                         re-assessed for adequacy to receive sedatives. The                         heart rate, respiratory rate, oxygen saturations,                         blood pressure, adequacy of pulmonary ventilation, and                         response to care were monitored throughout the                         procedure. The physical status of the patient was                         re-assessed after the procedure.                        After obtaining informed consent, the colonoscope was                         passed under direct vision. Throughout the procedure,                         the patient's blood pressure, pulse, and oxygen                         saturations were monitored continuously. The was  introduced through the anus and advanced to the the                         terminal ileum, with identification of the appendiceal                         orifice and IC valve. The colonoscopy was performed                         with ease. The colonoscopy was performed without                         difficulty. The patient tolerated the procedure well.                         The quality of the bowel preparation was evaluated                         using the BBPS [facility] Bowel Preparation Scale) with                         scores of: Right Colon = 3, Transverse Colon = 3 and                         Left  Colon = 3 (entire mucosa seen well with no                         residual staining, small fragments of stool or opaque                         liquid). The total BBPS score equals 9. The terminal                         ileum, ileocecal valve, appendiceal orifice, and                         rectum were photographed. Findings:      The perianal and digital rectal examinations were normal. Pertinent       negatives include normal sphincter tone and no palpable rectal lesions.      The terminal ileum appeared normal.      The entire examined colon appeared normal.      The retroflexed view of the distal rectum and anal verge was normal and       showed no anal or rectal abnormalities. Impression:            - The examined portion of the ileum was normal.                        - The entire examined colon is normal.                        - The distal rectum and anal verge are normal on                         retroflexion view.                        -  No specimens collected. Recommendation:        - Discharge patient to home (with escort).                        - Resume previous diet today.                        - Continue present medications.                        - Repeat colonoscopy in 10 years for screening                         purposes. Procedure Code(s):     --- Professional ---                        [id number], Colonoscopy, flexible; diagnostic, including                         collection of specimen(s) by brushing or washing, when                         performed (separate procedure) Diagnosis Code(s):     --- Professional ---                        D50.9, Iron deficiency anemia, unspecified CPT copyright 2022 American Medical Association. All rights reserved. The codes documented in this report are preliminary and upon coder review may  be revised to meet current compliance requirements. Dr. Libby Maw Toney Reil MD, MD 07/26/2022 11:05:28 AM This report has  been signed electronically. Number of Addenda: 0 Note Initiated On: 07/26/2022 10:23 AM Scope Withdrawal Time: 0 hours 6 minutes 49 seconds  Total Procedure Duration: 0 hours 9 minutes 44 seconds  Estimated Blood Loss:  Estimated blood loss: none.      [facility]

## 2022-07-26 NOTE — H&P (Signed)
Kari Repress, MD [address]  [address]  [place], [place] [address]  Main: [phone]  Fax: [phone] Pager: [phone]  Primary Care Physician:  Smitty Cords, DO Primary Gastroenterologist:  Dr. Arlyss Gallagher  Pre-Procedure History & Physical: HPI:  Kari Gallagher is a 42 y.o. female is here for an endoscopy and colonoscopy.   Past Medical History:  Diagnosis Date   Frequent headaches    Hypothyroidism    Iron deficiency anemia    Leaky heart valve     Past Surgical History:  Procedure Laterality Date   CHOLECYSTECTOMY     TUBAL LIGATION  2006    Prior to Admission medications   Medication Sig Start Date End Date Taking? Authorizing Provider  cyanocobalamin (VITAMIN B12) 1000 MCG tablet Take 1 tablet (1,000 mcg total) by mouth daily. 06/20/22  Yes [name], MD  levothyroxine (SYNTHROID) 125 MCG tablet Take 1 tablet (125 mcg total) by mouth daily. 03/28/21  Yes Shamleffer, Konrad Dolores, MD  Multiple Vitamin (MULTIVITAMIN WITH MINERALS) TABS tablet Take 1 tablet by mouth daily.   Yes [provider]  traZODone (DESYREL) 100 MG tablet Take 1 tablet (100 mg total) by mouth at bedtime as needed for sleep. 03/05/22  Yes Smitty Cords, DO    Allergies as of 06/20/2022   (No Known Allergies)    Family History  Problem Relation Age of Onset   Stroke Mother    Depression Mother    Mental illness Mother    Heart disease Father    Thyroid disease Father    Lung cancer Father    Thyroid disease Brother     Social History   Socioeconomic History   Marital status: Legally Separated    Spouse name: Not on file   Number of children: 3   Years of education: Not on file   Highest education level: Not on file  Occupational History   Not on file  Tobacco Use   Smoking status: Every Day    Current packs/day: 0.50    Types: Cigarettes   Smokeless tobacco: Former  Building services engineer status: Never Used  Substance and  Sexual Activity   Alcohol use: No   Drug use: No   Sexual activity: Not on file  Other Topics Concern   Not on file  Social History Narrative   Not on file   Social Determinants of Health   Financial Resource Strain: Not on file  Food Insecurity: No Food Insecurity (03/07/2022)   Hunger Vital Sign    Worried About Running Out of Food in the Last Year: Never true    Ran Out of Food in the Last Year: Never true  Transportation Needs: No Transportation Needs (03/07/2022)   PRAPARE - Administrator, Civil Service (Medical): No    Lack of Transportation (Non-Medical): No  Physical Activity: Not on file  Stress: Not on file  Social Connections: Not on file  Intimate Partner Violence: Not At Risk (03/07/2022)   Humiliation, Afraid, Rape, and Kick questionnaire    Fear of Current or Ex-Partner: No    Emotionally Abused: No    Physically Abused: No    Sexually Abused: No    Review of Systems: See HPI, otherwise negative ROS  Physical Exam: BP 112/67   Temp 98.1 F (36.7 C) (Temporal)   Resp 15   Ht 5\' 5"  (1.651 m)   Wt 75.3 kg   SpO2 100%   BMI  27.62 kg/m  General:   Alert,  pleasant and cooperative in NAD Head:  Normocephalic and atraumatic. Neck:  Supple; no masses or thyromegaly. Lungs:  Clear throughout to auscultation.    Heart:  Regular rate and rhythm. Abdomen:  Soft, nontender and nondistended. Normal bowel sounds, without guarding, and without rebound.   Neurologic:  Alert and  oriented x4;  grossly normal neurologically.  Impression/Plan: Kari Gallagher is here for an endoscopy and colonoscopy to be performed for IDA  Risks, benefits, limitations, and alternatives regarding  endoscopy and colonoscopy have been reviewed with the patient.  Questions have been answered.  All parties agreeable.   Lannette Donath, MD  07/26/2022, 9:50 AM

## 2022-07-26 NOTE — Op Note (Signed)
[facility] Gastroenterology Patient Name: Kari Gallagher Procedure Date: 07/26/2022 10:24 AM MRN: [id number] Account #: [id number] Date of Birth: 1980/08/27 Admit Type: Outpatient Age: 42 Room: [facility] OR ROOM 01 Gender: Female Note Status: Finalized Instrument Name: [id number] Procedure:             Upper GI endoscopy Indications:           Unexplained iron deficiency anemia Providers:             Toney Reil MD, MD Referring MD:          Smitty Cords (Referring MD) Medicines:             General Anesthesia Complications:         No immediate complications. Estimated blood loss: None. Procedure:             Pre-Anesthesia Assessment:                        - Prior to the procedure, a History and Physical was                         performed, and patient medications and allergies were                         reviewed. The patient is competent. The risks and                         benefits of the procedure and the sedation options and                         risks were discussed with the patient. All questions                         were answered and informed consent was obtained.                         Patient identification and proposed procedure were                         verified by the physician, the nurse, the                         anesthesiologist, the anesthetist and the technician                         in the pre-procedure area in the procedure room in the                         endoscopy suite. Mental Status Examination: alert and                         oriented. Airway Examination: normal oropharyngeal                         airway and neck mobility. Respiratory Examination:                         clear to auscultation. CV Examination: normal.  Prophylactic Antibiotics: The patient does not require                         prophylactic antibiotics. Prior Anticoagulants: The                         patient  has taken no anticoagulant or antiplatelet                         agents. ASA Grade Assessment: II - A patient with mild                         systemic disease. After reviewing the risks and                         benefits, the patient was deemed in satisfactory                         condition to undergo the procedure. The anesthesia                         plan was to use general anesthesia. Immediately prior                         to administration of medications, the patient was                         re-assessed for adequacy to receive sedatives. The                         heart rate, respiratory rate, oxygen saturations,                         blood pressure, adequacy of pulmonary ventilation, and                         response to care were monitored throughout the                         procedure. The physical status of the patient was                         re-assessed after the procedure.                        After obtaining informed consent, the endoscope was                         passed under direct vision. Throughout the procedure,                         the patient's blood pressure, pulse, and oxygen                         saturations were monitored continuously. The Endoscope                         was introduced through the mouth, and advanced to the  second part of duodenum. The upper GI endoscopy was                         accomplished without difficulty. The patient tolerated                         the procedure well. Findings:      The esophagus, gastroesophageal junction and examined esophagus were       normal.      The stomach was normal. Biopsies were taken with a cold forceps for       histology.      The examined duodenum was normal.      The cardia and gastric fundus were normal on retroflexion. Impression:            - Normal esophagus and gastroesophageal junction.                        - Normal stomach.  Biopsied.                        - Normal examined duodenum. Recommendation:        - Await pathology results.                        - Proceed with colonoscopy as scheduled                        See colonoscopy report Procedure Code(s):     --- Professional ---                        [phone], Esophagogastroduodenoscopy, flexible,                         transoral; with biopsy, single or multiple Diagnosis Code(s):     --- Professional ---                        D50.9, Iron deficiency anemia, unspecified CPT copyright 2022 American Medical Association. All rights reserved. The codes documented in this report are preliminary and upon coder review may  be revised to meet current compliance requirements. Dr. Libby Maw Toney Reil MD, MD 07/26/2022 10:52:03 AM This report has been signed electronically. Number of Addenda: 0 Note Initiated On: 07/26/2022 10:24 AM Total Procedure Duration: 0 hours 3 minutes 50 seconds  Estimated Blood Loss:  Estimated blood loss: none.      [facility]

## 2022-07-26 NOTE — Anesthesia Postprocedure Evaluation (Signed)
Anesthesia Post Note  Patient: Kari Gallagher  Procedure(s) Performed: COLONOSCOPY WITH PROPOFOL (Rectum) ESOPHAGOGASTRODUODENOSCOPY (EGD) WITH PROPOFOL (Throat)  Patient location during evaluation: PACU Anesthesia Type: General Level of consciousness: awake and alert Pain management: pain level controlled Vital Signs Assessment: post-procedure vital signs reviewed and stable Respiratory status: spontaneous breathing, nonlabored ventilation, respiratory function stable and patient connected to nasal cannula oxygen Cardiovascular status: blood pressure returned to baseline and stable Postop Assessment: no apparent nausea or vomiting Anesthetic complications: no  No notable events documented.   Last Vitals:  Vitals:   07/26/22 1117 07/26/22 1121  BP: (!) 99/54 115/70  Pulse: (!) 58 (!) 50  Resp: 13 12  Temp: (!) 36.1 C   SpO2: 100%     Last Pain:  Vitals:   07/26/22 1117  TempSrc:   PainSc: 0-No pain                 Stephanie Coup

## 2022-07-26 NOTE — Transfer of Care (Signed)
Immediate Anesthesia Transfer of Care Note  Patient: Kari Gallagher  Procedure(s) Performed: COLONOSCOPY WITH PROPOFOL (Rectum) ESOPHAGOGASTRODUODENOSCOPY (EGD) WITH PROPOFOL (Throat)  Patient Location: PACU  Anesthesia Type:General  Level of Consciousness: awake  Airway & Oxygen Therapy: Patient Spontanous Breathing  Post-op Assessment: Report given to RN and Post -op Vital signs reviewed and stable  Post vital signs: Reviewed and stable See PACU flow sheet for temp.  Last Vitals:  Vitals Value Taken Time  BP 99/54 07/26/22 1108  Temp    Pulse 64 07/26/22 1109  Resp 20 07/26/22 1109  SpO2 100 % 07/26/22 1109  Vitals shown include unfiled device data.  Last Pain:  Vitals:   07/26/22 0938  TempSrc: Temporal  PainSc: 0-No pain         Complications: No notable events documented.

## 2022-07-27 ENCOUNTER — Encounter: Payer: Self-pay | Admitting: Gastroenterology

## 2022-07-30 ENCOUNTER — Telehealth: Payer: Self-pay

## 2022-07-30 DIAGNOSIS — R319 Hematuria, unspecified: Secondary | ICD-10-CM

## 2022-07-30 NOTE — Telephone Encounter (Signed)
Called patient to let her know that celiac serology was negative. I also let her know that her urine showed red in color and with rbcs. But, I did ask her if she was on her cycle and she stated that she had just started her cycle. So then I told her that the rbcs were probably because of her menstrual cycle but that I would mention it to Dr. Tobi Bastos and Dr. Althea Charon.

## 2022-07-30 NOTE — Telephone Encounter (Signed)
Would recommend repeat Urine test when off her cycle

## 2022-07-30 NOTE — Telephone Encounter (Signed)
-----   Message from [name] sent at 07/30/2022  8:38 AM EDT ----- Kandis Cocking inform celiac serology was negative for evaluation of iron def anemia  , urine was red in color per report and had rbcs, I will Cc to Dr Althea Charon, Netta Neat, DO  to follow up on , if there is persistent blood in urine , unless she was on  her period then may warrant further evaluation .   Dr [name] MD,MRCP [facility]) Gastroenterology/Hepatology Pager: [phone]

## 2022-07-30 NOTE — Progress Notes (Signed)
Kari Gallagher inform celiac serology was negative for evaluation of iron def anemia  , urine was red in color per report and had rbcs, I will Cc to Dr Althea Charon, Netta Neat, DO  to follow up on , if there is persistent blood in urine , unless she was on her period then may warrant further evaluation .   Dr [name] MD,MRCP [facility]) Gastroenterology/Hepatology Pager: [phone]

## 2022-07-30 NOTE — Telephone Encounter (Signed)
Thank you for coordinating. I will place a Urinalysis order for her to come by our office and collect anytime she is ready when off of period.  Forward chart to our Clinical Pool, can you contact patient to ask her to come by for this urine test, if you can schedule her that would work or can do walk in. Thank you  [name], DO [facility] 07/30/2022, 5:11 PM

## 2022-07-30 NOTE — Telephone Encounter (Signed)
Dr. Althea Charon, would you want for [place] to repeat her urine test or would you do it? Please advise.

## 2022-07-31 NOTE — Telephone Encounter (Signed)
Thank you Dr. Parks Ranger.

## 2022-08-01 NOTE — Progress Notes (Signed)
Thanks will discuss with her at follow up visit that is scheduled

## 2022-08-14 ENCOUNTER — Encounter: Payer: Self-pay | Admitting: Oncology

## 2022-09-17 ENCOUNTER — Encounter: Payer: Self-pay | Admitting: Gastroenterology

## 2022-09-17 ENCOUNTER — Telehealth (INDEPENDENT_AMBULATORY_CARE_PROVIDER_SITE_OTHER): Payer: Medicaid Other | Admitting: Gastroenterology

## 2022-09-17 DIAGNOSIS — D508 Other iron deficiency anemias: Secondary | ICD-10-CM | POA: Diagnosis not present

## 2022-09-17 DIAGNOSIS — D519 Vitamin B12 deficiency anemia, unspecified: Secondary | ICD-10-CM

## 2022-09-17 DIAGNOSIS — K31A0 Gastric intestinal metaplasia, unspecified: Secondary | ICD-10-CM

## 2022-09-17 NOTE — Progress Notes (Signed)
[name] , MD [address]  [address]  [place], [place] [address]  Main: [phone]  Fax: [phone]   Primary Care Physician: Smitty Cords, DO  Virtual Visit via Video Note  I connected with patient on 09/17/22 at  2:00 PM EDT by video and verified that I am speaking with the correct person using two identifiers.   I discussed the limitations, risks, security and privacy concerns of performing an evaluation and management service by video  and the availability of in person appointments. I also discussed with the patient that there may be a patient responsible charge related to this service. The patient expressed understanding and agreed to proceed.  Location of Patient: Home Location of Provider: Home Persons involved: Patient and provider only   History of Present Illness: Chief Complaint  Patient presents with   Follow up IDA    HPI: Kari Gallagher is a 42 y.o. female   Summary of history :     She has previously been seen by Dr. Maximino Greenland in July 2022 for aminal pain likely due to reflux.  As well as constipation.   In March 2024 seen by Dr. Smith Robert in hematology for iron deficiency anemia with no overt blood loss and referred to see me for GI evaluation.   In March 2024 ferritin of 14 and a B12 283.  Hemoglobin of 11.2 g with an MCV of 79.4.   She did denies any nosebleeds blood in the stool change in bowel habits rectal bleeding blood in the urine.  She does have heavy periods .  She states for the first few days changed 7 pads a day supersize pads heavily soaked.  Denies any NSAID use.    Interval history   06/20/2022-  09/17/2022  07/24/2022: Celiac serology , H pylori negative- blood in urine seen was on period advised to recheck .  07/26/2022: EGD: Normal, colonoscopy: Normal.  Biopsies of stomach showed focal intestinal metaplasia.  No NSAID's. Has an appointment on Friday with the cancer center.She has had some b12 tablets.   Current  Outpatient Medications  Medication Sig Dispense Refill   cyanocobalamin (VITAMIN B12) 1000 MCG tablet Take 1 tablet (1,000 mcg total) by mouth daily. 30 tablet 6   levothyroxine (SYNTHROID) 125 MCG tablet Take 1 tablet (125 mcg total) by mouth daily. 90 tablet 3   Multiple Vitamin (MULTIVITAMIN WITH MINERALS) TABS tablet Take 1 tablet by mouth daily.     traZODone (DESYREL) 100 MG tablet Take 1 tablet (100 mg total) by mouth at bedtime as needed for sleep. 90 tablet 1   No current facility-administered medications for this visit.    Allergies as of 09/17/2022   (No Known Allergies)    Review of Systems:    All systems reviewed and negative except where noted in HPI.  General Appearance:    Alert, cooperative, no distress, appears stated age  Head:    Normocephalic, without obvious abnormality, atraumatic  Eyes:    PERRL, conjunctiva/corneas clear,  Ears:    Grossly normal hearing    Neurologic:  Grossly normal    Observations/Objective:  Labs: CMP     Component Value Date/Time   NA 137 11/01/2021 2032   NA 138 02/03/2018 1043   NA 137 01/20/2014 1649   K 3.7 11/01/2021 2032   K 4.1 01/20/2014 1649   CL 105 11/01/2021 2032   CL 104 01/20/2014 1649   CO2 27 11/01/2021 2032   CO2 26 01/20/2014  1649   GLUCOSE 95 11/01/2021 2032   GLUCOSE 141 (H) 01/20/2014 1649   BUN 7 11/01/2021 2032   BUN 9 02/03/2018 1043   BUN 11 01/20/2014 1649   CREATININE 0.85 11/01/2021 2032   CREATININE 0.90 04/10/2016 0001   CALCIUM 9.0 11/01/2021 2032   CALCIUM 9.4 01/20/2014 1649   PROT 8.4 (H) 02/14/2019 1132   PROT 7.6 02/03/2018 1043   PROT 8.1 01/20/2014 1649   ALBUMIN 4.2 02/14/2019 1132   ALBUMIN 4.4 02/03/2018 1043   ALBUMIN 3.7 01/20/2014 1649   AST 19 02/14/2019 1132   AST 48 (H) 01/20/2014 1649   ALT 16 02/14/2019 1132   ALT 40 01/20/2014 1649   ALKPHOS 95 02/14/2019 1132   ALKPHOS 180 (H) 01/20/2014 1649   BILITOT 0.5 02/14/2019 1132   BILITOT 0.3 02/03/2018 1043    BILITOT 0.3 01/20/2014 1649   GFRNONAA >60 11/01/2021 2032   GFRNONAA 83 04/10/2016 0001   GFRAA >60 02/14/2019 1132   GFRAA >89 04/10/2016 0001   Lab Results  Component Value Date   WBC 6.1 06/15/2022   HGB 12.8 06/15/2022   HCT 38.9 06/15/2022   MCV 91.1 06/15/2022   PLT 204 06/15/2022    Imaging Studies: No results found.  Assessment and Plan:   Kari Gallagher is a 42 y.o. y/o female with a history of combined B12 and iron deficiency anemia referred to me for GI evaluation.  Follows with hematology for IV iron.  No prior GI evaluation.  No prior history of overt blood loss, history suggestive of menorrhagia   Plan 1.  EGD and colonoscopy showed no source of blood loss but has intestinal metaplasia will perform EGD for gastric mapping in 6 months.  Very likely blood loss is due to menorrhagia that has suggested to follow-up with her PCP 2.  Receiving IV iron with hematology and has a follow-up 3.  Suggested B12 supplementation at her last visit she says she has run out of tablets and will be getting some more.  She should follow-up with her levels with PCP/hematology 4.  She has had some blood in her urine in the past very likely due to menorrhagia which she should follow-up with Smitty Cords, DO   I have discussed alternative options, risks & benefits,  which include, but are not limited to, bleeding, infection, perforation,respiratory complication & drug reaction.  The patient agrees with this plan & written consent will be obtained.     F/u with me as needed     I discussed the assessment and treatment plan with the patient. The patient was provided an opportunity to ask questions and all were answered. The patient agreed with the plan and demonstrated an understanding of the instructions.   The patient was advised to call back or seek an in-person evaluation if the symptoms worsen or if the condition fails to improve as anticipated.  I provided 12 minutes of  face-to-face time during this encounter.  Dr [name] MD,MRCP [facility]) Gastroenterology/Hepatology Pager: [phone]   Speech recognition software was used to dictate this note.

## 2022-09-21 ENCOUNTER — Inpatient Hospital Stay: Payer: Medicaid Other

## 2022-09-25 ENCOUNTER — Inpatient Hospital Stay: Payer: Medicaid Other | Attending: Oncology

## 2022-09-25 DIAGNOSIS — D509 Iron deficiency anemia, unspecified: Secondary | ICD-10-CM | POA: Insufficient documentation

## 2022-09-25 LAB — CBC WITH DIFFERENTIAL (CANCER CENTER ONLY)
Abs Immature Granulocytes: 0.01 10*3/uL (ref 0.00–0.07)
Basophils Absolute: 0 10*3/uL (ref 0.0–0.1)
Basophils Relative: 0 %
Eosinophils Absolute: 0.1 10*3/uL (ref 0.0–0.5)
Eosinophils Relative: 2 %
HCT: 38.9 % (ref 36.0–46.0)
Hemoglobin: 12.5 g/dL (ref 12.0–15.0)
Immature Granulocytes: 0 %
Lymphocytes Relative: 29 %
Lymphs Abs: 1.5 10*3/uL (ref 0.7–4.0)
MCH: 31.2 pg (ref 26.0–34.0)
MCHC: 32.1 g/dL (ref 30.0–36.0)
MCV: 97 fL (ref 80.0–100.0)
Monocytes Absolute: 0.4 10*3/uL (ref 0.1–1.0)
Monocytes Relative: 7 %
Neutro Abs: 3.2 10*3/uL (ref 1.7–7.7)
Neutrophils Relative %: 62 %
Platelet Count: 190 10*3/uL (ref 150–400)
RBC: 4.01 MIL/uL (ref 3.87–5.11)
RDW: 12.5 % (ref 11.5–15.5)
WBC Count: 5.2 10*3/uL (ref 4.0–10.5)
nRBC: 0 % (ref 0.0–0.2)

## 2022-09-25 LAB — IRON AND TIBC
Iron: 101 ug/dL (ref 28–170)
Saturation Ratios: 31 % (ref 10.4–31.8)
TIBC: 330 ug/dL (ref 250–450)
UIBC: 229 ug/dL

## 2022-09-25 LAB — FERRITIN: Ferritin: 70 ng/mL (ref 11–307)

## 2022-10-16 ENCOUNTER — Telehealth: Payer: Medicaid Other | Admitting: Physician Assistant

## 2022-10-16 DIAGNOSIS — N760 Acute vaginitis: Secondary | ICD-10-CM | POA: Diagnosis not present

## 2022-10-16 DIAGNOSIS — B9689 Other specified bacterial agents as the cause of diseases classified elsewhere: Secondary | ICD-10-CM

## 2022-10-16 MED ORDER — FLUCONAZOLE 150 MG PO TABS: ORAL_TABLET | ORAL | 0 refills | Status: DC

## 2022-10-16 MED ORDER — METRONIDAZOLE 500 MG PO TABS
500.0000 mg | ORAL_TABLET | Freq: Two times a day (BID) | ORAL | 0 refills | Status: DC
Start: 2022-10-16 — End: 2022-11-22

## 2022-10-16 NOTE — Progress Notes (Signed)

## 2022-10-16 NOTE — Progress Notes (Signed)
I have spent 5 minutes in review of e-visit questionnaire, review and updating patient chart, medical decision making and response to patient.   Mia Milan Cody Jacklynn Dehaas, PA-C    

## 2022-11-22 ENCOUNTER — Encounter: Payer: Self-pay | Admitting: Family Medicine

## 2022-11-22 ENCOUNTER — Ambulatory Visit: Payer: Medicaid Other | Admitting: Family Medicine

## 2022-11-22 VITALS — BP 110/72 | Ht 65.0 in | Wt 170.8 lb

## 2022-11-22 DIAGNOSIS — B379 Candidiasis, unspecified: Secondary | ICD-10-CM | POA: Diagnosis not present

## 2022-11-22 DIAGNOSIS — E559 Vitamin D deficiency, unspecified: Secondary | ICD-10-CM | POA: Diagnosis not present

## 2022-11-22 DIAGNOSIS — R300 Dysuria: Secondary | ICD-10-CM | POA: Diagnosis not present

## 2022-11-22 DIAGNOSIS — E0500 Thyrotoxicosis with diffuse goiter without thyrotoxic crisis or storm: Secondary | ICD-10-CM

## 2022-11-22 DIAGNOSIS — E538 Deficiency of other specified B group vitamins: Secondary | ICD-10-CM | POA: Diagnosis not present

## 2022-11-22 DIAGNOSIS — E890 Postprocedural hypothyroidism: Secondary | ICD-10-CM

## 2022-11-22 DIAGNOSIS — N390 Urinary tract infection, site not specified: Secondary | ICD-10-CM | POA: Diagnosis not present

## 2022-11-22 MED ORDER — FLUCONAZOLE 150 MG PO TABS: ORAL_TABLET | ORAL | 0 refills | Status: DC

## 2022-11-22 MED ORDER — LEVOTHYROXINE SODIUM 125 MCG PO TABS
125.0000 ug | ORAL_TABLET | Freq: Every day | ORAL | 3 refills | Status: AC
Start: 2022-11-22 — End: ?

## 2022-11-22 MED ORDER — CEPHALEXIN 500 MG PO CAPS: 500.0000 mg | ORAL_CAPSULE | Freq: Three times a day (TID) | ORAL | 0 refills | Status: DC

## 2022-11-22 NOTE — Patient Instructions (Addendum)
Thank you for coming to the office today.  Urine today pending  Lab today for thyroid and vitamins  Stay tuned for results.   If Vitamins are low - we can consider injection series on the B12. - Usually it is weekly injection for 4 weeks then monthly for 4 months  Based on thyroid level, can resume Levothyroxine daily empty stomach, re ordered med today  Hematologist in December.   Please schedule a Follow-up Appointment to: Return if symptoms worsen or fail to improve.  If you have any other questions or concerns, please feel free to call the office or send a message through MyChart. You may also schedule an earlier appointment if necessary.  Additionally, you may be receiving a survey about your experience at our office within a few days to 1 week by e-mail or mail. We value your feedback.  [name], DO [facility], [place]

## 2022-11-22 NOTE — Progress Notes (Signed)
Subjective:    Patient ID: Kari Gallagher, female    DOB: 1980-10-03, 42 y.o.   MRN: [id number]  Kari Gallagher is a 42 y.o. female presenting on 11/22/2022 for Medical Management of Chronic Issues (Recheck thyroid levels.) and Dysuria (Since Monday)   HPI  Discussed the use of AI scribe software for clinical note transcription with the patient, who gave verbal consent to proceed.  Dysuria / Frequency / Possible UTI The patient presents with concerns of a possible urinary tract infection, reporting symptoms of increased urinary frequency and strong odor since Monday. She attributes the onset of symptoms to increased soda consumption during a recent out-of-town trip. The patient also reports neglecting her own health due to caregiving responsibilities for her mother, who has dementia. - Requesting urine testing today  Hypothyroidism, s/p RAI thyroid History of Grave's Disease Previously followed by Dr Lonzo Cloud [facility], has not returned yet for apt - Previously on Levothyroxine daily, now off med, requesting re order The patient has a history of thyroid disease and has been off thyroid medication for over a year due to personal circumstances. She expresses interest in checking her thyroid levels to determine if she needs to resume medication.  Vitamin B12 Insufficiency Vitamin D Deficiency She also requests checks for vitamin D and B12 and iron levels, as she has been juicing most of the time and wants to ensure her levels are adequate.  Anemia, normocytic The patient is also under the care of a hematologist [facility], with a follow-up appointment scheduled for December for iron anemia labs     Anxiety, generalized Major Depression recurrent, moderate Insomnia Primary caregiver for mother, with vascular dementia this is increasing her stress She admits episodic panic attacks        11/22/2022   10:31 AM 03/07/2022   11:03 AM 03/05/2022   10:11 AM  Depression  screen PHQ 2/9  Decreased Interest 0 1 2  Down, Depressed, Hopeless 0  2  PHQ - 2 Score 0 1 4  Altered sleeping   3  Tired, decreased energy   3  Change in appetite   3  Feeling bad or failure about yourself    1  Trouble concentrating   2  Moving slowly or fidgety/restless   2  Suicidal thoughts   0  PHQ-9 Score   18  Difficult doing work/chores   Somewhat difficult       11/22/2022   10:31 AM 03/05/2022   10:10 AM 05/23/2020    9:39 AM 10/15/2018   10:17 AM  GAD 7 : Generalized Anxiety Score  Nervous, Anxious, on Edge 0 2 1 0  Control/stop worrying 0 2 1 1   Worry too much - different things 0 2 1 1   Trouble relaxing 2 2 1 1   Restless 0 1 1 0  Easily annoyed or irritable 0 2 1 2   Afraid - awful might happen 1 1 0 0  Total GAD 7 Score 3 12 6 5   Anxiety Difficulty Somewhat difficult Somewhat difficult Not difficult at all     Social History   Tobacco Use   Smoking status: Every Day    Current packs/day: 0.50    Types: Cigarettes   Smokeless tobacco: Former  Building services engineer status: Never Used  Substance Use Topics   Alcohol use: No   Drug use: No    Review of Systems Per HPI unless specifically indicated above     Objective:  BP 110/72   Ht 5\' 5"  (1.651 m)   Wt 170 lb 12.8 oz (77.5 kg)   LMP 10/29/2022 (Approximate)   BMI 28.42 kg/m   Wt Readings from Last 3 Encounters:  11/22/22 170 lb 12.8 oz (77.5 kg)  07/26/22 166 lb (75.3 kg)  06/20/22 168 lb 3.2 oz (76.3 kg)    Physical Exam Vitals and nursing note reviewed.  Constitutional:      General: She is not in acute distress.    Appearance: Normal appearance. She is well-developed. She is not diaphoretic.     Comments: Well-appearing, comfortable, cooperative  HENT:     Head: Normocephalic and atraumatic.  Eyes:     General:        Right eye: No discharge.        Left eye: No discharge.     Conjunctiva/sclera: Conjunctivae normal.  Cardiovascular:     Rate and Rhythm: Normal rate.   Pulmonary:     Effort: Pulmonary effort is normal.  Skin:    General: Skin is warm and dry.     Findings: No erythema or rash.  Neurological:     Mental Status: She is alert and oriented to person, place, and time.  Psychiatric:        Mood and Affect: Mood normal.        Behavior: Behavior normal.        Thought Content: Thought content normal.     Comments: Well groomed, good eye contact, normal speech and thoughts     Results for orders placed or performed in visit on 09/25/22  Iron and TIBC([facility])  Result Value Ref Range   Iron 101 28 - 170 ug/dL   TIBC 782 956 - 213 ug/dL   Saturation Ratios 31 10.4 - 31.8 %   UIBC 229 ug/dL  Ferritin  Result Value Ref Range   Ferritin 70 11 - 307 ng/mL  CBC with Differential (Cancer Center Only)  Result Value Ref Range   WBC Count 5.2 4.0 - 10.5 K/uL   RBC 4.01 3.87 - 5.11 MIL/uL   Hemoglobin 12.5 12.0 - 15.0 g/dL   HCT 08.6 57.8 - 46.9 %   MCV 97.0 80.0 - 100.0 fL   MCH 31.2 26.0 - 34.0 pg   MCHC 32.1 30.0 - 36.0 g/dL   RDW 62.9 52.8 - 41.3 %   Platelet Count 190 150 - 400 K/uL   nRBC 0.0 0.0 - 0.2 %   Neutrophils Relative % 62 %   Neutro Abs 3.2 1.7 - 7.7 K/uL   Lymphocytes Relative 29 %   Lymphs Abs 1.5 0.7 - 4.0 K/uL   Monocytes Relative 7 %   Monocytes Absolute 0.4 0.1 - 1.0 K/uL   Eosinophils Relative 2 %   Eosinophils Absolute 0.1 0.0 - 0.5 K/uL   Basophils Relative 0 %   Basophils Absolute 0.0 0.0 - 0.1 K/uL   Immature Granulocytes 0 %   Abs Immature Granulocytes 0.01 0.00 - 0.07 K/uL      Assessment & Plan:   Problem List Items Addressed This Visit     Graves disease   Relevant Medications   levothyroxine (SYNTHROID) 125 MCG tablet   Hypothyroidism - Primary   Relevant Medications   levothyroxine (SYNTHROID) 125 MCG tablet   Other Relevant Orders   TSH   T4, free   Vitamin D deficiency   Relevant Orders   VITAMIN D 25 Hydroxy (Vit-D Deficiency, Fractures)   Other Visit Diagnoses  Dysuria       Relevant Medications   cephALEXin (KEFLEX) 500 MG capsule   Other Relevant Orders   POCT Urinalysis Dipstick   Urine Culture   Vitamin B12 nutritional deficiency       Relevant Orders   Vitamin B12   Antibiotic-induced yeast infection       Relevant Medications   cephALEXin (KEFLEX) 500 MG capsule   fluconazole (DIFLUCAN) 150 MG tablet         Possible Urinary Tract Infection Increased urinary frequency and strong odor since Monday. Unable to provide urine sample at this time. -Collect urine sample for urinalysis and culture. -Plan to review results on Monday and prescribe treatment if necessary. Note given out of office tomorrow / holiday next week, I agreed to PRINT a back up plan Keflex + Fluconazole for her if needed.  Hypothyroidism Hx Grave's Disease, then s/p Thyroid ablation RAI Previously followed by [facility] Dr Lonzo Cloud  Off levothyroxine for over a year due to personal circumstances. Last dose was daily. -Order thyroid function tests (TSH, free T4). -Reorder levothyroxine daily, to be started after confirming thyroid levels. -She will need to adhere to f/u for repeat labs after re-initiated thyroid replacement for 6+ weeks  Vitamin D and B12 Deficiency Concerns about possible deficiencies due to symptoms and past history. -Order Vitamin D and B12 levels. -Consider B12 injections if levels are low.  Iron Deficiency Managed by Hematology, next appointment in December. -Continue follow-up with Hematology.         Orders Placed This Encounter  Procedures   Urine Culture   TSH   T4, free   VITAMIN D 25 Hydroxy (Vit-D Deficiency, Fractures)   Vitamin B12   POCT Urinalysis Dipstick    Meds ordered this encounter  Medications   levothyroxine (SYNTHROID) 125 MCG tablet    Sig: Take 1 tablet (125 mcg total) by mouth daily.    Dispense:  90 tablet    Refill:  3   cephALEXin (KEFLEX) 500 MG capsule    Sig: Take 1  capsule (500 mg total) by mouth 3 (three) times daily. For 7 days    Dispense:  21 capsule    Refill:  0   fluconazole (DIFLUCAN) 150 MG tablet    Sig: Take one tablet by mouth on Day 1. Repeat dose 2nd tablet on Day 3.    Dispense:  2 tablet    Refill:  0    Follow up plan: Return if symptoms worsen or fail to improve.   [name], DO [facility] 11/22/2022, 10:10 AM

## 2022-11-23 ENCOUNTER — Telehealth: Payer: Self-pay | Admitting: Family Medicine

## 2022-11-23 DIAGNOSIS — E559 Vitamin D deficiency, unspecified: Secondary | ICD-10-CM

## 2022-11-23 NOTE — Telephone Encounter (Signed)
Medication Refill -  Most Recent Primary Care Visit:  Provider: Smitty Cords  Department: [facility]  Visit Type: OFFICE VISIT  Date: 11/22/2022  Medication: Vitamin D [address]  Has the patient contacted their pharmacy? Yes  (Agent: If yes, when and what did the pharmacy advise?)  Is this the correct pharmacy for this prescription? Yes If no, delete pharmacy and type the correct one.  This is the patient's preferred pharmacy:  [facility] [address] ([address] [place] - [address] [place] ([place]) [place] [address] Phone: [phone] Fax: [phone]   Has the prescription been filled recently? Yes  Is the patient out of the medication? Yes  Has the patient been seen for an appointment in the last year OR does the patient have an upcoming appointment? Yes  Can we respond through MyChart? Yes  Agent: Please be advised that Rx refills may take up to 3 business days. We ask that you follow-up with your pharmacy.

## 2022-11-25 LAB — VITAMIN B12: Vitamin B-12: 458 pg/mL (ref 200–1100)

## 2022-11-25 LAB — T4, FREE: Free T4: 0.6 ng/dL — ABNORMAL LOW (ref 0.8–1.8)

## 2022-11-25 LAB — URINE CULTURE
MICRO NUMBER:: 15763647
SPECIMEN QUALITY:: ADEQUATE

## 2022-11-25 LAB — VITAMIN D 25 HYDROXY (VIT D DEFICIENCY, FRACTURES): Vit D, 25-Hydroxy: 13 ng/mL — ABNORMAL LOW (ref 30–100)

## 2022-11-25 LAB — TSH: TSH: 29.63 m[IU]/L — ABNORMAL HIGH

## 2022-11-26 ENCOUNTER — Other Ambulatory Visit: Payer: Self-pay

## 2022-11-26 MED ORDER — VITAMIN D (ERGOCALCIFEROL) 1.25 MG (50000 UNIT) PO CAPS
50000.0000 [IU] | ORAL_CAPSULE | ORAL | 0 refills | Status: DC
Start: 2022-11-26 — End: 2023-08-19

## 2022-11-26 NOTE — Telephone Encounter (Signed)
Thank you.  Yes it looks like her lab results were reviewed by Rene Kocher already while I was out of office on Friday.  Her Vitamin D is low.  I will send the order now for Vitamin D3 50,000 weekly x 12 weeks then OTC version Vitamin D3 2,000 unit daily - can be purchased by patient to continue maintenance.  [name], DO [facility] 11/26/2022, 11:11 AM

## 2022-11-26 NOTE — Addendum Note (Signed)
Addended by: Smitty Cords on: 11/26/2022 11:11 AM   Modules accepted: Orders

## 2022-12-21 ENCOUNTER — Inpatient Hospital Stay: Payer: Medicaid Other | Attending: Oncology

## 2022-12-21 ENCOUNTER — Inpatient Hospital Stay (HOSPITAL_BASED_OUTPATIENT_CLINIC_OR_DEPARTMENT_OTHER): Payer: Medicaid Other | Admitting: Oncology

## 2022-12-21 ENCOUNTER — Encounter: Payer: Self-pay | Admitting: Oncology

## 2022-12-21 VITALS — BP 136/87 | HR 72 | Temp 97.0°F | Resp 18 | Wt 172.0 lb

## 2022-12-21 DIAGNOSIS — D509 Iron deficiency anemia, unspecified: Secondary | ICD-10-CM | POA: Diagnosis not present

## 2022-12-21 LAB — CBC WITH DIFFERENTIAL (CANCER CENTER ONLY)
Abs Immature Granulocytes: 0.02 10*3/uL (ref 0.00–0.07)
Basophils Absolute: 0 10*3/uL (ref 0.0–0.1)
Basophils Relative: 0 %
Eosinophils Absolute: 0.1 10*3/uL (ref 0.0–0.5)
Eosinophils Relative: 2 %
HCT: 38.5 % (ref 36.0–46.0)
Hemoglobin: 12.6 g/dL (ref 12.0–15.0)
Immature Granulocytes: 0 %
Lymphocytes Relative: 34 %
Lymphs Abs: 1.8 10*3/uL (ref 0.7–4.0)
MCH: 30.7 pg (ref 26.0–34.0)
MCHC: 32.7 g/dL (ref 30.0–36.0)
MCV: 93.9 fL (ref 80.0–100.0)
Monocytes Absolute: 0.4 10*3/uL (ref 0.1–1.0)
Monocytes Relative: 7 %
Neutro Abs: 2.9 10*3/uL (ref 1.7–7.7)
Neutrophils Relative %: 57 %
Platelet Count: 229 10*3/uL (ref 150–400)
RBC: 4.1 MIL/uL (ref 3.87–5.11)
RDW: 12.6 % (ref 11.5–15.5)
WBC Count: 5.1 10*3/uL (ref 4.0–10.5)
nRBC: 0 % (ref 0.0–0.2)

## 2022-12-21 LAB — IRON AND TIBC
Iron: 63 ug/dL (ref 28–170)
Saturation Ratios: 16 % (ref 10.4–31.8)
TIBC: 393 ug/dL (ref 250–450)
UIBC: 330 ug/dL

## 2022-12-21 LAB — FERRITIN: Ferritin: 53 ng/mL (ref 11–307)
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
## 2022-12-21 NOTE — Addendum Note (Signed)
Addended by: Corene Cornea on: 12/21/2022 02:30 PM   Modules accepted: Orders

## 2023-01-04 ENCOUNTER — Telehealth: Payer: Medicaid Other | Admitting: Family Medicine

## 2023-01-04 DIAGNOSIS — R300 Dysuria: Secondary | ICD-10-CM

## 2023-01-04 DIAGNOSIS — N390 Urinary tract infection, site not specified: Secondary | ICD-10-CM

## 2023-01-04 MED ORDER — CEPHALEXIN 500 MG PO CAPS: 500.0000 mg | ORAL_CAPSULE | Freq: Two times a day (BID) | ORAL | 0 refills | Status: AC

## 2023-01-04 NOTE — Progress Notes (Signed)

## 2023-01-06 DIAGNOSIS — R0982 Postnasal drip: Secondary | ICD-10-CM | POA: Diagnosis not present

## 2023-01-06 DIAGNOSIS — Z20822 Contact with and (suspected) exposure to covid-19: Secondary | ICD-10-CM | POA: Diagnosis not present

## 2023-01-06 DIAGNOSIS — H9201 Otalgia, right ear: Secondary | ICD-10-CM | POA: Diagnosis not present

## 2023-01-06 DIAGNOSIS — J029 Acute pharyngitis, unspecified: Secondary | ICD-10-CM | POA: Diagnosis not present

## 2023-02-08 ENCOUNTER — Telehealth: Payer: Self-pay | Admitting: Gastroenterology

## 2023-02-08 ENCOUNTER — Telehealth: Payer: Self-pay

## 2023-02-08 NOTE — Telephone Encounter (Signed)
 Pt requesting call back to discuss colonoscopy

## 2023-02-08 NOTE — Telephone Encounter (Signed)
-----   Message from [facility] Ginger F sent at 09/17/2022  2:01 PM EDT ----- EGD in 6 months (March 2025) for gastric mapping for intestinal metaplasia per Dr. Antony Baumgartner for OV on 09/17/2022. Pt notified you would be calling her to schedule in February.

## 2023-02-08 NOTE — Telephone Encounter (Signed)
 Called patient but had to leave her a detailed message letting her know that Dr. Antony Baumgartner had wanted for her to have a repeat EGD 6 months from last.

## 2023-02-11 ENCOUNTER — Encounter: Payer: Self-pay | Admitting: Oncology

## 2023-02-11 ENCOUNTER — Other Ambulatory Visit: Payer: Self-pay

## 2023-02-11 DIAGNOSIS — D508 Other iron deficiency anemias: Secondary | ICD-10-CM

## 2023-02-11 DIAGNOSIS — K31A0 Gastric intestinal metaplasia, unspecified: Secondary | ICD-10-CM

## 2023-02-11 NOTE — Telephone Encounter (Signed)
 Called patient to let her know that Dr. Antony Baumgartner wanted her to have a repeat EGD and patient agreed. She scheduled it for 02/27/2023. I let her know of her instructions and had no further questions. I will also send her instructions via MyChart.

## 2023-02-26 ENCOUNTER — Encounter: Payer: Self-pay | Admitting: Gastroenterology

## 2023-02-27 ENCOUNTER — Ambulatory Visit
Admission: RE | Admit: 2023-02-27 | Discharge: 2023-02-27 | Disposition: A | Discharge status: Home / Self Care | Payer: Medicaid Other | Attending: Gastroenterology | Admitting: Gastroenterology

## 2023-02-27 ENCOUNTER — Ambulatory Visit: Payer: Medicaid Other

## 2023-02-27 ENCOUNTER — Encounter: Payer: Self-pay | Admitting: Gastroenterology

## 2023-02-27 ENCOUNTER — Encounter
Admission: RE | Disposition: A | Discharge status: Home / Self Care | Payer: Self-pay | Source: Home / Self Care | Attending: Gastroenterology

## 2023-02-27 DIAGNOSIS — K2950 Unspecified chronic gastritis without bleeding: Secondary | ICD-10-CM | POA: Diagnosis not present

## 2023-02-27 DIAGNOSIS — K31A0 Gastric intestinal metaplasia, unspecified: Secondary | ICD-10-CM | POA: Diagnosis not present

## 2023-02-27 DIAGNOSIS — F17210 Nicotine dependence, cigarettes, uncomplicated: Secondary | ICD-10-CM | POA: Insufficient documentation

## 2023-02-27 DIAGNOSIS — E039 Hypothyroidism, unspecified: Secondary | ICD-10-CM | POA: Insufficient documentation

## 2023-02-27 DIAGNOSIS — K31A12 Gastric intestinal metaplasia without dysplasia, involving the body (corpus): Secondary | ICD-10-CM | POA: Diagnosis not present

## 2023-02-27 DIAGNOSIS — K31A19 Gastric intestinal metaplasia without dysplasia, unspecified site: Secondary | ICD-10-CM | POA: Diagnosis not present

## 2023-02-27 DIAGNOSIS — D508 Other iron deficiency anemias: Secondary | ICD-10-CM

## 2023-02-27 HISTORY — PX: BIOPSY: SHX5522

## 2023-02-27 HISTORY — PX: ESOPHAGOGASTRODUODENOSCOPY (EGD) WITH PROPOFOL: SHX5813

## 2023-02-27 SURGERY — ESOPHAGOGASTRODUODENOSCOPY (EGD) WITH PROPOFOL
Anesthesia: General

## 2023-02-27 MED ORDER — DEXMEDETOMIDINE HCL IN NACL 80 MCG/20ML IV SOLN
INTRAVENOUS | Status: DC | PRN
  Administered 2023-02-27: 20 ug via INTRAVENOUS

## 2023-02-27 MED ORDER — PROPOFOL 500 MG/50ML IV EMUL
INTRAVENOUS | Status: DC | PRN
  Administered 2023-02-27: 150 mg via INTRAVENOUS
  Administered 2023-02-27: 200 ug/kg/min via INTRAVENOUS

## 2023-02-27 MED ORDER — SODIUM CHLORIDE 0.9 % IV SOLN: INTRAVENOUS | Status: DC

## 2023-02-27 MED ORDER — LIDOCAINE HCL (PF) 2 % IJ SOLN
INTRAMUSCULAR | Status: DC | PRN
  Administered 2023-02-27: 100 mg via INTRADERMAL

## 2023-02-27 NOTE — Op Note (Signed)
 [facility]astroenterology Patient Name: Kari Gallagher Procedure Date: 02/27/2023 10:37 AM MRN: [id number] Account #: [id number] Date of Birth: [birth year] Admit Type: Outpatient Age: 43 Room: [facility] ENDO ROOM 3 Gender: Female Note Status: Finalized Instrument Name: Upper Endoscope [id number] Procedure:             Upper GI endoscopy Indications:           Follow-up of intestinal metaplasia Providers:             [name] MD, MD Referring MD:          Smitty Cords (Referring MD) Medicines:             Monitored Anesthesia Care Complications:         No immediate complications. Procedure:             Pre-Anesthesia Assessment:                        - Prior to the procedure, a History and Physical was                         performed, and patient medications, allergies and                         sensitivities were reviewed. The patient's tolerance                         of previous anesthesia was reviewed.                        - The risks and benefits of the procedure and the                         sedation options and risks were discussed with the                         patient. All questions were answered and informed                         consent was obtained.                        - ASA Grade Assessment: II - A patient with mild                         systemic disease.                        After obtaining informed consent, the endoscope was                         passed under direct vision. Throughout the procedure,                         the patient's blood pressure, pulse, and oxygen                         saturations were monitored continuously. The Endoscope  was introduced through the mouth, and advanced to the                         third part of duodenum. The upper GI endoscopy was                         accomplished with ease. The patient tolerated the                         procedure  well. Findings:      The esophagus was normal.      The examined duodenum was normal.      The entire examined stomach was normal. Biopsies were taken with a cold       forceps for histology.      The cardia and gastric fundus were normal on retroflexion. Impression:            - Normal esophagus.                        - Normal examined duodenum.                        - Normal stomach. Biopsied. Recommendation:        - Await pathology results.                        - Discharge patient to home (with escort).                        - Resume previous diet.                        - Continue present medications. Procedure Code(s):     --- Professional ---                        [phone], Esophagogastroduodenoscopy, flexible,                         transoral; with biopsy, single or multiple Diagnosis Code(s):     --- Professional ---                        K31.A0, Gastric intestinal metaplasia, unspecified CPT copyright 2022 American Medical Association. All rights reserved. The codes documented in this report are preliminary and upon coder review may  be revised to meet current compliance requirements. [name], MD [name] MD, MD 02/27/2023 11:20:19 AM This report has been signed electronically. Number of Addenda: 0 Note Initiated On: 02/27/2023 10:37 AM Estimated Blood Loss:  Estimated blood loss: none.      [facility]
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
## 2023-02-27 NOTE — Anesthesia Postprocedure Evaluation (Signed)
 Anesthesia Post Note  Patient: Kari Gallagher  Procedure(s) Performed: ESOPHAGOGASTRODUODENOSCOPY (EGD) WITH PROPOFOL BIOPSY  Patient location during evaluation: Endoscopy Anesthesia Type: General Level of consciousness: awake and alert Pain management: pain level controlled Vital Signs Assessment: post-procedure vital signs reviewed and stable Respiratory status: spontaneous breathing, nonlabored ventilation, respiratory function stable and patient connected to nasal cannula oxygen Cardiovascular status: blood pressure returned to baseline and stable Postop Assessment: no apparent nausea or vomiting Anesthetic complications: no   There were no known notable events for this encounter.   Last Vitals:  Vitals:   02/27/23 1143 02/27/23 1154  BP: (!) 93/54 99/65  Pulse: (!) 51 (!) 55  Resp: 13 (!) 9  Temp:    SpO2: 99% 98%    Last Pain:  Vitals:   02/27/23 1154  TempSrc:   PainSc: 0-No pain                 Cleda Mccreedy Abelina Ketron

## 2023-02-27 NOTE — Transfer of Care (Signed)
 Immediate Anesthesia Transfer of Care Note  Patient: Kari Gallagher  Procedure(s) Performed: ESOPHAGOGASTRODUODENOSCOPY (EGD) WITH PROPOFOL BIOPSY  Patient Location: PACU  Anesthesia Type:General  Level of Consciousness: sedated  Airway & Oxygen Therapy: Patient Spontanous Breathing  Post-op Assessment: Report given to RN and Post -op Vital signs reviewed and stable  Post vital signs: Reviewed and stable  Last Vitals:  Vitals Value Taken Time  BP 88/53 02/27/23 1123  Temp    Pulse 65 02/27/23 1123  Resp 19 02/27/23 1123  SpO2 96 % 02/27/23 1123  Vitals shown include unfiled device data.  Last Pain:  Vitals:   02/27/23 1051  TempSrc: Temporal         Complications: There were no known notable events for this encounter.

## 2023-02-27 NOTE — Anesthesia Preprocedure Evaluation (Signed)
 Anesthesia Evaluation  Patient identified by MRN, date of birth, ID band Patient awake    Reviewed: Allergy & Precautions, NPO status , Patient's Chart, lab work & pertinent test results  History of Anesthesia Complications Negative for: history of anesthetic complications  Airway Mallampati: III  TM Distance: >3 FB Neck ROM: full    Dental  (+) Chipped, Poor Dentition   Pulmonary neg pulmonary ROS, neg shortness of breath, Current Smoker and Patient abstained from smoking.   Pulmonary exam normal        Cardiovascular (-) angina negative cardio ROS Normal cardiovascular exam     Neuro/Psych  Headaches  negative psych ROS   GI/Hepatic negative GI ROS, Neg liver ROS,,,  Endo/Other  Hypothyroidism    Renal/GU negative Renal ROS  negative genitourinary   Musculoskeletal   Abdominal   Peds  Hematology negative hematology ROS (+)   Anesthesia Other Findings Past Medical History: No date: Frequent headaches No date: Hypothyroidism No date: Iron deficiency anemia No date: Leaky heart valve  Past Surgical History: No date: CHOLECYSTECTOMY 07/26/2022: COLONOSCOPY WITH PROPOFOL; N/A     Comment:  Procedure: COLONOSCOPY WITH PROPOFOL;  Surgeon: Toney Reil, MD;  Location: [facility];                Service: Endoscopy;  Laterality: N/A; 07/26/2022: ESOPHAGOGASTRODUODENOSCOPY (EGD) WITH PROPOFOL; N/A     Comment:  Procedure: ESOPHAGOGASTRODUODENOSCOPY (EGD) WITH               PROPOFOL;  Surgeon: Toney Reil, MD;  Location:               [facility];  Service: Endoscopy;  Laterality:               N/A; 2006: TUBAL LIGATION  BMI    Body Mass Index: 29.09 kg/m      Reproductive/Obstetrics negative OB ROS                             Anesthesia Physical Anesthesia Plan  ASA: 2  Anesthesia Plan: General   Post-op Pain Management:     Induction: Intravenous  PONV Risk Score and Plan: Propofol infusion and TIVA  Airway Management Planned: Natural Airway and Nasal Cannula  Additional Equipment:   Intra-op Plan:   Post-operative Plan:   Informed Consent: I have reviewed the patients History and Physical, chart, labs and discussed the procedure including the risks, benefits and alternatives for the proposed anesthesia with the patient or authorized representative who has indicated his/her understanding and acceptance.     Dental Advisory Given  Plan Discussed with: Anesthesiologist, CRNA and Surgeon  Anesthesia Plan Comments: (Patient consented for risks of anesthesia including but not limited to:  - adverse reactions to medications - risk of airway placement if required - damage to eyes, teeth, lips or other oral mucosa - nerve damage due to positioning  - sore throat or hoarseness - Damage to heart, brain, nerves, lungs, other parts of body or loss of life  Patient voiced understanding and assent.)       Anesthesia Quick Evaluation

## 2023-02-27 NOTE — Progress Notes (Signed)
 Unable to void for the urine preg test. Patient states "I am not pregnant, I have had my tubes tied and going through a divorce".

## 2023-02-28 ENCOUNTER — Encounter: Payer: Self-pay | Admitting: Gastroenterology

## 2023-03-01 LAB — SURGICAL PATHOLOGY

## 2023-03-05 ENCOUNTER — Encounter: Payer: Self-pay | Admitting: Gastroenterology

## 2023-03-09 ENCOUNTER — Telehealth: Admitting: Nurse Practitioner

## 2023-03-09 DIAGNOSIS — T3695XA Adverse effect of unspecified systemic antibiotic, initial encounter: Secondary | ICD-10-CM | POA: Diagnosis not present

## 2023-03-09 DIAGNOSIS — B379 Candidiasis, unspecified: Secondary | ICD-10-CM | POA: Diagnosis not present

## 2023-03-09 DIAGNOSIS — N760 Acute vaginitis: Secondary | ICD-10-CM | POA: Diagnosis not present

## 2023-03-09 DIAGNOSIS — B9689 Other specified bacterial agents as the cause of diseases classified elsewhere: Secondary | ICD-10-CM | POA: Diagnosis not present

## 2023-03-09 MED ORDER — METRONIDAZOLE 500 MG PO TABS: 500.0000 mg | ORAL_TABLET | Freq: Two times a day (BID) | ORAL | 0 refills | Status: AC

## 2023-03-09 MED ORDER — FLUCONAZOLE 150 MG PO TABS: 150.0000 mg | ORAL_TABLET | ORAL | 0 refills | Status: DC | PRN

## 2023-03-09 NOTE — Progress Notes (Signed)
 I have spent 5 minutes in review of e-visit questionnaire, review and updating patient chart, medical decision making and response to patient.   Claiborne Rigg, NP

## 2023-03-09 NOTE — Progress Notes (Signed)

## 2023-03-14 ENCOUNTER — Telehealth: Payer: Self-pay

## 2023-03-14 NOTE — Telephone Encounter (Signed)
Called and left a message for call back. Sent mychart message to patient  ?

## 2023-03-14 NOTE — Telephone Encounter (Signed)
-----   Message from [name] sent at 03/05/2023 10:50 AM EST ----- Inform the EGD shows gastritis with intestinal metaplasia as seen previously no other changes.  Repeat upper endoscopy in 3 years

## 2023-04-09 DIAGNOSIS — M5412 Radiculopathy, cervical region: Secondary | ICD-10-CM | POA: Diagnosis not present

## 2023-04-12 ENCOUNTER — Ambulatory Visit: Admitting: Family Medicine

## 2023-04-12 ENCOUNTER — Encounter: Payer: Self-pay | Admitting: Family Medicine

## 2023-04-12 VITALS — BP 124/80 | HR 78 | Ht 65.0 in | Wt 180.0 lb

## 2023-04-12 DIAGNOSIS — M546 Pain in thoracic spine: Secondary | ICD-10-CM | POA: Diagnosis not present

## 2023-04-12 DIAGNOSIS — M25511 Pain in right shoulder: Secondary | ICD-10-CM | POA: Diagnosis not present

## 2023-04-12 DIAGNOSIS — M7551 Bursitis of right shoulder: Secondary | ICD-10-CM

## 2023-04-12 DIAGNOSIS — S46811A Strain of other muscles, fascia and tendons at shoulder and upper arm level, right arm, initial encounter: Secondary | ICD-10-CM

## 2023-04-12 DIAGNOSIS — R202 Paresthesia of skin: Secondary | ICD-10-CM

## 2023-04-12 DIAGNOSIS — M542 Cervicalgia: Secondary | ICD-10-CM | POA: Diagnosis not present

## 2023-04-12 MED ORDER — PREDNISONE 20 MG PO TABS: ORAL_TABLET | ORAL | 0 refills | Status: DC

## 2023-04-12 MED ORDER — NAPROXEN 500 MG PO TABS: 500.0000 mg | ORAL_TABLET | Freq: Two times a day (BID) | ORAL | 2 refills | Status: AC

## 2023-04-12 MED ORDER — BACLOFEN 10 MG PO TABS
5.0000 mg | ORAL_TABLET | Freq: Three times a day (TID) | ORAL | 2 refills | Status: DC | PRN
Start: 2023-04-12 — End: 2023-08-19

## 2023-04-12 NOTE — Patient Instructions (Addendum)
 Thank you for coming to the office today.  Likely muscle strain spasm and nerve pinching.  Walk in for X-rays next week  Proceed w/ Massage therapy  Start taking Baclofen (Lioresal) 10mg  (muscle relaxant) - start with half (cut) to one whole pill at night as needed for next 1-3 nights (may make you drowsy, caution with driving) see how it affects you, then if tolerated increase to one pill 2 to 3 times a day or (every 8 hours as needed)  Recommend trial of Anti-inflammatory with Naproxen (Naprosyn) 500mg  tabs - take one with food and plenty of water TWICE daily every day (breakfast and dinner), for next 2 to 4 weeks, then you may take only as needed - DO NOT TAKE any ibuprofen, aleve, motrin while you are taking this medicine - It is safe to take Tylenol Ext Str 500mg  tabs - take 1 to 2 (max dose 1000mg ) every 6 hours as needed for breakthrough pain, max 24 hour daily dose is 6 to 8 tablets or 4000mg   If severe symptoms, can stop Naproxen and take Prednisone taper  Okay with massage therapy   Please schedule a Follow-up Appointment to: Return in about 4 weeks (around 05/10/2023), or if symptoms worsen or fail to improve.  If you have any other questions or concerns, please feel free to call the office or send a message through MyChart. You may also schedule an earlier appointment if necessary.  Additionally, you may be receiving a survey about your experience at our office within a few days to 1 week by e-mail or mail. We value your feedback.  [name], DO [facility], [place]

## 2023-04-12 NOTE — Progress Notes (Signed)
 Subjective:    Patient ID: Kari Gallagher, female    DOB: 08-01-1980, 43 y.o.   MRN: [id number]  Kari Gallagher is a 43 y.o. female presenting on 04/12/2023 for Shoulder Pain (Trapezius, upper back and lower neck pain)  Patient presents for a same day appointment.   HPI  Discussed the use of AI scribe software for clinical note transcription with the patient, who gave verbal consent to proceed.  History of Present Illness   Kari Gallagher is a 43 year old female who presents with right arm pain and tingling.  She experiences pressure and tingling sensations in the upper back, base of the neck, and shoulder region, extending into the right arm. 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 The entire right hand, including all five fingers, feels sore, and her wrist was swollen earlier in the week. No prior history of carpal tunnel syndrome or wrist issues, and these symptoms are new to her.  She visited [facility] on Tuesday, where she was informed that her symptoms might be nerve-related. She has difficulty performing tasks that require gripping or turning, such as opening jars, and often asks her daughter for assistance. She has not noticed any significant improvement with the muscle relaxant cyclobenzaprine (Flexeril) 5 mg, which she took once and found it made her sleepy. She has not taken anti-inflammatories recently but has used naproxen in the past.  She has been experiencing headaches, which she attributes to tension, and notes that her symptoms have been exacerbated by her work activities. She is concerned about swelling in her feet, which she associates with standing on cement for long periods.  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 Her son assists with any necessary lifting. She has been diagnosed with fibromyalgia in the past, which influences her ability to lift heavy objects.         04/12/2023   11:15 AM 11/22/2022   10:31 AM 03/07/2022   11:03 AM  Depression screen PHQ 2/9  Decreased Interest 0 0 1  Down, Depressed, Hopeless 0 0   PHQ - 2 Score 0 0 1  Altered sleeping 2    Tired, decreased energy 2    Change in appetite 2    Feeling bad or failure about yourself  0    Trouble concentrating 1    Moving slowly or fidgety/restless 0    Suicidal thoughts 0    PHQ-9 Score 7    Difficult doing work/chores Somewhat difficult         04/12/2023   11:15 AM 11/22/2022   10:31 AM 03/05/2022   10:10 AM 05/23/2020    9:39 AM  GAD 7 : Generalized Anxiety Score  Nervous, Anxious, on Edge 0 0 2 1  Control/stop worrying 2 0 2 1  Worry too much - different things 1 0 2 1  Trouble relaxing 2 2 2 1   Restless 1 0 1 1  Easily annoyed or irritable 1 0 2 1  Afraid - awful might happen 0 1 1 0  Total GAD 7 Score 7 3 12 6   Anxiety Difficulty Somewhat difficult Somewhat difficult Somewhat difficult Not difficult at all  Social History   Tobacco Use   Smoking status: Every Day    Current packs/day: 0.50    Types: Cigarettes   Smokeless tobacco: Former  Building services engineer status: Never Used  Substance Use Topics   Alcohol use: No   Drug use: No    Review of Systems Per HPI unless specifically indicated above     Objective:    BP 124/80 (BP Location: Left Arm, Patient Position: Sitting, Cuff Size: Normal)   Pulse 78   Ht 5\' 5"  (1.651 m)   Wt 180 lb (81.6 kg)   SpO2 100%   BMI 29.95 kg/m   Wt Readings from Last 3 Encounters:  04/12/23 180 lb (81.6 kg)  02/27/23 174 lb 12.8 oz (79.3 kg)  12/21/22 172 lb (78 kg)    Physical Exam Vitals and nursing note reviewed.  Constitutional:      General: She is not in acute distress.     Appearance: Normal appearance. She is well-developed. She is not diaphoretic.     Comments: Well-appearing, comfortable, cooperative  HENT:     Head: Normocephalic and atraumatic.  Eyes:     General:        Right eye: No discharge.        Left eye: No discharge.     Conjunctiva/sclera: Conjunctivae normal.  Neck:     Comments: C Spine range of motion normal Spurling maneuver negative on R side Cardiovascular:     Rate and Rhythm: Normal rate.  Pulmonary:     Effort: Pulmonary effort is normal.  Musculoskeletal:     Cervical back: Normal range of motion. No rigidity or tenderness.     Comments: Right Shoulder Inspection: Normal appearance bilateral symmetrical Palpation: Non-tender to palpation over anterior, lateral, or posterior shoulder  ROM: Reduced forward flex and abduction and int rotation due to soreness Special Testing: Rotator cuff testing negative for weakness with supraspinatus full can and empty can test, Positive impingement for pain Strength: Normal strength 5/5 flex/ext, ext rot / int rot, grip, rotator cuff str testing. Neurovascular: Distally intact pulses, sensation to light touch   Lymphadenopathy:     Cervical: No cervical adenopathy.  Skin:    General: Skin is warm and dry.     Findings: No erythema or rash.  Neurological:     Mental Status: She is alert and oriented to person, place, and time.  Psychiatric:        Mood and Affect: Mood normal.        Behavior: Behavior normal.        Thought Content: Thought content normal.     Comments: Well groomed, good eye contact, normal speech and thoughts     Results for orders placed or performed during the hospital encounter of 02/27/23  Surgical pathology   Collection Time: 02/27/23 12:00 AM  Result Value Ref Range   SURGICAL PATHOLOGY      SURGICAL PATHOLOGY [facility] [address] [place], [place] [address] Telephone [phone] or [phone] Fax [phone]  REPORT OF SURGICAL PATHOLOGY   Accession #: [id number] Patient Name: CAMAY, PEDIGO Visit # : [id number]  MRN: [id number] Physician: [name] DOB/Age [birth year] (Age: 55) Gender: F Collected Date: 02/27/2023 Received Date: 02/27/2023  FINAL DIAGNOSIS       1. Stomach- Antrum, distal lesser curvature; cbx :       - PATCHY CHRONIC GASTRITIS.HELICOBACTER PYLORI IMMUNOSTAINING NEGATIVE.      - NEGATIVE  FOR INTESTINAL METAPLASIA, DYSPLASIA, OR MALIGNANCY.       2. Stomach- Antrum, distal greater curvature; cbx :       - ANTRAL MUCOSA, NEGATIVE FOR INFLAMMATION, INTESTINAL METAPLASIA, DYSPLASIA, OR      MALIGNANCY.       3. Stomach, biopsy, lesser curvature of incisura; cbx :       - FOCAL MILD CHRONIC GASTRITIS.NEGATIVE FOR INTESTINAL METAPLASIA, DYSPLASIA OR      MALIGNANCY.       4. Stomach, biop sy, proximal corpus, lesser curvature; cbx :       - DIFFUSE CHRONIC GASTRITIS, WITH LOCALIZED INTESTINAL METAPLASIA.      - NEGATIVE FOR DYSPLASIA OR MALIGNANCY.      - NEGATIVE FOR H. PYLORI IMMUNOHISTOCHEMICAL STAINING.       5. Stomach, biopsy, proximal corpus, greater curvature; cbx :       - DIFFUSE CHRONIC GASTRITIS WITH LOCALIZED INTESTINAL METAPLASIA.NEGATIVE FOR      DYSPLASIA OR MALIGNANCY.      - HELICOBACTER PYLORI IMMUNOSTAINING NEGATIVE.       ELECTRONIC SIGNATURE : [place] Md, Merchant navy officer, Sports administrator, International aid/development worker  MICROSCOPIC DESCRIPTION  CASE COMMENTS STAINS USED IN DIAGNOSIS: H&E Universal Negative Control-DAB H&E H&E H&E H&E Stains used in diagnosis 1 H. Pylori IHC, 4 H. Pylori IHC, 5 H. Pylori IHC Some of these immunohistochemical stains may have been developed and the performance characteristics determined by [facility].  Some may not have been cleared or approved by the U.S. Food and Drug Administration.  The FDA has  determined that such clearance or approval is not necessary.  This test is used for clinical  purposes.  It should not be regarded as investigational or for research.  This laboratory is certified under the Clinical Laboratory Improvement Amendments of 1988 (CLIA-88) as qualified to perform high complexity clinical laboratory testing.    CLINICAL HISTORY  SPECIMEN(S) OBTAINED 1. Stomach- Antrum, Distal Lesser Curvature; Cbx 2. Stomach- Antrum, Distal Greater Curvature; Cbx 3. Stomach, biopsy, Lesser Curvature Of Incisura; Cbx 4. Stomach, biopsy, Proximal Corpus, Lesser Curvature; Cbx 5. Stomach, biopsy, Proximal Corpus, Greater Curvature; Cbx  SPECIMEN COMMENTS: SPECIMEN CLINICAL INFORMATION: 1. IDA.Intestinal metaplasia of stomach    Gross Description 1. "CBX distal antrum lesser curvature", received in formalin are 2 white-pink tissue fragments that are 0.3 x 0.1 x 0.1 cm and 0.6 x 0.2 x 0.1 cm.The specimen is submitted in toto in 1 block (1A). 2. "CBX dista l antrum greater curvature", received in formalin is a 0.7 x 0.3 x 0.1 cm aggregate of 4 tan-pink tissue fragments.The specimen is submitted in toto in 1 block (2A). 3. "CBX lesser curvature of incisura", received in formalin is a 0.5 x 0.5 x 0.1 cm aggregate of 3 white-pink tissue fragments.The specimen is submitted  in toto in 1 block (3A). 4. "CBX proximal corpus, lesser curvature", received in formalin is a 0.6 x 0.4 x 0.1 cm aggregate of 3 white-pink tissue fragments.The specimen is submitted in toto in 1 block (4A). 5. "CBX  proximal corpus, greater curvature", received in formalin is a 0.6 x 0.4 x 0.1 cm aggregate  of 3 white-pink tissue fragments.The specimen is submitted in toto in 1 block (5A).      [facility] 02/27/2023        Report signed out from the following location(s) [place] [address], [place] [address] CLIA #: [id number]  [facility] [address] [place], [place] [address]  CLIA #: [id number]       Assessment & Plan:   Problem List  Items Addressed This Visit     Neck pain   Relevant Orders   DG Cervical Spine Complete   Other Visit Diagnoses       Paresthesia of right upper extremity    -  Primary   Relevant Medications   baclofen (LIORESAL) 10 MG tablet   naproxen (NAPROSYN) 500 MG tablet   predniSONE (DELTASONE) 20 MG tablet     Strain of right trapezius muscle, initial encounter         Acute pain of right shoulder         Bursitis of right shoulder       Relevant Medications   naproxen (NAPROSYN) 500 MG tablet   predniSONE (DELTASONE) 20 MG tablet     Acute bilateral thoracic back pain       Relevant Medications   cyclobenzaprine (FLEXERIL) 5 MG tablet   baclofen (LIORESAL) 10 MG tablet   naproxen (NAPROSYN) 500 MG tablet   predniSONE (DELTASONE) 20 MG tablet   Other Relevant Orders   DG Thoracic Spine W/Swimmers        Rotator Cuff Strain and Upper Back/Neck Muscle Strain Rotator cuff and upper back/neck muscle strain due to repetitive lifting at work.  No acute injury Cervical spine testing did not confirm cervical radiculopathy, seems more shoulder level or lower Symptoms from muscle inflammation and nerve pinching, not carpal tunnel syndrome. Rest and proper technique advised. Muscle relaxants and anti-inflammatories are first-line treatments.  -Stop Flexeril due to sedation - Prescribed baclofen 10 mg as needed for muscle relaxation, preferably at night or when resting. - Prescribed naproxen 500mg  twice daily with meals for anti-inflammatory effects. - Ordered x-rays of the upper back and neck for next week. - Recommended massage therapy for muscle relaxation. - Provided work note excusing absence from 04/15/2023 to 04/17/2023. - Discussed prednisone as a backup if current treatment fails, with instructions not to combine with naproxen. - Advised potential referral to sports medicine or physical therapy if no improvement.  Tension Headache Likely tension headaches due to neck and upper back  muscle tightness. Current treatment plan should alleviate symptoms. - Continue treatment plan for muscle strain to address tension headaches.    Orders Placed This Encounter  Procedures   DG Cervical Spine Complete    Standing Status:   Future    Expiration Date:   04/11/2024    Reason for Exam (SYMPTOM  OR DIAGNOSIS REQUIRED):   persistent neck and upper back shoulder blade pain    Is patient pregnant?:   No    Preferred imaging location?:   [facility] W/Swimmers    Standing Status:   Future    Expiration Date:   04/11/2024    Reason for Exam (SYMPTOM  OR DIAGNOSIS REQUIRED):   persistent neck and upper back shoulder blade pain    Is patient pregnant?:   No    Preferred imaging location?:   [facility]    Meds ordered this encounter  Medications   baclofen (LIORESAL) 10 MG tablet    Sig: Take 0.5-1 tablets (5-10 mg total) by mouth 3 (three) times daily as needed for muscle spasms.    Dispense:  30 each    Refill:  2   naproxen (NAPROSYN) 500 MG tablet    Sig: Take 1 tablet (500 mg total) by mouth 2 (two) times daily with a meal.  For 2-4 weeks then as needed    Dispense:  60 tablet    Refill:  2   predniSONE (DELTASONE) 20 MG tablet    Sig: Take daily with food. Start with 60mg  (3 pills) x 2 days, then reduce to 40mg  (2 pills) x 2 days, then 20mg  (1 pill) x 3 days    Dispense:  13 tablet    Refill:  0    Follow up plan: Return in about 4 weeks (around 05/10/2023), or if symptoms worsen or fail to improve.    [name], DO [facility] 04/12/2023, 11:17 AM

## 2023-04-16 ENCOUNTER — Ambulatory Visit
Admission: RE | Admit: 2023-04-16 | Discharge: 2023-04-16 | Disposition: A | Discharge status: Home / Self Care | Attending: Family Medicine | Admitting: Family Medicine

## 2023-04-16 ENCOUNTER — Ambulatory Visit
Admission: RE | Admit: 2023-04-16 | Discharge: 2023-04-16 | Disposition: A | Discharge status: Home / Self Care | Source: Ambulatory Visit | Attending: Family Medicine | Admitting: Family Medicine

## 2023-04-16 DIAGNOSIS — M542 Cervicalgia: Secondary | ICD-10-CM

## 2023-04-16 DIAGNOSIS — M546 Pain in thoracic spine: Secondary | ICD-10-CM | POA: Insufficient documentation

## 2023-04-16 DIAGNOSIS — M50323 Other cervical disc degeneration at C6-C7 level: Secondary | ICD-10-CM | POA: Diagnosis not present

## 2023-04-16 DIAGNOSIS — M438X4 Other specified deforming dorsopathies, thoracic region: Secondary | ICD-10-CM | POA: Diagnosis not present

## 2023-04-16 DIAGNOSIS — M4802 Spinal stenosis, cervical region: Secondary | ICD-10-CM | POA: Diagnosis not present

## 2023-04-16 DIAGNOSIS — M549 Dorsalgia, unspecified: Secondary | ICD-10-CM | POA: Diagnosis not present

## 2023-04-22 ENCOUNTER — Inpatient Hospital Stay: Payer: Medicaid Other | Attending: Oncology

## 2023-04-22 DIAGNOSIS — D509 Iron deficiency anemia, unspecified: Secondary | ICD-10-CM | POA: Diagnosis present

## 2023-04-22 LAB — CBC
HCT: 35.8 % — ABNORMAL LOW (ref 36.0–46.0)
Hemoglobin: 11.4 g/dL — ABNORMAL LOW (ref 12.0–15.0)
MCH: 29 pg (ref 26.0–34.0)
MCHC: 31.8 g/dL (ref 30.0–36.0)
MCV: 91.1 fL (ref 80.0–100.0)
Platelets: 220 10*3/uL (ref 150–400)
RBC: 3.93 MIL/uL (ref 3.87–5.11)
RDW: 13.1 % (ref 11.5–15.5)
WBC: 4.9 10*3/uL (ref 4.0–10.5)
nRBC: 0 % (ref 0.0–0.2)

## 2023-04-22 LAB — IRON AND TIBC
Iron: 37 ug/dL (ref 28–170)
Saturation Ratios: 10 % — ABNORMAL LOW (ref 10.4–31.8)
TIBC: 381 ug/dL (ref 250–450)
UIBC: 344 ug/dL

## 2023-04-22 LAB — FERRITIN: Ferritin: 24 ng/mL (ref 11–307)

## 2023-04-23 ENCOUNTER — Other Ambulatory Visit: Payer: Self-pay | Admitting: Oncology

## 2023-04-23 ENCOUNTER — Telehealth: Payer: Self-pay

## 2023-04-23 NOTE — Telephone Encounter (Signed)
 Let me know what iron she can have

## 2023-04-23 NOTE — Telephone Encounter (Signed)
 Per Dr. Randy Buttery "Her iron levels are low. Does she want iv iron?".  Outbound call to patient; informed of above.  Patient would like to move forward with IV iron; she said she figured it was low since she's been feeling weak.  Informed our scheduling team would be in touch shortly to coordinate.  Advised to contact clinic for any new and/or worsening symptoms.

## 2023-04-23 NOTE — Telephone Encounter (Signed)
-----   Message from Avonne Boettcher sent at 04/23/2023  8:31 AM EDT ----- Her iron levels are low. Does she want iv iron?

## 2023-04-25 ENCOUNTER — Inpatient Hospital Stay

## 2023-04-25 VITALS — BP 99/69 | HR 77 | Resp 16

## 2023-04-25 DIAGNOSIS — D509 Iron deficiency anemia, unspecified: Secondary | ICD-10-CM | POA: Diagnosis not present

## 2023-04-25 MED ORDER — SODIUM CHLORIDE 0.9 % IV SOLN
Freq: Once | INTRAVENOUS | Status: AC
  Filled 2023-04-25: qty 250

## 2023-04-25 MED ORDER — SODIUM CHLORIDE 0.9 % IV SOLN
1000.0000 mg | Freq: Once | INTRAVENOUS | Status: AC
  Administered 2023-04-25: 1000 mg via INTRAVENOUS
  Filled 2023-04-25: qty 1000

## 2023-05-03 ENCOUNTER — Encounter: Payer: Self-pay | Admitting: Family Medicine

## 2023-05-10 ENCOUNTER — Telehealth: Payer: Self-pay

## 2023-05-10 DIAGNOSIS — M542 Cervicalgia: Secondary | ICD-10-CM

## 2023-05-10 DIAGNOSIS — M546 Pain in thoracic spine: Secondary | ICD-10-CM

## 2023-05-10 NOTE — Addendum Note (Signed)
 Addended by: Carollynn Cirri on: 05/10/2023 01:09 PM   Modules accepted: Orders

## 2023-05-10 NOTE — Telephone Encounter (Signed)
 Copied from CRM [id number]. Topic: General - Other >> May 10, 2023 12:12 PM [place] B wrote: Reason for CRM: pt called in states, dr K asked if she wants to do physical therapy, because of her lab results, and she states yes and would like it to be in [place].

## 2023-05-10 NOTE — Telephone Encounter (Signed)
 Patient notified referral has been sent.

## 2023-05-10 NOTE — Telephone Encounter (Signed)
Referral to physical therapy placed per patient request.

## 2023-06-03 ENCOUNTER — Ambulatory Visit

## 2023-06-03 DIAGNOSIS — Z111 Encounter for screening for respiratory tuberculosis: Secondary | ICD-10-CM | POA: Diagnosis not present

## 2023-06-05 LAB — QUANTIFERON-TB GOLD PLUS
Mitogen-NIL: 8.63 [IU]/mL
NIL: 0.06 [IU]/mL
QuantiFERON-TB Gold Plus: NEGATIVE
TB1-NIL: 0 [IU]/mL
TB2-NIL: 0 [IU]/mL

## 2023-06-06 ENCOUNTER — Ambulatory Visit: Payer: Self-pay | Admitting: Family Medicine

## 2023-06-10 ENCOUNTER — Telehealth: Admitting: Physician Assistant

## 2023-06-10 ENCOUNTER — Telehealth: Payer: Self-pay

## 2023-06-10 DIAGNOSIS — N760 Acute vaginitis: Secondary | ICD-10-CM

## 2023-06-10 DIAGNOSIS — B379 Candidiasis, unspecified: Secondary | ICD-10-CM | POA: Diagnosis not present

## 2023-06-10 DIAGNOSIS — B9689 Other specified bacterial agents as the cause of diseases classified elsewhere: Secondary | ICD-10-CM | POA: Diagnosis not present

## 2023-06-10 DIAGNOSIS — T3695XA Adverse effect of unspecified systemic antibiotic, initial encounter: Secondary | ICD-10-CM

## 2023-06-10 MED ORDER — FLUCONAZOLE 150 MG PO TABS: 150.0000 mg | ORAL_TABLET | ORAL | 0 refills | Status: DC | PRN

## 2023-06-10 MED ORDER — METRONIDAZOLE 500 MG PO TABS
500.0000 mg | ORAL_TABLET | Freq: Two times a day (BID) | ORAL | 0 refills | Status: AC
Start: 2023-06-10 — End: 2023-06-17

## 2023-06-10 NOTE — Progress Notes (Signed)
 E-Visit for Vaginal Symptoms  We are sorry that you are not feeling well. Here is how we plan to help! Based on what you shared with me it looks like you: May have a vaginosis due to bacteria  Vaginosis is an inflammation of the vagina that can result in discharge, itching and pain. The cause is usually a change in the normal balance of vaginal bacteria or an infection. Vaginosis can also result from reduced estrogen levels after menopause.  The most common causes of vaginosis are:   Bacterial vaginosis which results from an overgrowth of one on several organisms that are normally present in your vagina.   Yeast infections which are caused by a naturally occurring fungus called candida.   Vaginal atrophy (atrophic vaginosis) which results from the thinning of the vagina from reduced estrogen levels after menopause.   Trichomoniasis which is caused by a parasite and is commonly transmitted by sexual intercourse.  Factors that increase your risk of developing vaginosis include: Medications, such as antibiotics and steroids Uncontrolled diabetes Use of hygiene products such as bubble bath, vaginal spray or vaginal deodorant Douching Wearing damp or tight-fitting clothing Using an intrauterine device (IUD) for birth control Hormonal changes, such as those associated with pregnancy, birth control pills or menopause Sexual activity Having a sexually transmitted infection  Your treatment plan is Metronidazole or Flagyl 500mg  twice a day for 7 days.  I have electronically sent this prescription into the pharmacy that you have chosen.  Diflucan given as prophylaxis as patient tends to get vaginal yeast infections with antibiotic use.  Be sure to take all of the medication as directed. Stop taking any medication if you develop a rash, tongue swelling or shortness of breath. Mothers who are breast feeding should consider pumping and discarding their breast milk while on these antibiotics.  However, there is no consensus that infant exposure at these doses would be harmful.  Remember that medication creams can weaken latex condoms. Marland Kitchen   HOME CARE:  Good hygiene may prevent some types of vaginosis from recurring and may relieve some symptoms:  Avoid baths, hot tubs and whirlpool spas. Rinse soap from your outer genital area after a shower, and dry the area well to prevent irritation. Don't use scented or harsh soaps, such as those with deodorant or antibacterial action. Avoid irritants. These include scented tampons and pads. Wipe from front to back after using the toilet. Doing so avoids spreading fecal bacteria to your vagina.  Other things that may help prevent vaginosis include:  Don't douche. Your vagina doesn't require cleansing other than normal bathing. Repetitive douching disrupts the normal organisms that reside in the vagina and can actually increase your risk of vaginal infection. Douching won't clear up a vaginal infection. Use a latex condom. Both female and female latex condoms may help you avoid infections spread by sexual contact. Wear cotton underwear. Also wear pantyhose with a cotton crotch. If you feel comfortable without it, skip wearing underwear to bed. Yeast thrives in [employer] Your symptoms should improve in the next day or two.  GET HELP RIGHT AWAY IF:  You have pain in your lower abdomen ( pelvic area or over your ovaries) You develop nausea or vomiting You develop a fever Your discharge changes or worsens You have persistent pain with intercourse You develop shortness of breath, a rapid pulse, or you faint.  These symptoms could be signs of problems or infections that need to be evaluated by a medical provider now.  MAKE SURE YOU   Understand these instructions. Will watch your condition. Will get help right away if you are not doing well or get worse.  Thank you for choosing an e-visit.  Your e-visit answers were reviewed by a  board certified advanced clinical practitioner to complete your personal care plan. Depending upon the condition, your plan could have included both over the counter or prescription medications.  Please review your pharmacy choice. Make sure the pharmacy is open so you can pick up prescription now. If there is a problem, you may contact your provider through [employer] and have the prescription routed to another pharmacy.  Your safety is important to [place]. If you have drug allergies check your prescription carefully.   For the next 24 hours you can use MyChart to ask questions about today's visit, request a non-urgent call back, or ask for a work or school excuse. You will get an email in the next two days asking about your experience. I hope that your e-visit has been valuable and will speed your recovery.    I have spent 5 minutes in review of e-visit questionnaire, review and updating patient chart, medical decision making and response to patient.   Margaretann Loveless, PA-C

## 2023-06-10 NOTE — Telephone Encounter (Signed)
 Copied from CRM [id number]. Topic: Clinical - Lab/Test Results >> Jun 10, 2023  4:27 PM Antwanette L wrote: Reason for CRM: The patient wants to know if we can send her TB results as a letter form in MyChart?

## 2023-06-11 ENCOUNTER — Ambulatory Visit: Attending: Internal Medicine

## 2023-06-11 DIAGNOSIS — M25511 Pain in right shoulder: Secondary | ICD-10-CM | POA: Insufficient documentation

## 2023-06-11 DIAGNOSIS — M542 Cervicalgia: Secondary | ICD-10-CM | POA: Insufficient documentation

## 2023-06-11 DIAGNOSIS — M546 Pain in thoracic spine: Secondary | ICD-10-CM | POA: Diagnosis not present

## 2023-06-11 DIAGNOSIS — M5412 Radiculopathy, cervical region: Secondary | ICD-10-CM | POA: Diagnosis present

## 2023-06-11 NOTE — Therapy (Signed)
 OUTPATIENT PHYSICAL THERAPY SHOULDER EVALUATION   Patient Name: Kari Gallagher MRN: [id number] DOB:1980/11/13, 43 y.o., female Today's Date: 06/12/2023  END OF SESSION:  PT End of Session - 06/12/23 1121     Visit Number 1    Date for PT Re-Evaluation 08/07/23    Progress Note Due on Visit 10    PT Start Time 1023    PT Stop Time 1102    PT Time Calculation (min) 39 min    Activity Tolerance Patient limited by pain    Behavior During Therapy [facility] for tasks assessed/performed             Past Medical History:  Diagnosis Date   Frequent headaches    Hypothyroidism    Iron deficiency anemia    Leaky heart valve    Past Surgical History:  Procedure Laterality Date   BIOPSY  02/27/2023   Procedure: BIOPSY;  Surgeon: Luke Salaam, MD;  Location: [facility] ENDOSCOPY;  Service: Gastroenterology;;   CHOLECYSTECTOMY     COLONOSCOPY WITH PROPOFOL  N/A 07/26/2022   Procedure: COLONOSCOPY WITH PROPOFOL ;  Surgeon: Selena Daily, MD;  Location: [facility];  Service: Endoscopy;  Laterality: N/A;   ESOPHAGOGASTRODUODENOSCOPY (EGD) WITH PROPOFOL  N/A 07/26/2022   Procedure: ESOPHAGOGASTRODUODENOSCOPY (EGD) WITH PROPOFOL ;  Surgeon: Selena Daily, MD;  Location: [facility];  Service: Endoscopy;  Laterality: N/A;   ESOPHAGOGASTRODUODENOSCOPY (EGD) WITH PROPOFOL  N/A 02/27/2023   Procedure: ESOPHAGOGASTRODUODENOSCOPY (EGD) WITH PROPOFOL ;  Surgeon: Luke Salaam, MD;  Location: [facility] ENDOSCOPY;  Service: Gastroenterology;  Laterality: N/A;   TUBAL LIGATION  2006   Patient Active Problem List   Diagnosis Date Noted   Intestinal metaplasia of stomach 02/27/2023   Iron deficiency anemia 03/07/2022   Breast pain 11/21/2021   Vitamin D  insufficiency 08/25/2020   Chondromalacia patellae 04/11/2020   Hirsutism 10/20/2019   Lymphedema of both lower extremities 06/23/2019   Graves disease 02/03/2019   Neck pain 08/11/2018   Migraine with aura and with status migrainosus, not  intractable 03/26/2016   Peripheral edema 03/26/2016   Pre-diabetes 01/14/2016   Insomnia 12/07/2015   Major depressive disorder, recurrent, moderate (HCC) 10/03/2015   Hypothyroidism 08/22/2015   Vitamin D  deficiency 08/22/2015    PCP: Domingo Friend, DO  REFERRING PROVIDER: Helayne Lo, NP  REFERRING DIAG: cervical spine and R shoulder pain  THERAPY DIAG:  Radiculopathy, cervical region  Acute pain of right shoulder  Rationale for Evaluation and Treatment: Rehabilitation  ONSET DATE: Feb 2025  SUBJECTIVE:  SUBJECTIVE STATEMENT: Onset of R shoulder pain, weakness, pain lower cervical spine post and interferes with sleep and daily activities was working a job using both arms for assembly but stopped in around [place], sx had started before then but worse with the repetitive movements arms Hand dominance: Right  PERTINENT HISTORY: Chronic anemia   PAIN:  Are you having pain? Yes: NPRS scale: 3 to9 Pain location: lower c spine, occipital region with headaches, R arm feels weak Pain description: constant, worse at night wakes her up Aggravating factors: sleeping, lifting arms Relieving factors: med help minimally  PRECAUTIONS: None  RED FLAGS: na   WEIGHT BEARING RESTRICTIONS: No  FALLS:  Has patient fallen in last 6 months? 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  Pt assists with dressing, feeds her mother, manages medical appts  PLOF: Independent  PATIENT GOALS:resolve pain and headaches  NEXT MD  VISIT:   OBJECTIVE:  Note: Objective measures were completed at Evaluation unless otherwise noted.  DIAGNOSTIC FINDINGS:  CERVICAL SPINE - COMPLETE 4+ VIEW   COMPARISON:  None Available.   FINDINGS: Cervical spine alignment is maintained. Vertebral body heights are preserved. The dens is intact. Posterior elements appear well-aligned. Anterior spurring at multiple levels with slight disc space narrowing at C6-C7. No high-grade bony neural foraminal stenosis. There is no evidence of fracture or focal bone abnormality. No prevertebral soft tissue edema.   IMPRESSION: Mild degenerative disc disease at C6-C7.     Electronically Signed   By: Chadwick Colonel M.D.   On: 05/01/2023 13:45   Twelve rib-bearing thoracic vertebra. Slight levo scoliotic curvature of the lower thoracic spine. Normal thoracic kyphosis. Normal vertebral body heights. No fracture compression deformity. The disc spaces are preserved. There is trace anterior spurring in the midthoracic spine. No evidence of focal bone abnormality. No paravertebral soft tissue abnormalities.   IMPRESSION: Slight levo scoliotic curvature of the lower thoracic spine. Trace anterior spurring in the midthoracic spine.     Electronically Signed   By: Chadwick Colonel M.D.   On: 05/01/2023 13:42  PATIENT SURVEYS:  NDI Neck Disability Index score: 35 / 50 = 70.0 %  COGNITION: Overall cognitive status: Within functional limits for tasks assessed     SENSATION: Intermittent radicular Sx r UE, no sensory deficits at time of PT eval  POSTURE: Guarded depressed and rounded B shoulders, forward head, avoids upper cervical extension  UPPER EXTREMITY ROM: arom with stiffness, labored B shoulder terminal elevation, final 15 degrees Cervical ROM upright wfl for rotation, ext 50%,  In supine cervical flex/rot test with 50 % limit R upper cervical rot  UPPER EXTREMITY MMT: Strength deficits noted grossly R UE for grasp, wrist  ext, triceps, biceps, 4-/5, R shoulder ER /IR wnl strength   JOINT MOBILITY TESTING:  R sided lateral glides referred pain from c spine into R medial scapular region Light distraction of c spine provided some relief of sx PALPATION:  Very tender post upper cervical, suboccipital musculature, upper traps, levator on R primarily PA central glides spinous processes T 1 to T 4 very tender localized  TREATMENT DATE: 06/12/23:  Somewhat abbreviated due to later start: Supine for gentle bouts of intermittent cervical traction Supine for contract/relax for R upper cervical rotation with some improvement in movement noted Attempted isometric cervical rot in supine with increase in pain Added 2 I pieces of kinesiotape middle traps region pulling from center laterally to assist with upper and middle traps function/ pain relief   PATIENT EDUCATION: Education details: POC, goals Person educated: Patient Education method: Explanation, Demonstration, Tactile cues, and Verbal cues Education comprehension: verbalized understanding, returned demonstration, verbal cues required, tactile cues required, and needs further education  HOME EXERCISE PROGRAM: TBD  ASSESSMENT:  CLINICAL IMPRESSION: Patient is a 43 y.o. female who was evaluated today for physical therapy for R sided cervical radiculopathy.  She is quite inflamed, irritable with provocation of sx/ radicular sx with very light, minimal palpation and movement.  Did respond well to manual gentle cervical traction, less so to attempted therex.  Needs gentle approach initially assisting with inflammation then progressing to strengthening. Also she is primary caregiver for her wheelchair bound mother, probably not able to get good rest/ sleep .   OBJECTIVE IMPAIRMENTS: decreased knowledge of condition, decreased ROM, decreased  strength, increased edema, increased fascial restrictions, increased muscle spasms, impaired flexibility, and pain.   ACTIVITY LIMITATIONS: carrying, lifting, sleeping, reach over head, and caring for others  PARTICIPATION LIMITATIONS: meal prep, cleaning, laundry, driving, and community activity  PERSONAL FACTORS: Behavior pattern, Fitness, Past/current experiences, Social background, Time since onset of injury/illness/exacerbation, and 1-2 comorbidities: anemia,caregiver for elderly parent are also affecting patient's functional outcome.   REHAB POTENTIAL: Good  CLINICAL DECISION MAKING: Evolving/moderate complexity  EVALUATION COMPLEXITY: Moderate   GOALS: Goals reviewed with patient? Yes  SHORT TERM GOALS: Target date: 2 weeks 06/25/23  I HEP Baseline: Goal status: INITIAL   LONG TERM GOALS: Target date: 8 weeks, 08/07/23  Improve NDI score 35/50 or 70% to 25 /5 or 50% or less disability Baseline:  Goal status: INITIAL  2.  Improve R UE strength for grasp, wrist ext, triceps, biceps to wnl Baseline: 4-/5 Goal status: INITIAL  3.  Improve upper cervical spine R Rotation to wnl Baseline: 50% with cervical flex/ rot test Goal status: INITIAL   PLAN:  PT FREQUENCY: 1-2x/week  PT DURATION: 8 weeks  PLANNED INTERVENTIONS: 97110-Therapeutic exercises, 97530- Therapeutic activity, [id number]- Neuromuscular re-education, 97535- Self Care, 16109- Manual therapy, 97012- Traction (mechanical), [phone]- Ionotophoresis 4mg /ml Dexamethasone, and Joint mobilization  PLAN FOR NEXT SESSION: initiate mechanical cervical traction, manual techniques gentle stretching c spine, isometrics c spine   Malak Duchesneau L Elford Evilsizer, PT, DPT, OCS 06/12/2023, 11:51 AM

## 2023-06-12 ENCOUNTER — Other Ambulatory Visit: Payer: Self-pay

## 2023-06-12 ENCOUNTER — Telehealth: Payer: Self-pay

## 2023-06-12 NOTE — Telephone Encounter (Signed)
 Copied from CRM [id number]. Topic: Clinical - Lab/Test Results >> Jun 12, 2023  9:08 AM Juluis Ok wrote: Patient calling to check the status of TB letter, she states it is not available in her MyChart

## 2023-06-13 ENCOUNTER — Ambulatory Visit

## 2023-06-20 ENCOUNTER — Ambulatory Visit

## 2023-06-20 DIAGNOSIS — M5412 Radiculopathy, cervical region: Secondary | ICD-10-CM | POA: Diagnosis not present

## 2023-06-20 DIAGNOSIS — M25511 Pain in right shoulder: Secondary | ICD-10-CM

## 2023-06-20 NOTE — Therapy (Signed)
 OUTPATIENT PHYSICAL THERAPY SHOULDER TREATMENT   Patient Name: Kari Gallagher MRN: [id number] DOB:11/02/1980, 43 y.o., female Today's Date: 06/20/2023  END OF SESSION:  PT End of Session - 06/20/23 1057     Visit Number 2    Date for PT Re-Evaluation 08/07/23    Progress Note Due on Visit 10    PT Start Time 1026   pt late   PT Stop Time 1100    PT Time Calculation (min) 34 min    Activity Tolerance Patient tolerated treatment well    Behavior During Therapy [facility] for tasks assessed/performed           Past Medical History:  Diagnosis Date   Frequent headaches    Hypothyroidism    Iron deficiency anemia    Leaky heart valve    Past Surgical History:  Procedure Laterality Date   BIOPSY  02/27/2023   Procedure: BIOPSY;  Surgeon: Luke Salaam, MD;  Location: [facility] ENDOSCOPY;  Service: Gastroenterology;;   CHOLECYSTECTOMY     COLONOSCOPY WITH PROPOFOL  N/A 07/26/2022   Procedure: COLONOSCOPY WITH PROPOFOL ;  Surgeon: Selena Daily, MD;  Location: [facility];  Service: Endoscopy;  Laterality: N/A;   ESOPHAGOGASTRODUODENOSCOPY (EGD) WITH PROPOFOL  N/A 07/26/2022   Procedure: ESOPHAGOGASTRODUODENOSCOPY (EGD) WITH PROPOFOL ;  Surgeon: Selena Daily, MD;  Location: [facility];  Service: Endoscopy;  Laterality: N/A;   ESOPHAGOGASTRODUODENOSCOPY (EGD) WITH PROPOFOL  N/A 02/27/2023   Procedure: ESOPHAGOGASTRODUODENOSCOPY (EGD) WITH PROPOFOL ;  Surgeon: Luke Salaam, MD;  Location: [facility] ENDOSCOPY;  Service: Gastroenterology;  Laterality: N/A;   TUBAL LIGATION  2006   Patient Active Problem List   Diagnosis Date Noted   Intestinal metaplasia of stomach 02/27/2023   Iron deficiency anemia 03/07/2022   Breast pain 11/21/2021   Vitamin D  insufficiency 08/25/2020   Chondromalacia patellae 04/11/2020   Hirsutism 10/20/2019   Lymphedema of both lower extremities 06/23/2019   Graves disease 02/03/2019   Neck pain 08/11/2018   Migraine with aura and with status  migrainosus, not intractable 03/26/2016   Peripheral edema 03/26/2016   Pre-diabetes 01/14/2016   Insomnia 12/07/2015   Major depressive disorder, recurrent, moderate (HCC) 10/03/2015   Hypothyroidism 08/22/2015   Vitamin D  deficiency 08/22/2015    PCP: Domingo Friend, DO  REFERRING PROVIDER: Helayne Lo, NP  REFERRING DIAG: cervical spine and R shoulder pain  THERAPY DIAG:  Radiculopathy, cervical region  Acute pain of right shoulder  Rationale for Evaluation and Treatment: Rehabilitation  ONSET DATE: Feb 2025  SUBJECTIVE:  SUBJECTIVE STATEMENT: Pt reports that her pain is better, she feels that the tape has helped and she has not had to do any strenuous work either.  Hand dominance: Right  PERTINENT HISTORY: Chronic anemia   PAIN:  Are you having pain? Yes: NPRS scale: 5/10 Pain location: R upper trap Pain description: constant, worse at night wakes her up Aggravating factors: sleeping, lifting arms Relieving factors: med help minimally  PRECAUTIONS: None  RED FLAGS: na   WEIGHT BEARING RESTRICTIONS: No  FALLS:  Has patient fallen in last 6 months? 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  Pt assists with dressing, feeds her mother, manages medical appts  PLOF: Independent  PATIENT GOALS:resolve pain and headaches  NEXT MD VISIT:   OBJECTIVE:  Note: Objective measures were completed at Evaluation unless otherwise noted.  DIAGNOSTIC FINDINGS:  CERVICAL SPINE - COMPLETE 4+ VIEW    COMPARISON:  None Available.   FINDINGS: Cervical spine alignment is maintained. Vertebral body heights are preserved. The dens is intact. Posterior elements appear well-aligned. Anterior spurring at multiple levels with slight disc space narrowing at C6-C7. No high-grade bony neural foraminal stenosis. There is no evidence of fracture or focal bone abnormality. No prevertebral soft tissue edema.   IMPRESSION: Mild degenerative disc disease at C6-C7.     Electronically Signed   By: Chadwick Colonel M.D.   On: 05/01/2023 13:45   Twelve rib-bearing thoracic vertebra. Slight levo scoliotic curvature of the lower thoracic spine. Normal thoracic kyphosis. Normal vertebral body heights. No fracture compression deformity. The disc spaces are preserved. There is trace anterior spurring in the midthoracic spine. No evidence of focal bone abnormality. No paravertebral soft tissue abnormalities.   IMPRESSION: Slight levo scoliotic curvature of the lower thoracic spine. Trace anterior spurring in the midthoracic spine.     Electronically Signed   By: Chadwick Colonel M.D.   On: 05/01/2023 13:42  PATIENT SURVEYS:  NDI Neck Disability Index score: 35 / 50 = 70.0 %  COGNITION: Overall cognitive status: Within functional limits for tasks assessed     SENSATION: Intermittent radicular Sx r UE, no sensory deficits at time of PT eval  POSTURE: Guarded depressed and rounded B shoulders, forward head, avoids upper cervical extension  UPPER EXTREMITY ROM: arom with stiffness, labored B shoulder terminal elevation, final 15 degrees Cervical ROM upright wfl for rotation, ext 50%,  In supine cervical flex/rot test with 50 % limit R upper cervical rot  UPPER EXTREMITY MMT: Strength deficits noted grossly R UE for grasp, wrist ext, triceps, biceps, 4-/5, R shoulder ER /IR wnl strength   JOINT MOBILITY TESTING:  R sided lateral glides referred pain from c spine into R medial scapular  region Light distraction of c spine provided some relief of sx PALPATION:  Very tender post upper cervical, suboccipital musculature, upper traps, levator on R primarily PA central glides spinous processes T 1 to T 4 very tender localized  TREATMENT DATE:  06/20/23 STM to B UT, LS, cervical paraspinals UT stretch x 30 B LS stretch x 30 B Self snag with  06/12/23:  Somewhat abbreviated due to later start: Supine for gentle bouts of intermittent cervical traction Supine for contract/relax for R upper cervical rotation with some improvement in movement noted Attempted isometric cervical rot in supine with increase in pain Added 2 I pieces of kinesiotape middle traps region pulling from center laterally to assist with upper and middle traps function/ pain relief   PATIENT EDUCATION: Education details: POC, goals Person educated: Patient Education method: Explanation, Demonstration, Tactile cues, and Verbal cues Education comprehension: verbalized understanding, returned demonstration, verbal cues required, tactile cues required, and needs further education  HOME EXERCISE PROGRAM: Access Code: [email or web address] URL: [email or web address] Date: 06/20/2023 Prepared by: Nyellie Yetter  Exercises - Seated Upper Trapezius Stretch  - 1 x daily - 7 x weekly - 3 sets - 3 reps - 30 sec hold - Gentle Levator Scapulae Stretch  - 1 x daily - 7 x weekly - 3 sets - 3 reps - 30 sec hold - Mid-Lower Cervical Extension SNAG with Strap  - 1 x daily - 7 x weekly - 3 sets - 10 reps - 3- 5 sec hold  ASSESSMENT:  CLINICAL IMPRESSION: Pt reports resolution of radicular pain today, so we did not do mechanical traction. We did work on manual techniques to improve tension in her neck and shoulders, which was pretty TTP on R. Added stretches for the neck and put into HEP for gentle  mobilization. Pt responded to treatment.  Eval: Patient is a 43 y.o. female who was evaluated today for physical therapy for R sided cervical radiculopathy.  She is quite inflamed, irritable with provocation of sx/ radicular sx with very light, minimal palpation and movement.  Did respond well to manual gentle cervical traction, less so to attempted therex.  Needs gentle approach initially assisting with inflammation then progressing to strengthening. Also she is primary caregiver for her wheelchair bound mother, probably not able to get good rest/ sleep .   OBJECTIVE IMPAIRMENTS: decreased knowledge of condition, decreased ROM, decreased strength, increased edema, increased fascial restrictions, increased muscle spasms, impaired flexibility, and pain.   ACTIVITY LIMITATIONS: carrying, lifting, sleeping, reach over head, and caring for others  PARTICIPATION LIMITATIONS: meal prep, cleaning, laundry, driving, and community activity  PERSONAL FACTORS: Behavior pattern, Fitness, Past/current experiences, Social background, Time since onset of injury/illness/exacerbation, and 1-2 comorbidities: anemia,caregiver for elderly parent are also affecting patient's functional outcome.   REHAB POTENTIAL: Good  CLINICAL DECISION MAKING: Evolving/moderate complexity  EVALUATION COMPLEXITY: Moderate   GOALS: Goals reviewed with patient? Yes  SHORT TERM GOALS: Target date: 2 weeks 06/25/23  I HEP Baseline: Goal status: INITIAL   LONG TERM GOALS: Target date: 8 weeks, 08/07/23  Improve NDI score 35/50 or 70% to 25 /5 or 50% or less disability Baseline:  Goal status: INITIAL  2.  Improve R UE strength for grasp, wrist ext, triceps, biceps to wnl Baseline: 4-/5 Goal status: INITIAL  3.  Improve upper cervical spine R Rotation to wnl Baseline: 50% with cervical flex/ rot test Goal status: INITIAL   PLAN:  PT FREQUENCY: 1-2x/week  PT DURATION: 8 weeks  PLANNED INTERVENTIONS:  97110-Therapeutic exercises, 97530- Therapeutic activity, [id number]- Neuromuscular re-education, 97535- Self Care, [id number]- Manual therapy, 97012- Traction (mechanical), [phone]- Ionotophoresis 4mg /ml Dexamethasone, and Joint mobilization  PLAN FOR NEXT SESSION: initiate mechanical cervical traction, manual techniques gentle stretching c spine, isometrics c  spine   Samuella Crocker, PTA 06/20/2023, 11:12 AM

## 2023-06-25 ENCOUNTER — Ambulatory Visit

## 2023-06-27 ENCOUNTER — Encounter

## 2023-07-09 ENCOUNTER — Ambulatory Visit: Attending: Internal Medicine | Admitting: Rehabilitation

## 2023-07-09 ENCOUNTER — Encounter: Payer: Self-pay | Admitting: Rehabilitation

## 2023-07-09 DIAGNOSIS — M5412 Radiculopathy, cervical region: Secondary | ICD-10-CM | POA: Diagnosis present

## 2023-07-09 DIAGNOSIS — M25511 Pain in right shoulder: Secondary | ICD-10-CM | POA: Insufficient documentation

## 2023-07-09 NOTE — Therapy (Signed)
 OUTPATIENT PHYSICAL THERAPY SHOULDER TREATMENT   Patient Name: Kari Gallagher MRN: [id number] DOB:1980/10/30, 43 y.o., female Today's Date: 07/09/2023  END OF SESSION:  PT End of Session - 07/09/23 1029     Visit Number 3    Date for PT Re-Evaluation 08/07/23    Progress Note Due on Visit 10    PT Start Time 1020    PT Stop Time 1115    PT Time Calculation (min) 55 min    Activity Tolerance Patient tolerated treatment well    Behavior During Therapy [facility] for tasks assessed/performed            Past Medical History:  Diagnosis Date   Frequent headaches    Hypothyroidism    Iron deficiency anemia    Leaky heart valve    Past Surgical History:  Procedure Laterality Date   BIOPSY  02/27/2023   Procedure: BIOPSY;  Surgeon: Therisa Bi, MD;  Location: [facility] ENDOSCOPY;  Service: Gastroenterology;;   CHOLECYSTECTOMY     COLONOSCOPY WITH PROPOFOL  N/A 07/26/2022   Procedure: COLONOSCOPY WITH PROPOFOL ;  Surgeon: Unk Corinn Skiff, MD;  Location: [facility];  Service: Endoscopy;  Laterality: N/A;   ESOPHAGOGASTRODUODENOSCOPY (EGD) WITH PROPOFOL  N/A 07/26/2022   Procedure: ESOPHAGOGASTRODUODENOSCOPY (EGD) WITH PROPOFOL ;  Surgeon: Unk Corinn Skiff, MD;  Location: [facility] SURGERY [facility];  Service: Endoscopy;  Laterality: N/A;   ESOPHAGOGASTRODUODENOSCOPY (EGD) WITH PROPOFOL  N/A 02/27/2023   Procedure: ESOPHAGOGASTRODUODENOSCOPY (EGD) WITH PROPOFOL ;  Surgeon: Therisa Bi, MD;  Location: [facility] ENDOSCOPY;  Service: Gastroenterology;  Laterality: N/A;   TUBAL LIGATION  2006   Patient Active Problem List   Diagnosis Date Noted   Intestinal metaplasia of stomach 02/27/2023   Iron deficiency anemia 03/07/2022   Breast pain 11/21/2021   Vitamin D  insufficiency 08/25/2020   Chondromalacia patellae 04/11/2020   Hirsutism 10/20/2019   Lymphedema of both lower extremities 06/23/2019   Graves disease 02/03/2019   Neck pain 08/11/2018   Migraine with aura and with status migrainosus,  not intractable 03/26/2016   Peripheral edema 03/26/2016   Pre-diabetes 01/14/2016   Insomnia 12/07/2015   Major depressive disorder, recurrent, moderate (HCC) 10/03/2015   Hypothyroidism 08/22/2015   Vitamin D  deficiency 08/22/2015    PCP: Edman Blunt, DO  REFERRING PROVIDER: Antonette Corrigan, NP  REFERRING DIAG: cervical spine and R shoulder pain  THERAPY DIAG:  Radiculopathy, cervical region  Acute pain of right shoulder  Rationale for Evaluation and Treatment: Rehabilitation  ONSET DATE: Feb 2025  SUBJECTIVE:  SUBJECTIVE STATEMENT: Patient states that her arm pain is better than it was.   However, she had a terrible HA and neck pain all weekend and just had to lie in the bed.   She states has been doing her neck stretching, but that nothing helped this weekend.    PERTINENT HISTORY: Chronic anemia   PAIN:  Are you having pain? Yes: NPRS scale: 6/10 Pain location: R upper trap Pain description: constant, worse at night wakes her up Aggravating factors: sleeping, lifting arms Relieving factors: med help minimally  PRECAUTIONS: None  RED FLAGS: na   WEIGHT BEARING RESTRICTIONS: No  FALLS:  Has patient fallen in last 6 months? 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  Pt assists with dressing, feeds her mother, manages medical appts  PLOF: Independent  PATIENT GOALS:resolve pain and headaches  NEXT MD VISIT:   OBJECTIVE:  Note: Objective measures were completed at Evaluation unless otherwise  noted.  DIAGNOSTIC FINDINGS:  CERVICAL SPINE - COMPLETE 4+ VIEW   COMPARISON:  None Available.   FINDINGS: Cervical spine alignment is maintained. Vertebral body heights are preserved. The dens is intact. Posterior elements appear well-aligned. Anterior spurring at multiple levels with slight disc space narrowing at C6-C7. No high-grade bony neural foraminal stenosis. There is no evidence of fracture or focal bone abnormality. No prevertebral soft tissue edema.   IMPRESSION: Mild degenerative disc disease at C6-C7.     Electronically Signed   By: Andrea Gasman M.D.   On: 05/01/2023 13:45   Twelve rib-bearing thoracic vertebra. Slight levo scoliotic curvature of the lower thoracic spine. Normal thoracic kyphosis. Normal vertebral body heights. No fracture compression deformity. The disc spaces are preserved. There is trace anterior spurring in the midthoracic spine. No evidence of focal bone abnormality. No paravertebral soft tissue abnormalities.   IMPRESSION: Slight levo scoliotic curvature of the lower thoracic spine. Trace anterior spurring in the midthoracic spine.     Electronically Signed   By: Andrea Gasman M.D.   On: 05/01/2023 13:42  PATIENT SURVEYS:  NDI Neck Disability Index score: 35 / 50 = 70.0 %  COGNITION: Overall cognitive status: Within functional limits for tasks assessed     SENSATION: Intermittent radicular Sx r UE, no sensory deficits at time of PT eval  POSTURE: Guarded depressed and rounded B shoulders, forward head, avoids upper cervical extension  UPPER EXTREMITY ROM: arom with stiffness, labored B shoulder terminal elevation, final 15 degrees Cervical ROM upright wfl for rotation, ext 50%,  In supine cervical flex/rot test with 50 % limit R upper cervical rot  UPPER EXTREMITY MMT: Strength deficits noted grossly R UE for grasp, wrist ext, triceps, biceps, 4-/5, R shoulder ER /IR wnl strength   JOINT MOBILITY TESTING:  R sided  lateral glides referred pain from c spine into R medial scapular region Light distraction of c spine provided some relief of sx PALPATION:  Very tender post upper cervical, suboccipital musculature, upper traps, levator on R primarily PA central glides spinous processes T 1 to T 4 very tender localized  TREATMENT DATE:  07/09/23 NuStep L3 x 5' BUE/BLE MFR suboccipital release to paracervicals, STM paracervicals, deep pressure Tp release to the R UT and levator Supine Self massage with lacrosse ball with cervical nods and rotations x 3' Self massage with massage wand to R and L UT/levator Tp's with instruction for deep pressure Supine cervical retraction Supine scapular depression Supine thoracic retraction Supine cervical extension towel mobilizations x 10 all levels Standing forehead to wall shoulder extension BUE x 20 Attempted Standing at wall shoulder HABD BUE, but increased neck pain so withheld  Hot pack/Supine electrical stimulation interferential current at machine default settings to Bilateral C-spine and upper T spine at intensity to patient tolerance x 15' for pain relief   06/20/23 STM to B UT, LS, cervical paraspinals UT stretch x 30 B LS stretch x 30 B Self snag with  06/12/23:  Somewhat abbreviated due to later start: Supine for gentle bouts of intermittent cervical traction Supine for contract/relax for R upper cervical rotation with some improvement in movement noted Attempted isometric cervical rot in supine with increase in pain Added 2 I pieces of kinesiotape middle traps region pulling from center laterally to assist with upper and middle traps function/ pain relief   PATIENT EDUCATION: Education details: POC, goals Person educated: Patient Education method: Explanation, Demonstration, Tactile cues, and Verbal cues Education  comprehension: verbalized understanding, returned demonstration, verbal cues required, tactile cues required, and needs further education  HOME EXERCISE PROGRAM: Access Code: [email or web address] URL: [email or web address] Date: 06/20/2023 Prepared by: Braylin Clark  Exercises - Seated Upper Trapezius Stretch  - 1 x daily - 7 x weekly - 3 sets - 3 reps - 30 sec hold - Gentle Levator Scapulae Stretch  - 1 x daily - 7 x weekly - 3 sets - 3 reps - 30 sec hold - Mid-Lower Cervical Extension SNAG with Strap  - 1 x daily - 7 x weekly - 3 sets - 10 reps - 3- 5 sec hold  ASSESSMENT:  CLINICAL IMPRESSION: Pt reports resolution of radicular pain today, so we did not do mechanical traction. We did work on manual techniques to improve tension in her neck and shoulders, which was pretty TTP on R. Added stretches for the neck and put into HEP for gentle mobilization. Pt responded to treatment.  Eval: Patient is a 43 y.o. female who was evaluated today for physical therapy for R sided cervical radiculopathy.  She is quite inflamed, irritable with provocation of sx/ radicular sx with very light, minimal palpation and movement.  Did respond well to manual gentle cervical traction, less so to attempted therex.  Needs gentle approach initially assisting with inflammation then progressing to strengthening. Also she is primary caregiver for her wheelchair bound mother, probably not able to get good rest/ sleep .   OBJECTIVE IMPAIRMENTS: decreased knowledge of condition, decreased ROM, decreased strength, increased edema, increased fascial restrictions, increased muscle spasms, impaired flexibility, and pain.   ACTIVITY LIMITATIONS: carrying, lifting, sleeping, reach over head, and caring for others  PARTICIPATION LIMITATIONS: meal prep, cleaning, laundry, driving, and community activity  PERSONAL FACTORS: Behavior pattern, Fitness, Past/current experiences, Social background, Time since onset of  injury/illness/exacerbation, and 1-2 comorbidities: anemia,caregiver for elderly parent are also affecting patient's functional outcome.   REHAB POTENTIAL: Good  CLINICAL DECISION MAKING: Evolving/moderate complexity  EVALUATION COMPLEXITY: Moderate   GOALS: Goals reviewed with patient? Yes  SHORT TERM GOALS: Target date: 2 weeks 06/25/23  I HEP Baseline: Goal status: INITIAL   LONG TERM GOALS:  Target date: 8 weeks, 08/07/23  Improve NDI score 35/50 or 70% to 25 /5 or 50% or less disability Baseline:  Goal status: INITIAL  2.  Improve R UE strength for grasp, wrist ext, triceps, biceps to wnl Baseline: 4-/5 Goal status: INITIAL  3.  Improve upper cervical spine R Rotation to wnl Baseline: 50% with cervical flex/ rot test Goal status: INITIAL   PLAN:  PT FREQUENCY: 1-2x/week  PT DURATION: 8 weeks  PLANNED INTERVENTIONS: 97110-Therapeutic exercises, 97530- Therapeutic activity, [id number]- Neuromuscular re-education, 97535- Self Care, [id number]- Manual therapy, 97012- Traction (mechanical), [id number]- Ionotophoresis 4mg /ml Dexamethasone, and Joint mobilization  PLAN FOR NEXT SESSION: initiate mechanical cervical traction, manual techniques gentle stretching c spine, isometrics c spine   Kaelynn Igo, PT 07/09/2023, 1:12 PM

## 2023-07-11 ENCOUNTER — Ambulatory Visit: Admitting: Rehabilitation

## 2023-07-11 ENCOUNTER — Encounter: Payer: Self-pay | Admitting: Family Medicine

## 2023-07-12 ENCOUNTER — Ambulatory Visit
Admission: RE | Admit: 2023-07-12 | Discharge: 2023-07-12 | Disposition: A | Discharge status: Home / Self Care | Source: Ambulatory Visit | Attending: Physician Assistant | Admitting: Physician Assistant

## 2023-07-12 ENCOUNTER — Ambulatory Visit: Payer: Self-pay

## 2023-07-12 ENCOUNTER — Other Ambulatory Visit: Payer: Self-pay

## 2023-07-12 VITALS — BP 133/89 | HR 73 | Temp 98.2°F | Resp 17 | Ht 66.0 in | Wt 180.0 lb

## 2023-07-12 DIAGNOSIS — G44209 Tension-type headache, unspecified, not intractable: Secondary | ICD-10-CM | POA: Diagnosis not present

## 2023-07-12 MED ORDER — METHOCARBAMOL 500 MG PO TABS: 500.0000 mg | ORAL_TABLET | Freq: Two times a day (BID) | ORAL | 0 refills | Status: DC

## 2023-07-12 MED ORDER — TRIAMCINOLONE ACETONIDE 40 MG/ML IJ SUSP
40.0000 mg | Freq: Once | INTRAMUSCULAR | Status: AC
  Administered 2023-07-12: 40 mg via INTRAMUSCULAR

## 2023-07-12 NOTE — ED Triage Notes (Addendum)
 Pt presents with a chief complaint of excruciating headache x 1 week. Pt states she is not able to work or drive due to the pain. Currently rates her overall head pain an 8/10. OTC Advil  + Aleve  taken with no relief. Describes as sharp pains. Pain is radiating down neck and into both eyes.

## 2023-07-12 NOTE — ED Provider Notes (Signed)
 [facility]    CSN: [id number] Arrival date & time: 07/12/23  1828      History   Chief Complaint Chief Complaint  Patient presents with   Headache    Entered by patient    HPI Kari Gallagher is a 43 y.o. female.   HPI  Pt reports excruciating headache pain for the past week She states she has not been able to sleep due to pain  She reports she has been going to PT for degenerative disc disease and she states she has had some increased inflammation in the neck and radiating up into her head  She has tried reaching out to her PCP office and they recommended coming to UC for evaluation prior to making an apt  She reports her head hurts along the occipital area and radiates up along the left side of her head into the temple. She states her right side hurts as well but not as severely as the left She also reports pain behind her eyes and photophobia. She states she is having some lacrimation from her eyes. She reports some unilateral rhinorrhea as well.   She reports increased left upper back and shoulder pain which is radiating into the neck and posterior head   Interventions: aleve , naproxen    Past Medical History:  Diagnosis Date   Frequent headaches    Hypothyroidism    Iron deficiency anemia    Leaky heart valve     Patient Active Problem List   Diagnosis Date Noted   Intestinal metaplasia of stomach 02/27/2023   Iron deficiency anemia 03/07/2022   Breast pain 11/21/2021   Vitamin D  insufficiency 08/25/2020   Chondromalacia patellae 04/11/2020   Hirsutism 10/20/2019   Lymphedema of both lower extremities 06/23/2019   Graves disease 02/03/2019   Neck pain 08/11/2018   Migraine with aura and with status migrainosus, not intractable 03/26/2016   Peripheral edema 03/26/2016   Pre-diabetes 01/14/2016   Insomnia 12/07/2015   Major depressive disorder, recurrent, moderate (HCC) 10/03/2015   Hypothyroidism 08/22/2015   Vitamin D  deficiency 08/22/2015     Past Surgical History:  Procedure Laterality Date   BIOPSY  02/27/2023   Procedure: BIOPSY;  Surgeon: Therisa Bi, MD;  [facility] END[facility]COPY;  Service: Gastroenterology;;   CHOLECYSTECTOMY     COLONOSCOPY WITH PROPOFOL  N/A 07/26/2022   Procedure: COLONOSCOPY WITH PROPOFOL ;  Surgeon: Unk Corinn Skiff, MD;  Location: [facility]ERY [facility];  Service: Endoscopy;  Laterality: N/A;   ESOPHAGOGASTRODUODENOSCOPY (EGD) WITH PROPOFOL  N/A 07/26/2022   Procedure: ESOPHAGOGASTRODUODENOSCOPY (EGD) WITH PROPOFOL ;  Surgeon: Unk Corinn Skiff, MD;  Location: [facility]ERY [facility];  Service: Endoscopy;  Laterality: N/A;   ESOPHAGOGASTRODUODENOSCOPY (EGD) WITH PROPOFOL  N/A 02/27/2023   Procedure: ESOPHAGOGASTRODUODENOSCOPY (EGD) WITH PROPOFOL ;  Surgeon: Therisa Bi, MD;  Location: [facility] ENDOSCOPY;  Service: Gastroenterology;  Laterality: N/A;   TUBAL LIGATION  2006    OB History   No obstetric history on file.      Home Medications    Prior to Admission medications   Medication Sig Start Date End Date Taking? Authorizing Provider  methocarbamol  (ROBAXIN ) 500 MG tablet Take 1 tablet (500 mg total) by mouth 2 (two) times daily. 07/12/23  Yes Markella Dao E, PA-C  baclofen  (LIORESAL ) 10 MG tablet Take 0.5-1 tablets (5-10 mg total) by mouth 3 (three) times daily as needed for muscle spasms. 04/12/23   Karamalegos, Marsa PARAS, DO  cetirizine (ZYRTEC) 10 MG tablet Take 10 mg by mouth daily. 01/06/23   [provider]  cyanocobalamin  (VITAMIN B12) 1000 MCG tablet Take 1 tablet (1,000 mcg total) by mouth daily. 06/20/22   Therisa Bi, MD  levothyroxine  (SYNTHROID ) 125 MCG tablet Take 1 tablet (125 mcg total) by mouth daily. 11/22/22   Karamalegos, Marsa PARAS, DO  Multiple Vitamin (MULTIVITAMIN WITH MINERALS) TABS tablet Take 1 tablet by mouth daily.    [provider]  naproxen  (NAPROSYN ) 500 MG tablet Take 1 tablet (500 mg total) by mouth 2 (two) times daily with a meal. For 2-4 weeks then as  needed 04/12/23   Edman Marsa PARAS, DO  traZODone  (DESYREL ) 100 MG tablet Take 1 tablet (100 mg total) by mouth at bedtime as needed for sleep. 03/05/22   Karamalegos, Marsa PARAS, DO  Vitamin D , Ergocalciferol , (DRISDOL ) 1.25 MG (50000 UNIT) CAPS capsule Take 1 capsule (50,000 Units total) by mouth once a week. For 12 weeks. Then start OTC Vitamin D3 2,000 unit daily. 11/26/22   Karamalegos, Marsa PARAS, DO    Family History Family History  Problem Relation Age of Onset   Stroke Mother    Depression Mother    Mental illness Mother    Heart disease Father    Thyroid  disease Father    Lung cancer Father    Thyroid  disease Brother     Social History Social History   Tobacco Use   Smoking status: Every Day    Current packs/day: 0.50    Types: Cigarettes   Smokeless tobacco: Former  Building services engineer status: Never Used  Substance Use Topics   Alcohol use: No   Drug use: No     Allergies   Patient has no known allergies.   Review of Systems Review of Systems  Eyes:  Positive for photophobia.  Gastrointestinal:  Negative for diarrhea and vomiting.  Musculoskeletal:  Positive for back pain, myalgias and neck pain.  Neurological:  Positive for light-headedness and headaches. Negative for dizziness, tremors, syncope, facial asymmetry, speech difficulty and weakness.  Psychiatric/Behavioral:  Negative for confusion.      Physical Exam Triage Vital Signs ED Triage Vitals  Encounter Vitals Group     BP 07/12/23 1840 133/89     Girls Systolic BP Percentile --      Girls Diastolic BP Percentile --      Boys Systolic BP Percentile --      Boys Diastolic BP Percentile --      Pulse Rate 07/12/23 1840 73     Resp 07/12/23 1840 17     Temp 07/12/23 1840 98.2 F (36.8 C)     Temp Source 07/12/23 1840 Oral     SpO2 07/12/23 1840 98 %     Weight 07/12/23 1838 180 lb (81.6 kg)     Height 07/12/23 1838 5' 6 (1.676 m)     Head Circumference --      Peak Flow --       Pain Score 07/12/23 1836 8     Pain Loc --      Pain Education --      Exclude from Growth Chart --    No data found.  Updated Vital Signs BP 133/89 (BP Location: Right Arm)   Pulse 73   Temp 98.2 F (36.8 C) (Oral)   Resp 17   Ht 5' 6 (1.676 m)   Wt 180 lb (81.6 kg)   LMP 07/10/2023 (Exact Date)   SpO2 98%   BMI 29.05 kg/m   Visual Acuity Right Eye Distance:  Left Eye Distance:   Bilateral Distance:    Right Eye Near:   Left Eye Near:    Bilateral Near:     Physical Exam Vitals reviewed.  Constitutional:      General: She is awake. She is not in acute distress.    Appearance: Normal appearance. She is well-developed and well-groomed. She is not ill-appearing or toxic-appearing.  HENT:     Head: Normocephalic and atraumatic.  Eyes:     General: Lids are normal. Gaze aligned appropriately.     Extraocular Movements: Extraocular movements intact.     Conjunctiva/sclera: Conjunctivae normal.     Pupils: Pupils are equal, round, and reactive to light.  Pulmonary:     Effort: Pulmonary effort is normal.  Musculoskeletal:     Cervical back: Normal range of motion and neck supple. Pain with movement and muscular tenderness present. Normal range of motion.  Neurological:     General: No focal deficit present.     Mental Status: She is alert and oriented to person, place, and time.     GCS: GCS eye subscore is 4. GCS verbal subscore is 5. GCS motor subscore is 6.     Cranial Nerves: No cranial nerve deficit, dysarthria or facial asymmetry.     Motor: No weakness, tremor, atrophy or abnormal muscle tone.     Coordination: Heel to Shin Test normal.     Comments: Comments: MENTAL STATUS: AAOx3, memory intact, fund of knowledge appropriate   LANG/SPEECH: Naming and repetition intact, fluent, no dysarthria, follows 3-step commands, answers questions appropriately     CRANIAL NERVES:   II: Pupils equal and reactive, no RAPD   III, IV, VI: EOM intact, no gaze preference  or deviation, no nystagmus.   V: normal sensation in V1, V2, and V3 segments bilaterally   VII: no asymmetry, no nasolabial fold flattening   VIII: normal hearing to speech   IX, X: normal palatal elevation, no uvular deviation   XI: 5/5 head turn and 5/5 shoulder shrug bilaterally   XII: midline tongue protrusion   MOTOR:  5/5 bilateral grip strength 5/5 strength dorsiflexion/plantarflexion b/l  COORD: Normal  heel to shin, no tremor, no dysmetria   GAIT: Normal; patient able to tip-toe, heel-walk.   Psychiatric:        Attention and Perception: Attention and perception normal.        Mood and Affect: Mood and affect normal.        Speech: Speech normal.        Behavior: Behavior normal. Behavior is cooperative.      UC Treatments / Results  Labs (all labs ordered are listed, but only abnormal results are displayed) Labs Reviewed - No data to display  EKG   Radiology No results found.  Procedures Procedures (including critical care time)  Medications Ordered in UC Medications  triamcinolone  acetonide (KENALOG -40) injection 40 mg (has no administration in time range)    Initial Impression / Assessment and Plan / UC Course  I have reviewed the triage vital signs and the nursing notes.  Pertinent labs & imaging results that were available during my care of the patient were reviewed by me and considered in my medical decision making (see chart for details).     Attempted high flow O2 via nasal cannula for potential cluster headache given lacrimation and rhinorrhea, which seemed to assist with pain behind the eyes and temple pain. She reports the remaining headache is still present though.   Final  Clinical Impressions(s) / UC Diagnoses   Final diagnoses:  Tension headache     Discharge Instructions      Based on your symptoms it appears you have a tension-type headache  The goal of treatment is to manage your symptoms and prevent them from recurring.    Symptomatic relief is usually achieved with NSAIDs rather than Tylenol  as this type of headache is related to the muscle tissue in the head being inflamed and irritated.   The following OTC medications can help Choose ONE of  the following to take to help after headache pain begins  Ibuprofen  400-600mg   Naproxen  220-550 mg  Aspirin 500-650 mg   A single dose per day of the above medications should be sufficient to help with symptoms  Further actions to help with symptoms include: Warm compress to neck and shoulders Gentle massage  Rest  Relaxation techniques such as breath work, meditation, Stretches to reduce stiffness  Practicing correct posture to reduce neck and shoulder strain  If these symptoms do not start to improve or worsen over the next 2 weeks please make a follow up appointment so we can evaluate this further  Go to the ER if you experience any of the following symptoms: Sudden onset headache with severe pain Vision changes Difficulty speaking or weakness with movement Pregnancy  with headaches and elevated blood pressure Signs of serious infection   Do not take the Robaxin  with the Baclofen . These are both muscle relaxers and taking them together can cause increased sedation and respiratory depression.      ED Prescriptions     Medication Sig Dispense Auth. Provider   methocarbamol  (ROBAXIN ) 500 MG tablet Take 1 tablet (500 mg total) by mouth 2 (two) times daily. 20 tablet Orvis Stann E, PA-C      PDMP not reviewed this encounter.

## 2023-07-12 NOTE — Telephone Encounter (Signed)
 FYI Only or Action Required?: FYI only for provider.  Patient was last seen in primary care on 04/12/2023 by Edman Marsa PARAS, DO.  Called Nurse Triage reporting Headache and Eye Pain.  Symptoms began a week ago.  Interventions attempted: OTC medications: aleve  without relief.  Symptoms are: unchanged.  Triage Disposition: See HCP Within 4 Hours (Or PCP Triage)  Patient/caregiver understands and will follow disposition?: Yes     Copied from CRM [id number]. Topic: Clinical - Red Word Triage >> Jul 12, 2023  4:56 PM Winona R wrote: Pt has excruciating pain in behind her eyes and head. Was going to go to er but doesn't have anyone to take her. Dr. MARLA CMA responded in [place] for pt to scheduled an appointment to talk about a few things involving pts concerns. Reason for Disposition  [1] SEVERE headache (e.g., excruciating) AND [2] not improved after 2 hours of pain medicine  Answer Assessment - Initial Assessment Questions 1. LOCATION: Where does it hurt?      L sided H/A - behind my neck going up to L side 2. ONSET: When did the headache start? (e.g., minutes, hours, days)      X 1 week Endorses going to PT and PT reported inflammation going up neck and toward head 3. PATTERN: Does the pain come and go, or has it been constant since it started?     Comes and goes 4. SEVERITY: How bad is the pain? and What does it keep you from doing?  (e.g., Scale 1-10; mild, moderate, or severe)     9/10 Pt reports taking aleve  with no relief 5. RECURRENT SYMPTOM: Have you ever had headaches before? If Yes, ask: When was the last time? and What happened that time?      denies 6. CAUSE: What do you think is causing the headache?     I feel like its all connected.. to my spine - has hx of degenerative disc disease (?) 7. MIGRAINE: Have you been diagnosed with migraine headaches? If Yes, ask: Is this headache similar?      Yes, years ago 8. HEAD INJURY: Has there  been any recent injury to your head?      denies 9. OTHER SYMPTOMS: Do you have any other symptoms? (e.g., fever, stiff neck, eye pain, sore throat, cold symptoms)     Back pain, H/A unbearable, eye pain Able to touch chin to chest 10. PREGNANCY: Is there any chance you are pregnant? When was your last menstrual period?       denies    [facility]ager scheduled [facility] appt.  Protocols used: [facility]

## 2023-07-12 NOTE — Discharge Instructions (Addendum)
 Based on your symptoms it appears you have a tension-type headache  The goal of treatment is to manage your symptoms and prevent them from recurring.   Symptomatic relief is usually achieved with NSAIDs rather than Tylenol  as this type of headache is related to the muscle tissue in the head being inflamed and irritated.   The following OTC medications can help Choose ONE of  the following to take to help after headache pain begins  Ibuprofen  400-600mg   Naproxen  220-550 mg  Aspirin 500-650 mg   A single dose per day of the above medications should be sufficient to help with symptoms  Further actions to help with symptoms include: Warm compress to neck and shoulders Gentle massage  Rest  Relaxation techniques such as breath work, meditation, Stretches to reduce stiffness  Practicing correct posture to reduce neck and shoulder strain  If these symptoms do not start to improve or worsen over the next 2 weeks please make a follow up appointment so we can evaluate this further  Go to the ER if you experience any of the following symptoms: Sudden onset headache with severe pain Vision changes Difficulty speaking or weakness with movement Pregnancy  with headaches and elevated blood pressure Signs of serious infection   Do not take the Robaxin  with the Baclofen . These are both muscle relaxers and taking them together can cause increased sedation and respiratory depression.

## 2023-07-15 ENCOUNTER — Ambulatory Visit: Payer: Self-pay

## 2023-07-15 NOTE — Telephone Encounter (Signed)
 Will discuss at upcoming appt.

## 2023-07-15 NOTE — Telephone Encounter (Signed)
 No further action needed at this time.

## 2023-07-15 NOTE — Telephone Encounter (Signed)
 FYI Only or Action Required?: FYI only for provider.  Patient was last seen in primary care on 04/12/2023 by Edman Marsa PARAS, DO.  Called Nurse Triage reporting Headache.  Symptoms began a week ago.  Interventions attempted: Prescription medications: Robaxin , Naproxen .  Symptoms are: unchanged.  Triage Disposition: See PCP When Office is Open (Within 3 Days); Treated over the weekend at [facility] with steroid shot and muscle relaxers. Says that the muscle relaxer helps a little but doesn't get rid of the pain.   Patient/caregiver understands and will follow disposition?: Yes     Copied from CRM [id number]. Topic: Clinical - Red Word Triage >> Jul 15, 2023  2:27 PM Powell HERO wrote: Red Word that prompted transfer to Nurse Triage: Severe headache, pressure and pulsing in her head. Ongoing for a couple days. Wants to see Dr MARLA Reason for Disposition  [1] MILD-MODERATE headache AND [2] present > 3 days (72 hours) AND [3] no improvement after using Care Advice  Answer Assessment - Initial Assessment Questions 1. LOCATION: Where does it hurt?      Back of head, on the side, behind eyes  2. ONSET: When did the headache start? (e.g., minutes, hours, days)      Just over a week  3. PATTERN: Does the pain come and go, or has it been constant since it started?     Comes and goes  4. SEVERITY: How bad is the pain? and What does it keep you from doing?  (e.g., Scale 1-10; mild, moderate, or severe)     *No Answer* 5. RECURRENT SYMPTOM: Have you ever had headaches before? If Yes, ask: When was the last time? and What happened that time?      *No Answer* 6. CAUSE: What do you think is causing the headache?     Think it may be related to inflammation of disc  7. MIGRAINE: Have you been diagnosed with migraine headaches? If Yes, ask: Is this headache similar?      No  8. HEAD INJURY: Has there been any recent injury to your head?      No  9. OTHER SYMPTOMS: Do you  have any other symptoms? (e.g., fever, stiff neck, eye pain, sore throat, cold symptoms)     *No Answer* 10. PREGNANCY: Is there any chance you are pregnant? When was your last menstrual period?       *No Answer*  Protocols used: Headache-A-AH

## 2023-07-19 ENCOUNTER — Ambulatory Visit

## 2023-07-19 NOTE — Telephone Encounter (Signed)
 This is a chronic issue and she really needs to see her PCP for this.  She did no-show her appointment today.
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
## 2023-07-23 ENCOUNTER — Encounter

## 2023-07-25 ENCOUNTER — Ambulatory Visit

## 2023-07-30 ENCOUNTER — Ambulatory Visit

## 2023-08-01 ENCOUNTER — Encounter

## 2023-08-19 ENCOUNTER — Ambulatory Visit: Admitting: Family Medicine

## 2023-08-19 ENCOUNTER — Encounter: Payer: Self-pay | Admitting: Family Medicine

## 2023-08-19 VITALS — BP 140/80 | Ht 66.0 in | Wt 180.0 lb

## 2023-08-19 DIAGNOSIS — E559 Vitamin D deficiency, unspecified: Secondary | ICD-10-CM

## 2023-08-19 DIAGNOSIS — D649 Anemia, unspecified: Secondary | ICD-10-CM | POA: Diagnosis not present

## 2023-08-19 DIAGNOSIS — E890 Postprocedural hypothyroidism: Secondary | ICD-10-CM

## 2023-08-19 DIAGNOSIS — F411 Generalized anxiety disorder: Secondary | ICD-10-CM | POA: Diagnosis not present

## 2023-08-19 DIAGNOSIS — F410 Panic disorder [episodic paroxysmal anxiety] without agoraphobia: Secondary | ICD-10-CM

## 2023-08-19 DIAGNOSIS — E538 Deficiency of other specified B group vitamins: Secondary | ICD-10-CM | POA: Diagnosis not present

## 2023-08-19 DIAGNOSIS — R202 Paresthesia of skin: Secondary | ICD-10-CM

## 2023-08-19 DIAGNOSIS — F3342 Major depressive disorder, recurrent, in full remission: Secondary | ICD-10-CM

## 2023-08-19 DIAGNOSIS — F17200 Nicotine dependence, unspecified, uncomplicated: Secondary | ICD-10-CM

## 2023-08-19 DIAGNOSIS — G4701 Insomnia due to medical condition: Secondary | ICD-10-CM

## 2023-08-19 DIAGNOSIS — R030 Elevated blood-pressure reading, without diagnosis of hypertension: Secondary | ICD-10-CM

## 2023-08-19 MED ORDER — VARENICLINE TARTRATE (STARTER) 0.5 MG X 11 & 1 MG X 42 PO TBPK: ORAL_TABLET | ORAL | 0 refills | Status: AC

## 2023-08-19 MED ORDER — TRAZODONE HCL 100 MG PO TABS: 100.0000 mg | ORAL_TABLET | Freq: Every evening | ORAL | 1 refills | Status: AC | PRN

## 2023-08-19 MED ORDER — BACLOFEN 10 MG PO TABS: 5.0000 mg | ORAL_TABLET | Freq: Three times a day (TID) | ORAL | 5 refills | Status: AC | PRN
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
## 2023-08-19 NOTE — Patient Instructions (Addendum)
 No new BP medication at this time. We will monitor it going forward, contact us  if you still have higher Bp readings >140 on top number. Hopefully it was just triggered by stressors.  [facility] orders for Thursday  Refilled muscle relaxant, Trazodone   Varenicline  - Chantix  Dosing: Duration Dosage  Initial 3 days  0.5mg  daily  Days 4-7 0.5mg  twice daily  Day 8 through the end of therapy  1mg  twice daily   Prescribing Instructions: Use of "Chantix  - Starting Month Pak" and "Chantix  - Continuing Month Pak" provide easy to use blister packaging.  Black Box Warning: Mood Change -"Serious neuropsychiatric events have been reported." Stop Drug and contact health care provider if patient. "agitation, hostility, depressed mood or changes in thinking or behavior that are not typical for the patient are observed, or if the patient develops suicidal ideation / behavior." Document potential mood change.  Patients can continue to smoke while initiating varenicline . A "target quit date" should be set on calendar approximately Day 8 to max Day 30 (of the treatment) - PREFERRED QUIT DATE - 1 to 2 weeks from start of medicine. Advise to take this medication WITH food.  Minimizing the nausea (typically mild and transient).  If nausea occurs, consider dose reduction or temporary cessation of treatment.   The treatment should be continued for 12 weeks.  An additional 12 weeks of therapy can be used at the prescribers discretion.   Chantix  can change the way people react to alcohol (decreased tolerance, increased drunkenness, unusual or aggressive behavior, memory loss) Seizures occurred in patients that had no history of seizures and in patients that had a seizure disorder that had been well controlled  Discuss Cardiovascular Safety   Please schedule a Follow-up Appointment to: No follow-ups on file.  If you have any other questions or concerns, please feel free to call the office or send a message through  MyChart. You may also schedule an earlier appointment if necessary.  Additionally, you may be receiving a survey about your experience at our office within a few days to 1 week by e-mail or mail. We value your feedback.  Marsa Officer, DO [facility], [place]

## 2023-08-21 ENCOUNTER — Inpatient Hospital Stay: Payer: Medicaid Other | Attending: Family Medicine

## 2023-08-21 ENCOUNTER — Inpatient Hospital Stay: Payer: Medicaid Other | Admitting: Oncology

## 2023-10-07 ENCOUNTER — Telehealth: Payer: Self-pay | Admitting: Oncology

## 2023-10-07 NOTE — Telephone Encounter (Signed)
 Pt called to r/s her missed appts from 8/20.  Appts have been r/s to 10/17.

## 2023-10-11 DIAGNOSIS — E538 Deficiency of other specified B group vitamins: Secondary | ICD-10-CM | POA: Diagnosis not present

## 2023-10-11 DIAGNOSIS — E890 Postprocedural hypothyroidism: Secondary | ICD-10-CM | POA: Diagnosis not present

## 2023-10-12 LAB — VITAMIN D 25 HYDROXY (VIT D DEFICIENCY, FRACTURES): Vit D, 25-Hydroxy: 18.4 ng/mL — ABNORMAL LOW (ref 30.0–100.0)

## 2023-10-12 LAB — T4, FREE: Free T4: 0.46 ng/dL — ABNORMAL LOW (ref 0.82–1.77)

## 2023-10-12 LAB — VITAMIN B12: Vitamin B-12: 294 pg/mL (ref 232–1245)

## 2023-10-12 LAB — TSH: TSH: 31.3 u[IU]/mL — ABNORMAL HIGH (ref 0.450–4.500)

## 2023-10-15 ENCOUNTER — Ambulatory Visit: Payer: Self-pay | Admitting: Family Medicine

## 2023-10-15 DIAGNOSIS — E538 Deficiency of other specified B group vitamins: Secondary | ICD-10-CM

## 2023-10-17 MED ORDER — CYANOCOBALAMIN 1000 MCG/ML IJ SOLN: 1000.0000 ug | INTRAMUSCULAR | 0 refills | Status: DC

## 2023-10-18 ENCOUNTER — Inpatient Hospital Stay: Attending: Family Medicine

## 2023-10-18 ENCOUNTER — Encounter: Payer: Self-pay | Admitting: Oncology

## 2023-10-18 ENCOUNTER — Inpatient Hospital Stay: Admitting: Oncology

## 2023-10-18 VITALS — BP 151/94 | HR 78 | Temp 98.8°F | Resp 18 | Ht 66.0 in | Wt 185.6 lb

## 2023-10-18 DIAGNOSIS — D509 Iron deficiency anemia, unspecified: Secondary | ICD-10-CM

## 2023-10-18 DIAGNOSIS — F17210 Nicotine dependence, cigarettes, uncomplicated: Secondary | ICD-10-CM | POA: Diagnosis not present

## 2023-10-18 DIAGNOSIS — Z801 Family history of malignant neoplasm of trachea, bronchus and lung: Secondary | ICD-10-CM | POA: Diagnosis not present

## 2023-10-18 LAB — CBC
HCT: 38.6 % (ref 36.0–46.0)
Hemoglobin: 12.9 g/dL (ref 12.0–15.0)
MCH: 31.2 pg (ref 26.0–34.0)
MCHC: 33.4 g/dL (ref 30.0–36.0)
MCV: 93.2 fL (ref 80.0–100.0)
Platelets: 206 K/uL (ref 150–400)
RBC: 4.14 MIL/uL (ref 3.87–5.11)
RDW: 13.1 % (ref 11.5–15.5)
WBC: 5.5 K/uL (ref 4.0–10.5)
nRBC: 0 % (ref 0.0–0.2)

## 2023-10-18 LAB — IRON AND TIBC
Iron: 87 ug/dL (ref 28–170)
Saturation Ratios: 25 % (ref 10.4–31.8)
TIBC: 343 ug/dL (ref 250–450)
UIBC: 256 ug/dL

## 2023-10-18 LAB — FERRITIN: Ferritin: 222 ng/mL (ref 11–307)

## 2023-10-18 MED ORDER — SYRINGE/NEEDLE (DISP) 25G X 1" 1 ML MISC: 0 refills | Status: AC

## 2023-10-18 NOTE — Addendum Note (Signed)
 Addended by: EDMAN MARSA PARAS on: 10/18/2023 06:25 PM   Modules accepted: Orders
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
## 2023-10-18 NOTE — Progress Notes (Signed)
 Patient states she's been feeling fatigued a lot lately and that her thyroid  & B12 levels haven't been the best.

## 2023-11-12 ENCOUNTER — Other Ambulatory Visit: Payer: Self-pay | Admitting: Family Medicine

## 2023-11-12 DIAGNOSIS — E538 Deficiency of other specified B group vitamins: Secondary | ICD-10-CM

## 2023-11-14 NOTE — Telephone Encounter (Signed)
 Requested medications are due for refill today.  yes  Requested medications are on the active medications list.  yes  Last refill. 10/17/2023 4mL 0 rf  Future visit scheduled.   no  Notes to clinic.  Pt needs new rx - per instructions pt was to inject 1x per week for 1 month, then 1x monthly. Please advise.    Requested Prescriptions  Pending Prescriptions Disp Refills   cyanocobalamin  (VITAMIN B12) 1000 MCG/ML injection [Pharmacy Med Name: Cyanocobalamin  1000 MCG/ML Injection Solution] 4 mL 0    Sig: INJECT 1 ML INTRAMUSCULARLY  ONCE A WEEK     Endocrinology:  Vitamins - Vitamin B12 Passed - 11/14/2023 12:06 PM      Passed - HCT in normal range and within 360 days    HCT  Date Value Ref Range Status  10/18/2023 38.6 36.0 - 46.0 % Final   Hematocrit  Date Value Ref Range Status  11/08/2021 37.6 34.0 - 46.6 % Final         Passed - HGB in normal range and within 360 days    Hemoglobin  Date Value Ref Range Status  10/18/2023 12.9 12.0 - 15.0 g/dL Final  87/79/7975 87.3 12.0 - 15.0 g/dL Final  88/91/7976 88.2 11.1 - 15.9 g/dL Final         Passed - B12 Level in normal range and within 360 days    Vitamin B-12  Date Value Ref Range Status  10/11/2023 294 232 - 1,245 pg/mL Final         Passed - Valid encounter within last 12 months    Recent Outpatient Visits           2 months ago Normocytic anemia   [place] [facility] [place] Marsa PARAS, DO   7 months ago Paresthesia of right upper extremity   [place] [facility] [place], Marsa PARAS, [place]

## 2023-12-06 ENCOUNTER — Telehealth: Admitting: Family Medicine

## 2023-12-06 DIAGNOSIS — N760 Acute vaginitis: Secondary | ICD-10-CM | POA: Diagnosis not present

## 2023-12-06 DIAGNOSIS — B3731 Acute candidiasis of vulva and vagina: Secondary | ICD-10-CM

## 2023-12-06 DIAGNOSIS — B9689 Other specified bacterial agents as the cause of diseases classified elsewhere: Secondary | ICD-10-CM

## 2023-12-06 MED ORDER — FLUCONAZOLE 150 MG PO TABS: 150.0000 mg | ORAL_TABLET | Freq: Every day | ORAL | 0 refills | Status: AC

## 2023-12-06 MED ORDER — METRONIDAZOLE 500 MG PO TABS: 500.0000 mg | ORAL_TABLET | Freq: Two times a day (BID) | ORAL | 0 refills | Status: AC

## 2023-12-06 NOTE — Progress Notes (Signed)

## 2024-01-29 ENCOUNTER — Telehealth

## 2024-01-29 DIAGNOSIS — R3989 Other symptoms and signs involving the genitourinary system: Secondary | ICD-10-CM | POA: Diagnosis not present

## 2024-01-30 MED ORDER — CEPHALEXIN 500 MG PO CAPS: 500.0000 mg | ORAL_CAPSULE | Freq: Two times a day (BID) | ORAL | 0 refills | Status: AC

## 2024-01-30 MED ORDER — FLUCONAZOLE 150 MG PO TABS: ORAL_TABLET | ORAL | 0 refills | Status: AC

## 2024-01-30 NOTE — Progress Notes (Signed)
 We are sorry that you are not feeling well.  Here is how we plan to help!  Based on what you shared with me it looks like you most likely have a simple urinary tract infection.  A UTI (Urinary Tract Infection) is a bacterial infection of the bladder.  Most cases of urinary tract infections are simple to treat but a key part of your care is to encourage you to drink plenty of fluids and watch your symptoms carefully.  I have prescribed Keflex  500 mg twice a day for 7 days.  Your symptoms should gradually improve. Call us  if the burning in your urine worsens, you develop worsening fever, back pain or pelvic pain or if your symptoms do not resolve after completing the antibiotic. I have also sent in a Diflucan  in case of yeast from antibiotic use.   Urinary tract infections can be prevented by drinking plenty of water  to keep your body hydrated.  Also be sure when you wipe, wipe from front to back and don't hold it in!  If possible, empty your bladder every 4 hours.  Your e-visit answers were reviewed by a board certified advanced clinical practitioner to complete your personal care plan.  Depending on the condition, your plan could have included both over the counter or prescription medications.  If there is a problem please reply  once you have received a response from your provider.  Your safety is important to us .  If you have drug allergies check your prescription carefully.    You can use MyChart to ask questions about todays visit, request a non-urgent call back, or ask for a work or school excuse for 24 hours related to this e-Visit. If it has been greater than 24 hours you will need to follow up with your provider, or enter a new e-Visit to address those concerns.   You will get an e-mail in the next two days asking about your experience.  I hope that your e-visit has been valuable and will speed your recovery. Thank you for using e-visits.  I have spent 5 minutes in review of e-visit  questionnaire, review and updating patient chart, medical decision making and response to patient.   Elsie Velma Lunger, PA-C

## 2024-02-18 ENCOUNTER — Inpatient Hospital Stay

## 2024-06-17 ENCOUNTER — Inpatient Hospital Stay

## 2024-06-17 ENCOUNTER — Inpatient Hospital Stay: Admitting: Oncology
